# Patient Record
Sex: Female | Born: 1980 | Race: White | Hispanic: No | Smoking: Former smoker
Health system: Southern US, Community
[De-identification: ages and names within clinical notes are randomized; demographics above are authoritative.]

## PROBLEM LIST (undated history)

## (undated) DIAGNOSIS — E669 Obesity, unspecified: Secondary | ICD-10-CM

## (undated) DIAGNOSIS — E079 Disorder of thyroid, unspecified: Secondary | ICD-10-CM

## (undated) DIAGNOSIS — R87629 Unspecified abnormal cytological findings in specimens from vagina: Secondary | ICD-10-CM

## (undated) DIAGNOSIS — F32A Depression, unspecified: Secondary | ICD-10-CM

## (undated) DIAGNOSIS — F329 Major depressive disorder, single episode, unspecified: Secondary | ICD-10-CM

## (undated) DIAGNOSIS — M1990 Unspecified osteoarthritis, unspecified site: Secondary | ICD-10-CM

## (undated) DIAGNOSIS — M549 Dorsalgia, unspecified: Secondary | ICD-10-CM

## (undated) DIAGNOSIS — N390 Urinary tract infection, site not specified: Secondary | ICD-10-CM

## (undated) HISTORY — PX: NASAL SINUS SURGERY: SHX719

## (undated) HISTORY — DX: Urinary tract infection, site not specified: N39.0

## (undated) HISTORY — PX: MOUTH SURGERY: SHX715

## (undated) HISTORY — DX: Unspecified abnormal cytological findings in specimens from vagina: R87.629

## (undated) HISTORY — PX: INCISION AND DRAINAGE ABSCESS: SHX5864

## (undated) HISTORY — DX: Obesity, unspecified: E66.9

## (undated) HISTORY — PX: TUBAL LIGATION: SHX77

## (undated) HISTORY — PX: ADENOIDECTOMY: SUR15

## (undated) HISTORY — PX: TONSILLECTOMY: SUR1361

## (undated) HISTORY — DX: Major depressive disorder, single episode, unspecified: F32.9

## (undated) HISTORY — DX: Depression, unspecified: F32.A

---
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
## 2000-09-11 ENCOUNTER — Other Ambulatory Visit: Admission: RE | Admit: 2000-09-11 | Discharge: 2000-09-11 | Payer: Self-pay | Admitting: Obstetrics

## 2000-09-11 ENCOUNTER — Encounter (INDEPENDENT_AMBULATORY_CARE_PROVIDER_SITE_OTHER): Payer: Self-pay

## 2002-05-21 ENCOUNTER — Emergency Department (HOSPITAL_COMMUNITY): Admission: EM | Admit: 2002-05-21 | Discharge: 2002-05-21 | Payer: Self-pay

## 2002-09-13 ENCOUNTER — Encounter: Admission: RE | Admit: 2002-09-13 | Discharge: 2002-09-13 | Payer: Self-pay | Admitting: Family Medicine

## 2002-10-10 ENCOUNTER — Encounter: Admission: RE | Admit: 2002-10-10 | Discharge: 2002-10-10 | Payer: Self-pay | Admitting: Family Medicine

## 2002-10-21 ENCOUNTER — Encounter: Admission: RE | Admit: 2002-10-21 | Discharge: 2002-10-21 | Payer: Self-pay | Admitting: Sports Medicine

## 2002-11-01 ENCOUNTER — Encounter: Payer: Self-pay | Admitting: *Deleted

## 2002-11-01 ENCOUNTER — Inpatient Hospital Stay (HOSPITAL_COMMUNITY): Admission: AD | Admit: 2002-11-01 | Discharge: 2002-11-01 | Payer: Self-pay | Admitting: *Deleted

## 2002-11-08 ENCOUNTER — Encounter: Admission: RE | Admit: 2002-11-08 | Discharge: 2002-11-08 | Payer: Self-pay | Admitting: Family Medicine

## 2003-01-09 ENCOUNTER — Encounter: Admission: RE | Admit: 2003-01-09 | Discharge: 2003-01-09 | Payer: Self-pay | Admitting: Family Medicine

## 2003-02-01 ENCOUNTER — Other Ambulatory Visit: Admission: RE | Admit: 2003-02-01 | Discharge: 2003-02-01 | Payer: Self-pay | Admitting: Family Medicine

## 2003-02-01 ENCOUNTER — Encounter (INDEPENDENT_AMBULATORY_CARE_PROVIDER_SITE_OTHER): Payer: Self-pay | Admitting: *Deleted

## 2003-02-01 ENCOUNTER — Encounter: Admission: RE | Admit: 2003-02-01 | Discharge: 2003-02-01 | Payer: Self-pay | Admitting: Family Medicine

## 2003-02-12 ENCOUNTER — Emergency Department (HOSPITAL_COMMUNITY): Admission: EM | Admit: 2003-02-12 | Discharge: 2003-02-12 | Payer: Self-pay | Admitting: Emergency Medicine

## 2003-03-28 ENCOUNTER — Encounter: Admission: RE | Admit: 2003-03-28 | Discharge: 2003-03-28 | Payer: Self-pay | Admitting: Family Medicine

## 2003-04-19 ENCOUNTER — Encounter: Admission: RE | Admit: 2003-04-19 | Discharge: 2003-04-19 | Payer: Self-pay | Admitting: Family Medicine

## 2003-04-27 ENCOUNTER — Encounter: Admission: RE | Admit: 2003-04-27 | Discharge: 2003-04-27 | Payer: Self-pay | Admitting: Family Medicine

## 2003-08-10 ENCOUNTER — Other Ambulatory Visit: Admission: RE | Admit: 2003-08-10 | Discharge: 2003-08-10 | Payer: Self-pay | Admitting: *Deleted

## 2003-08-10 ENCOUNTER — Encounter: Admission: RE | Admit: 2003-08-10 | Discharge: 2003-08-10 | Payer: Self-pay | Admitting: Family Medicine

## 2003-08-18 ENCOUNTER — Encounter: Admission: RE | Admit: 2003-08-18 | Discharge: 2003-08-18 | Payer: Self-pay | Admitting: Family Medicine

## 2003-08-22 ENCOUNTER — Encounter: Admission: RE | Admit: 2003-08-22 | Discharge: 2003-08-22 | Payer: Self-pay | Admitting: Family Medicine

## 2003-11-22 ENCOUNTER — Ambulatory Visit: Payer: Self-pay | Admitting: Family Medicine

## 2003-11-22 ENCOUNTER — Encounter (INDEPENDENT_AMBULATORY_CARE_PROVIDER_SITE_OTHER): Payer: Self-pay | Admitting: Specialist

## 2003-11-22 ENCOUNTER — Other Ambulatory Visit: Admission: RE | Admit: 2003-11-22 | Discharge: 2003-11-22 | Payer: Self-pay | Admitting: Family Medicine

## 2003-12-08 ENCOUNTER — Ambulatory Visit: Payer: Self-pay | Admitting: Sports Medicine

## 2004-04-09 ENCOUNTER — Ambulatory Visit: Payer: Self-pay | Admitting: Sports Medicine

## 2004-06-19 ENCOUNTER — Inpatient Hospital Stay (HOSPITAL_COMMUNITY): Admission: AD | Admit: 2004-06-19 | Discharge: 2004-06-19 | Payer: Self-pay | Admitting: Obstetrics and Gynecology

## 2004-09-20 ENCOUNTER — Emergency Department (HOSPITAL_COMMUNITY): Admission: EM | Admit: 2004-09-20 | Discharge: 2004-09-20 | Payer: Self-pay | Admitting: Emergency Medicine

## 2004-12-25 ENCOUNTER — Inpatient Hospital Stay (HOSPITAL_COMMUNITY): Admission: AD | Admit: 2004-12-25 | Discharge: 2004-12-25 | Payer: Self-pay | Admitting: Obstetrics and Gynecology

## 2005-01-05 ENCOUNTER — Inpatient Hospital Stay (HOSPITAL_COMMUNITY): Admission: AD | Admit: 2005-01-05 | Discharge: 2005-01-05 | Payer: Self-pay | Admitting: *Deleted

## 2005-03-03 HISTORY — PX: CERVICAL BIOPSY  W/ LOOP ELECTRODE EXCISION: SUR135

## 2005-03-10 ENCOUNTER — Inpatient Hospital Stay (HOSPITAL_COMMUNITY): Admission: AD | Admit: 2005-03-10 | Discharge: 2005-03-10 | Payer: Self-pay | Admitting: Family Medicine

## 2005-03-12 ENCOUNTER — Ambulatory Visit: Payer: Self-pay | Admitting: Family Medicine

## 2005-03-21 ENCOUNTER — Ambulatory Visit: Payer: Self-pay | Admitting: Family Medicine

## 2005-03-21 ENCOUNTER — Encounter (INDEPENDENT_AMBULATORY_CARE_PROVIDER_SITE_OTHER): Payer: Self-pay | Admitting: Specialist

## 2005-03-21 ENCOUNTER — Other Ambulatory Visit: Admission: RE | Admit: 2005-03-21 | Discharge: 2005-03-21 | Payer: Self-pay | Admitting: Family Medicine

## 2005-03-26 ENCOUNTER — Ambulatory Visit (HOSPITAL_COMMUNITY): Admission: RE | Admit: 2005-03-26 | Discharge: 2005-03-26 | Payer: Self-pay | Admitting: Family Medicine

## 2005-05-02 ENCOUNTER — Ambulatory Visit: Payer: Self-pay | Admitting: Family Medicine

## 2005-05-05 ENCOUNTER — Ambulatory Visit: Payer: Self-pay | Admitting: Family Medicine

## 2005-05-13 ENCOUNTER — Ambulatory Visit: Payer: Self-pay | Admitting: Family Medicine

## 2005-05-13 ENCOUNTER — Inpatient Hospital Stay (HOSPITAL_COMMUNITY): Admission: AD | Admit: 2005-05-13 | Discharge: 2005-05-13 | Payer: Self-pay | Admitting: Obstetrics and Gynecology

## 2005-05-27 ENCOUNTER — Ambulatory Visit: Payer: Self-pay | Admitting: Family Medicine

## 2005-05-30 ENCOUNTER — Ambulatory Visit: Payer: Self-pay | Admitting: Family Medicine

## 2005-06-06 ENCOUNTER — Ambulatory Visit: Payer: Self-pay | Admitting: Family Medicine

## 2005-06-19 ENCOUNTER — Inpatient Hospital Stay (HOSPITAL_COMMUNITY): Admission: AD | Admit: 2005-06-19 | Discharge: 2005-06-22 | Payer: Self-pay | Admitting: Family Medicine

## 2005-06-24 ENCOUNTER — Inpatient Hospital Stay (HOSPITAL_COMMUNITY): Admission: AD | Admit: 2005-06-24 | Discharge: 2005-06-24 | Payer: Self-pay | Admitting: *Deleted

## 2005-06-26 ENCOUNTER — Ambulatory Visit: Payer: Self-pay | Admitting: Gynecology

## 2005-07-03 ENCOUNTER — Ambulatory Visit: Payer: Self-pay | Admitting: *Deleted

## 2005-07-03 ENCOUNTER — Ambulatory Visit (HOSPITAL_COMMUNITY): Admission: RE | Admit: 2005-07-03 | Discharge: 2005-07-03 | Payer: Self-pay | Admitting: *Deleted

## 2005-07-17 ENCOUNTER — Ambulatory Visit: Payer: Self-pay | Admitting: *Deleted

## 2005-07-19 ENCOUNTER — Inpatient Hospital Stay (HOSPITAL_COMMUNITY): Admission: AD | Admit: 2005-07-19 | Discharge: 2005-07-20 | Payer: Self-pay | Admitting: *Deleted

## 2005-07-19 ENCOUNTER — Ambulatory Visit: Payer: Self-pay | Admitting: Certified Nurse Midwife

## 2005-07-24 ENCOUNTER — Ambulatory Visit: Payer: Self-pay | Admitting: Gynecology

## 2005-07-31 ENCOUNTER — Ambulatory Visit: Payer: Self-pay | Admitting: *Deleted

## 2005-07-31 ENCOUNTER — Ambulatory Visit: Payer: Self-pay | Admitting: Family Medicine

## 2005-07-31 ENCOUNTER — Inpatient Hospital Stay (HOSPITAL_COMMUNITY): Admission: AD | Admit: 2005-07-31 | Discharge: 2005-07-31 | Payer: Self-pay | Admitting: Family Medicine

## 2005-08-02 ENCOUNTER — Ambulatory Visit: Payer: Self-pay | Admitting: Certified Nurse Midwife

## 2005-08-02 ENCOUNTER — Inpatient Hospital Stay (HOSPITAL_COMMUNITY): Admission: AD | Admit: 2005-08-02 | Discharge: 2005-08-03 | Payer: Self-pay | Admitting: Obstetrics and Gynecology

## 2005-08-07 ENCOUNTER — Ambulatory Visit: Payer: Self-pay | Admitting: Family Medicine

## 2005-08-19 ENCOUNTER — Inpatient Hospital Stay (HOSPITAL_COMMUNITY): Admission: AD | Admit: 2005-08-19 | Discharge: 2005-08-19 | Payer: Self-pay | Admitting: Obstetrics and Gynecology

## 2005-08-19 ENCOUNTER — Ambulatory Visit: Payer: Self-pay | Admitting: *Deleted

## 2005-08-20 ENCOUNTER — Encounter (INDEPENDENT_AMBULATORY_CARE_PROVIDER_SITE_OTHER): Payer: Self-pay | Admitting: *Deleted

## 2005-08-20 ENCOUNTER — Inpatient Hospital Stay (HOSPITAL_COMMUNITY): Admission: AD | Admit: 2005-08-20 | Discharge: 2005-08-22 | Payer: Self-pay | Admitting: Gynecology

## 2005-08-20 ENCOUNTER — Ambulatory Visit: Payer: Self-pay | Admitting: Gynecology

## 2005-08-26 ENCOUNTER — Ambulatory Visit: Payer: Self-pay | Admitting: Obstetrics and Gynecology

## 2005-10-03 ENCOUNTER — Ambulatory Visit: Payer: Self-pay | Admitting: Sports Medicine

## 2005-10-03 ENCOUNTER — Other Ambulatory Visit: Admission: RE | Admit: 2005-10-03 | Discharge: 2005-10-03 | Payer: Self-pay | Admitting: Family Medicine

## 2005-10-07 ENCOUNTER — Encounter (INDEPENDENT_AMBULATORY_CARE_PROVIDER_SITE_OTHER): Payer: Self-pay | Admitting: *Deleted

## 2005-10-28 ENCOUNTER — Ambulatory Visit: Payer: Self-pay | Admitting: Family Medicine

## 2006-01-08 ENCOUNTER — Ambulatory Visit: Payer: Self-pay | Admitting: Sports Medicine

## 2006-01-20 ENCOUNTER — Ambulatory Visit: Payer: Self-pay | Admitting: Family Medicine

## 2006-02-04 ENCOUNTER — Ambulatory Visit: Payer: Self-pay | Admitting: Obstetrics & Gynecology

## 2006-02-20 ENCOUNTER — Other Ambulatory Visit: Admission: RE | Admit: 2006-02-20 | Discharge: 2006-02-20 | Payer: Self-pay | Admitting: *Deleted

## 2006-02-20 ENCOUNTER — Ambulatory Visit: Payer: Self-pay | Admitting: Gynecology

## 2006-02-20 ENCOUNTER — Encounter (INDEPENDENT_AMBULATORY_CARE_PROVIDER_SITE_OTHER): Payer: Self-pay | Admitting: Specialist

## 2006-03-06 ENCOUNTER — Encounter (INDEPENDENT_AMBULATORY_CARE_PROVIDER_SITE_OTHER): Payer: Self-pay | Admitting: Family Medicine

## 2006-03-06 ENCOUNTER — Ambulatory Visit: Payer: Self-pay | Admitting: Family Medicine

## 2006-03-11 ENCOUNTER — Ambulatory Visit: Payer: Self-pay | Admitting: Family Medicine

## 2006-03-26 ENCOUNTER — Ambulatory Visit: Payer: Self-pay | Admitting: Psychology

## 2006-04-02 ENCOUNTER — Ambulatory Visit: Payer: Self-pay | Admitting: Psychology

## 2006-04-22 ENCOUNTER — Ambulatory Visit: Payer: Self-pay | Admitting: Family Medicine

## 2006-04-28 ENCOUNTER — Ambulatory Visit: Payer: Self-pay | Admitting: Family Medicine

## 2006-04-30 DIAGNOSIS — K210 Gastro-esophageal reflux disease with esophagitis: Secondary | ICD-10-CM

## 2006-04-30 DIAGNOSIS — E669 Obesity, unspecified: Secondary | ICD-10-CM | POA: Insufficient documentation

## 2006-04-30 DIAGNOSIS — E049 Nontoxic goiter, unspecified: Secondary | ICD-10-CM | POA: Insufficient documentation

## 2006-04-30 DIAGNOSIS — F319 Bipolar disorder, unspecified: Secondary | ICD-10-CM | POA: Insufficient documentation

## 2006-05-01 ENCOUNTER — Encounter (INDEPENDENT_AMBULATORY_CARE_PROVIDER_SITE_OTHER): Payer: Self-pay | Admitting: *Deleted

## 2006-05-06 ENCOUNTER — Ambulatory Visit: Payer: Self-pay | Admitting: Family Medicine

## 2006-05-13 ENCOUNTER — Ambulatory Visit: Payer: Self-pay | Admitting: Sports Medicine

## 2006-05-13 ENCOUNTER — Encounter (INDEPENDENT_AMBULATORY_CARE_PROVIDER_SITE_OTHER): Payer: Self-pay | Admitting: Family Medicine

## 2006-05-13 ENCOUNTER — Encounter: Payer: Self-pay | Admitting: *Deleted

## 2006-05-14 ENCOUNTER — Ambulatory Visit: Payer: Self-pay | Admitting: Family Medicine

## 2006-06-03 ENCOUNTER — Ambulatory Visit: Payer: Self-pay | Admitting: Family Medicine

## 2006-06-11 ENCOUNTER — Ambulatory Visit: Payer: Self-pay | Admitting: Psychology

## 2006-07-09 ENCOUNTER — Ambulatory Visit: Payer: Self-pay | Admitting: Psychology

## 2006-09-15 ENCOUNTER — Telehealth: Payer: Self-pay | Admitting: *Deleted

## 2006-09-18 IMAGING — US US OB LIMITED
1 series · 13 of 18 positions shown · non-contrast
Comparison: none

CLINICAL DATA: 36 weeks estimated gestational age for NST.  Evaluate amniotic fluid volume and fetal well-being.

[Series 1: us ob limited · 0.47mm/px · 13 of 18 slices shown]
[im 1/18]
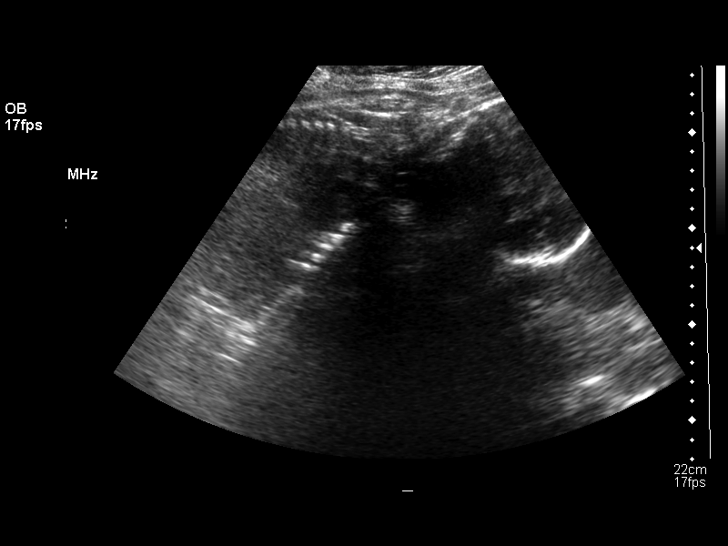
[im 3/18]
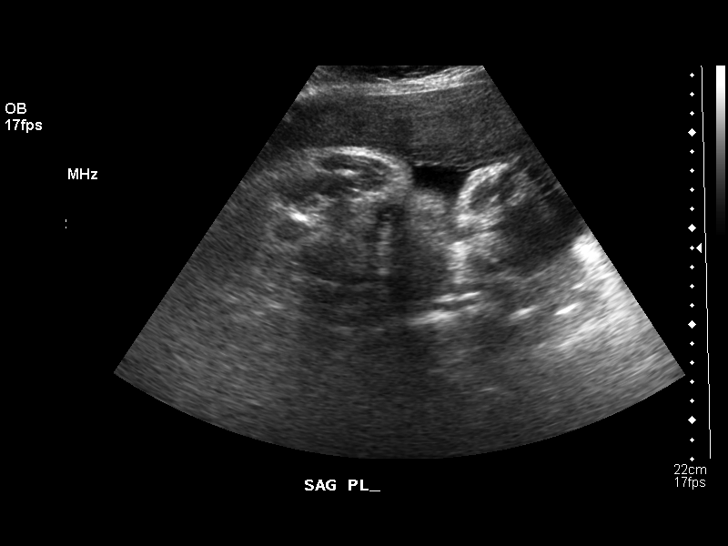
[im 4/18]
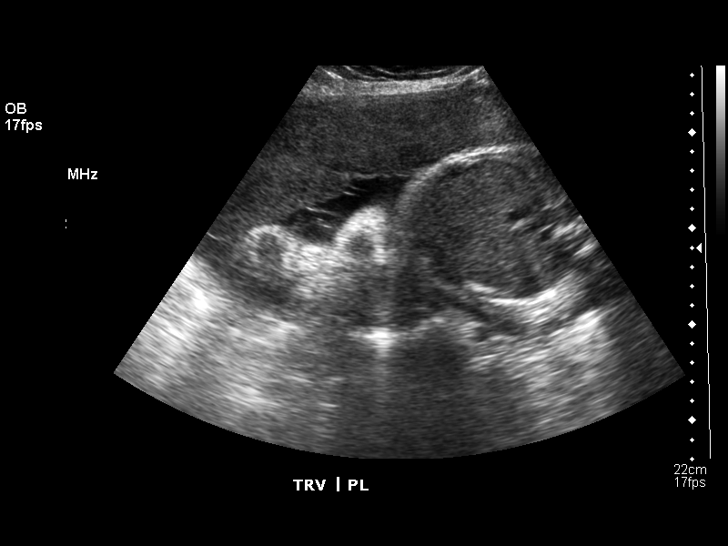
[im 5/18]
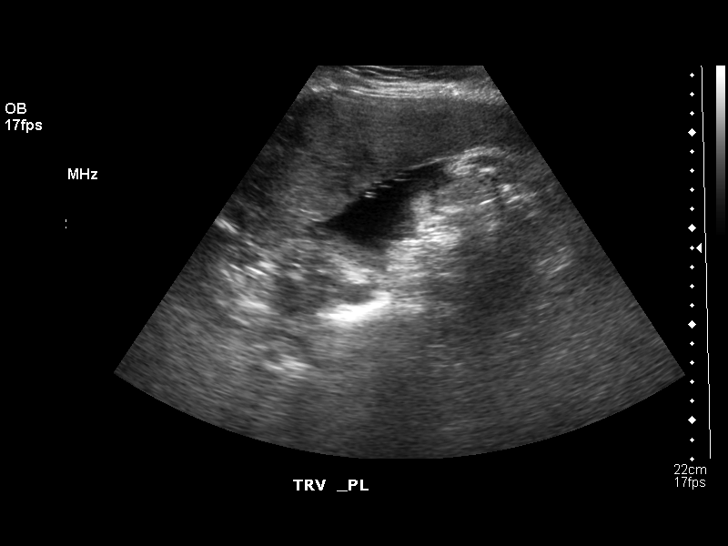
[im 7/18]
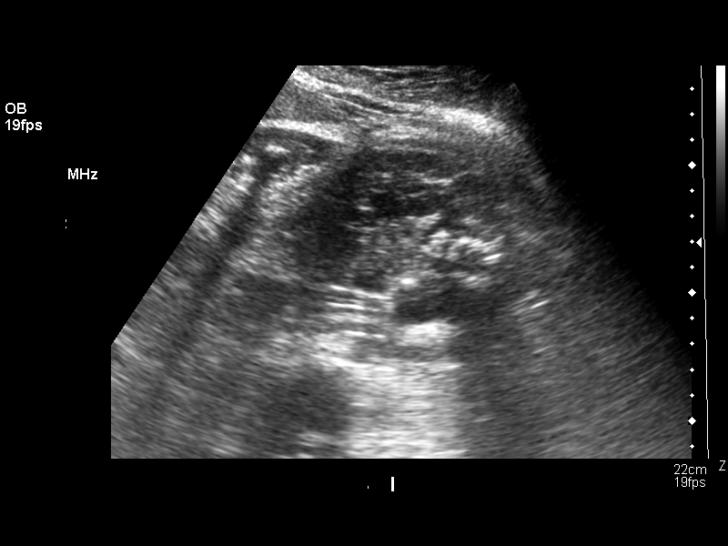
[im 8/18]
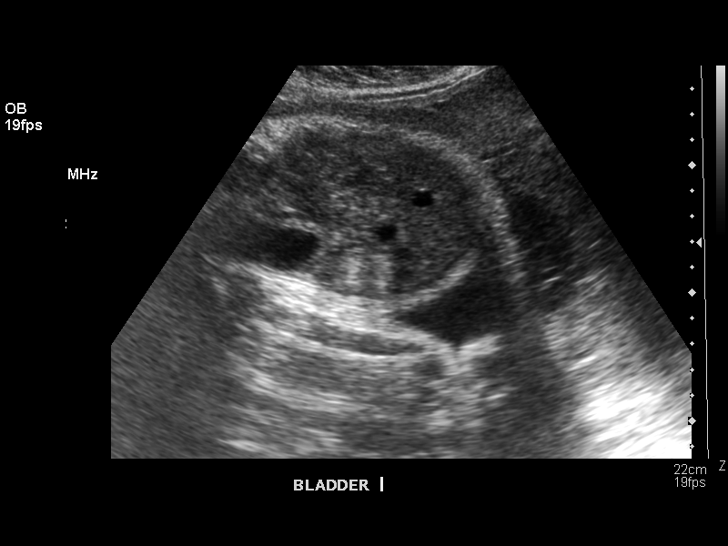
[im 10/18]
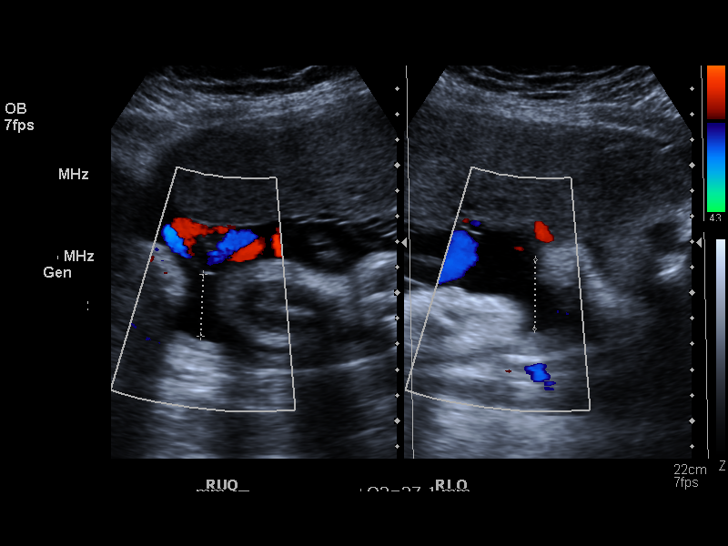
[im 11/18]
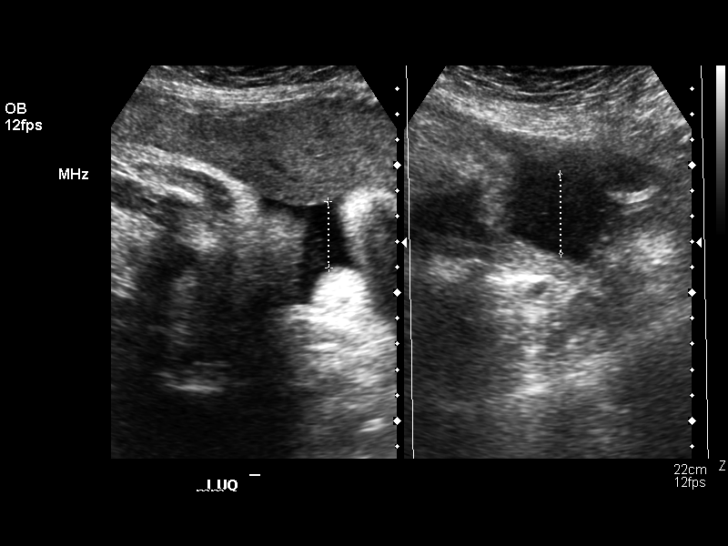
[im 12/18]
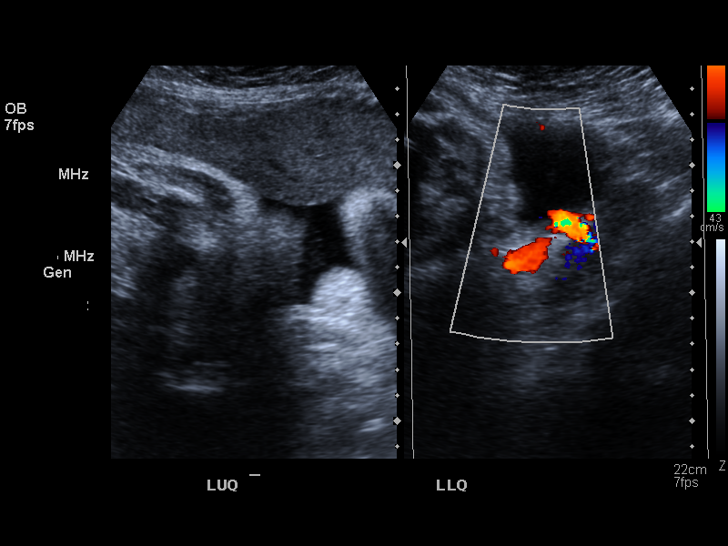
[im 14/18]
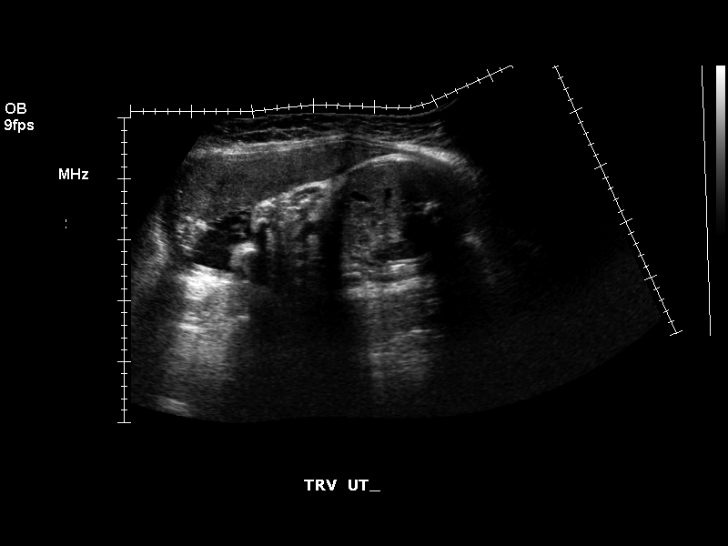
[im 15/18]
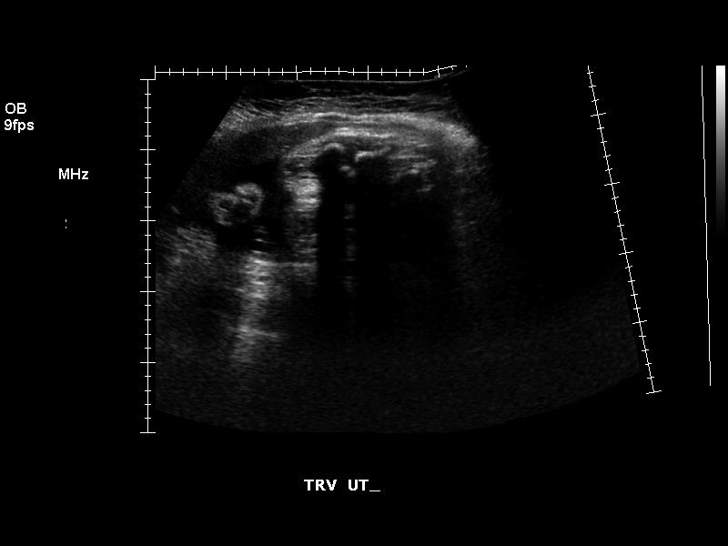
[im 16/18]
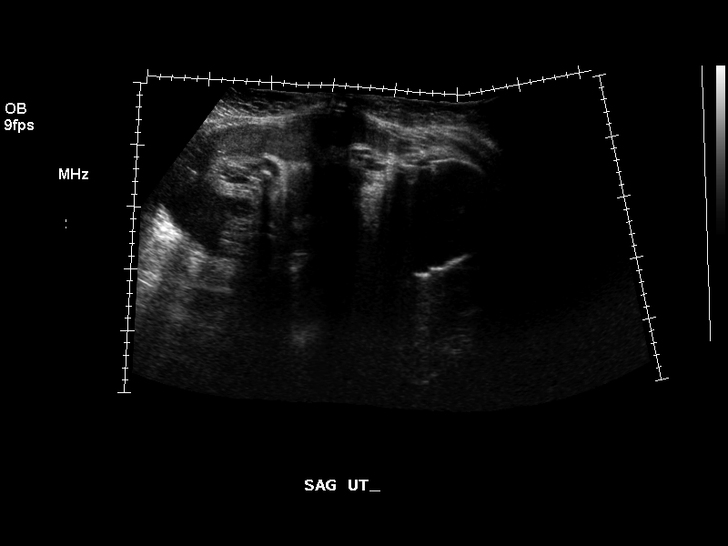
[im 18/18]
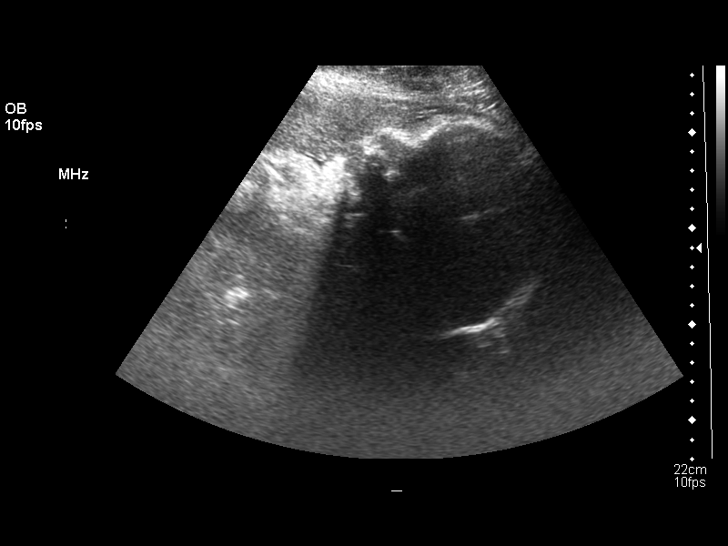

[13 of 18 positions shown; findings below may reference images not displayed]

LIMITED OBSTETRICAL ULTRASOUND:
 Number of Fetuses: 1
 Heart Rate:  136
 Movement:  Yes
 Breathing:  Yes
 Presentation:  Cephalic
 Placental Location:  Anterior
 Grade: I
 Previa: No
 Amniotic Fluid (Subjective):  Normal
 Amniotic Fluid (Objective):  10.8 cm AFI (5th -95th%ile = 7.3 ? 24.4 cm for 37 wks)

 Fetal measurements and complete anatomic evaluation were not requested.  The following fetal anatomy was visualized during this exam: Stomach, kidneys, and bladder. 

 MATERNAL UTERINE AND ADNEXAL FINDINGS
 Cervix: Not evaluated. 

 BIOPHYSICAL PROFILE

 Movement: 2      Time:  16 minutes
 Breathing:  2
 Tone:  2
 Amniotic Fluid:  2

 Total Score:  8
IMPRESSION: 1.  Subjectively and quantitatively normal amniotic fluid volume.
 2.  Biophysical profile score [DATE].
 3.  No late developing fetal anatomic abnormalities are identified associated with the stomach, kidneys, or bladder.  The lateral ventricles and four chamber heart view could not be seen with clarity due to advanced gestational age combined with positioning on today?s exam. 
 4.  The patient was sent with a preliminary report to the clinic immediately following this exam.

## 2006-09-23 ENCOUNTER — Telehealth: Payer: Self-pay | Admitting: *Deleted

## 2006-09-24 ENCOUNTER — Ambulatory Visit: Payer: Self-pay | Admitting: Family Medicine

## 2006-09-24 ENCOUNTER — Encounter: Payer: Self-pay | Admitting: Family Medicine

## 2006-09-24 LAB — CONVERTED CEMR LAB
Chlamydia, DNA Probe: NEGATIVE
GC Probe Amp, Genital: NEGATIVE

## 2006-11-03 ENCOUNTER — Telehealth (INDEPENDENT_AMBULATORY_CARE_PROVIDER_SITE_OTHER): Payer: Self-pay | Admitting: Family Medicine

## 2006-11-10 ENCOUNTER — Telehealth: Payer: Self-pay | Admitting: *Deleted

## 2006-12-02 ENCOUNTER — Ambulatory Visit: Payer: Self-pay | Admitting: Family Medicine

## 2006-12-02 LAB — CONVERTED CEMR LAB: Pap Smear: NORMAL

## 2006-12-17 ENCOUNTER — Telehealth: Payer: Self-pay | Admitting: *Deleted

## 2006-12-30 ENCOUNTER — Encounter (INDEPENDENT_AMBULATORY_CARE_PROVIDER_SITE_OTHER): Payer: Self-pay | Admitting: Family Medicine

## 2006-12-30 ENCOUNTER — Ambulatory Visit: Payer: Self-pay | Admitting: Family Medicine

## 2006-12-30 ENCOUNTER — Other Ambulatory Visit: Admission: RE | Admit: 2006-12-30 | Discharge: 2006-12-30 | Payer: Self-pay | Admitting: Family Medicine

## 2006-12-30 LAB — CONVERTED CEMR LAB
Blood in Urine, dipstick: NEGATIVE
Ketones, urine, test strip: NEGATIVE
Nitrite: NEGATIVE
Urobilinogen, UA: 0.2

## 2006-12-31 LAB — CONVERTED CEMR LAB
GC Probe Amp, Genital: NEGATIVE
TSH: 2.391 microintl units/mL (ref 0.350–5.50)

## 2007-01-01 ENCOUNTER — Telehealth (INDEPENDENT_AMBULATORY_CARE_PROVIDER_SITE_OTHER): Payer: Self-pay | Admitting: *Deleted

## 2007-01-06 ENCOUNTER — Encounter (INDEPENDENT_AMBULATORY_CARE_PROVIDER_SITE_OTHER): Payer: Self-pay | Admitting: Family Medicine

## 2007-01-24 ENCOUNTER — Telehealth: Payer: Self-pay | Admitting: Family Medicine

## 2007-01-26 ENCOUNTER — Encounter (INDEPENDENT_AMBULATORY_CARE_PROVIDER_SITE_OTHER): Payer: Self-pay | Admitting: Family Medicine

## 2007-01-26 ENCOUNTER — Telehealth: Payer: Self-pay | Admitting: *Deleted

## 2007-01-26 ENCOUNTER — Ambulatory Visit: Payer: Self-pay | Admitting: Sports Medicine

## 2007-01-26 ENCOUNTER — Encounter: Payer: Self-pay | Admitting: *Deleted

## 2007-01-26 DIAGNOSIS — N760 Acute vaginitis: Secondary | ICD-10-CM | POA: Insufficient documentation

## 2007-01-26 LAB — CONVERTED CEMR LAB
Glucose, Urine, Semiquant: NEGATIVE
Specific Gravity, Urine: 1.025
pH: 6

## 2007-04-13 ENCOUNTER — Emergency Department (HOSPITAL_COMMUNITY): Admission: EM | Admit: 2007-04-13 | Discharge: 2007-04-13 | Payer: Self-pay | Admitting: Family Medicine

## 2007-04-14 ENCOUNTER — Encounter (INDEPENDENT_AMBULATORY_CARE_PROVIDER_SITE_OTHER): Payer: Self-pay | Admitting: Family Medicine

## 2007-04-14 ENCOUNTER — Ambulatory Visit: Payer: Self-pay | Admitting: Family Medicine

## 2007-04-19 ENCOUNTER — Ambulatory Visit: Payer: Self-pay | Admitting: Family Medicine

## 2007-04-19 DIAGNOSIS — F410 Panic disorder [episodic paroxysmal anxiety] without agoraphobia: Secondary | ICD-10-CM | POA: Insufficient documentation

## 2007-04-24 ENCOUNTER — Encounter: Admission: RE | Admit: 2007-04-24 | Discharge: 2007-04-24 | Payer: Self-pay | Admitting: Sports Medicine

## 2007-04-26 ENCOUNTER — Telehealth: Payer: Self-pay | Admitting: *Deleted

## 2007-04-26 DIAGNOSIS — S83509A Sprain of unspecified cruciate ligament of unspecified knee, initial encounter: Secondary | ICD-10-CM | POA: Insufficient documentation

## 2007-04-27 ENCOUNTER — Encounter: Payer: Self-pay | Admitting: *Deleted

## 2007-04-28 ENCOUNTER — Encounter (INDEPENDENT_AMBULATORY_CARE_PROVIDER_SITE_OTHER): Payer: Self-pay | Admitting: Family Medicine

## 2007-05-05 ENCOUNTER — Encounter: Admission: RE | Admit: 2007-05-05 | Discharge: 2007-06-24 | Payer: Self-pay | Admitting: Orthopedic Surgery

## 2007-05-26 ENCOUNTER — Encounter (INDEPENDENT_AMBULATORY_CARE_PROVIDER_SITE_OTHER): Payer: Self-pay | Admitting: Family Medicine

## 2007-07-06 ENCOUNTER — Other Ambulatory Visit: Admission: RE | Admit: 2007-07-06 | Discharge: 2007-07-06 | Payer: Self-pay | Admitting: Family Medicine

## 2007-07-06 ENCOUNTER — Ambulatory Visit: Payer: Self-pay | Admitting: Family Medicine

## 2007-07-06 ENCOUNTER — Encounter (INDEPENDENT_AMBULATORY_CARE_PROVIDER_SITE_OTHER): Payer: Self-pay | Admitting: Family Medicine

## 2007-07-06 DIAGNOSIS — J309 Allergic rhinitis, unspecified: Secondary | ICD-10-CM | POA: Insufficient documentation

## 2007-07-06 LAB — CONVERTED CEMR LAB
Chlamydia, DNA Probe: NEGATIVE
GC Probe Amp, Genital: NEGATIVE

## 2007-07-12 LAB — CONVERTED CEMR LAB: Pap Smear: NORMAL

## 2007-08-31 ENCOUNTER — Encounter (INDEPENDENT_AMBULATORY_CARE_PROVIDER_SITE_OTHER): Payer: Self-pay | Admitting: Family Medicine

## 2007-09-09 ENCOUNTER — Telehealth: Payer: Self-pay | Admitting: *Deleted

## 2007-09-09 ENCOUNTER — Ambulatory Visit: Payer: Self-pay | Admitting: Family Medicine

## 2007-09-30 ENCOUNTER — Telehealth: Payer: Self-pay | Admitting: *Deleted

## 2007-11-09 ENCOUNTER — Ambulatory Visit: Payer: Self-pay | Admitting: Family Medicine

## 2007-11-17 ENCOUNTER — Ambulatory Visit: Payer: Self-pay | Admitting: Family Medicine

## 2007-12-23 ENCOUNTER — Telehealth (INDEPENDENT_AMBULATORY_CARE_PROVIDER_SITE_OTHER): Payer: Self-pay | Admitting: Family Medicine

## 2007-12-24 ENCOUNTER — Ambulatory Visit: Payer: Self-pay | Admitting: Family Medicine

## 2007-12-24 ENCOUNTER — Encounter: Payer: Self-pay | Admitting: Family Medicine

## 2007-12-24 LAB — CONVERTED CEMR LAB
Hemoglobin: 10.6 g/dL — ABNORMAL LOW (ref 12.0–15.0)
RBC: 4.53 M/uL (ref 3.87–5.11)
RDW: 16.2 % — ABNORMAL HIGH (ref 11.5–15.5)

## 2007-12-28 ENCOUNTER — Ambulatory Visit: Payer: Self-pay | Admitting: Family Medicine

## 2007-12-28 DIAGNOSIS — D508 Other iron deficiency anemias: Secondary | ICD-10-CM | POA: Insufficient documentation

## 2008-01-17 ENCOUNTER — Encounter (INDEPENDENT_AMBULATORY_CARE_PROVIDER_SITE_OTHER): Payer: Self-pay | Admitting: Family Medicine

## 2008-01-17 ENCOUNTER — Ambulatory Visit: Payer: Self-pay | Admitting: Family Medicine

## 2008-01-17 DIAGNOSIS — N739 Female pelvic inflammatory disease, unspecified: Secondary | ICD-10-CM | POA: Insufficient documentation

## 2008-01-17 DIAGNOSIS — F411 Generalized anxiety disorder: Secondary | ICD-10-CM | POA: Insufficient documentation

## 2008-01-17 DIAGNOSIS — N871 Moderate cervical dysplasia: Secondary | ICD-10-CM

## 2008-01-18 LAB — CONVERTED CEMR LAB
HDL: 46 mg/dL (ref >39)
LDL Cholesterol: 94 mg/dL (ref 0–99)
Triglycerides: 104 mg/dL (ref <150)

## 2008-01-31 ENCOUNTER — Ambulatory Visit: Payer: Self-pay | Admitting: Family Medicine

## 2008-01-31 ENCOUNTER — Encounter (INDEPENDENT_AMBULATORY_CARE_PROVIDER_SITE_OTHER): Payer: Self-pay | Admitting: Family Medicine

## 2008-02-02 ENCOUNTER — Ambulatory Visit: Payer: Self-pay | Admitting: Family Medicine

## 2008-03-13 ENCOUNTER — Ambulatory Visit: Payer: Self-pay | Admitting: Family Medicine

## 2008-03-13 ENCOUNTER — Encounter (INDEPENDENT_AMBULATORY_CARE_PROVIDER_SITE_OTHER): Payer: Self-pay | Admitting: Family Medicine

## 2008-03-14 ENCOUNTER — Encounter (INDEPENDENT_AMBULATORY_CARE_PROVIDER_SITE_OTHER): Payer: Self-pay | Admitting: Family Medicine

## 2008-03-14 LAB — CONVERTED CEMR LAB: Hepatitis B Surface Ag: NEGATIVE

## 2008-03-22 ENCOUNTER — Ambulatory Visit: Payer: Self-pay | Admitting: Psychology

## 2008-03-23 ENCOUNTER — Encounter (INDEPENDENT_AMBULATORY_CARE_PROVIDER_SITE_OTHER): Payer: Self-pay | Admitting: Family Medicine

## 2008-03-23 ENCOUNTER — Telehealth: Payer: Self-pay | Admitting: *Deleted

## 2008-03-23 ENCOUNTER — Ambulatory Visit: Payer: Self-pay | Admitting: Family Medicine

## 2008-03-23 DIAGNOSIS — M542 Cervicalgia: Secondary | ICD-10-CM

## 2008-03-24 ENCOUNTER — Encounter: Admission: RE | Admit: 2008-03-24 | Discharge: 2008-03-24 | Payer: Self-pay | Admitting: Family Medicine

## 2008-04-06 ENCOUNTER — Ambulatory Visit: Payer: Self-pay | Admitting: Family Medicine

## 2008-04-06 DIAGNOSIS — G44209 Tension-type headache, unspecified, not intractable: Secondary | ICD-10-CM | POA: Insufficient documentation

## 2008-04-12 ENCOUNTER — Telehealth (INDEPENDENT_AMBULATORY_CARE_PROVIDER_SITE_OTHER): Payer: Self-pay | Admitting: *Deleted

## 2008-05-03 ENCOUNTER — Telehealth: Payer: Self-pay | Admitting: Psychology

## 2008-05-03 ENCOUNTER — Ambulatory Visit: Payer: Self-pay | Admitting: Family Medicine

## 2008-08-09 ENCOUNTER — Other Ambulatory Visit: Admission: RE | Admit: 2008-08-09 | Discharge: 2008-08-09 | Payer: Self-pay | Admitting: Family Medicine

## 2008-08-09 ENCOUNTER — Encounter (INDEPENDENT_AMBULATORY_CARE_PROVIDER_SITE_OTHER): Payer: Self-pay | Admitting: Family Medicine

## 2008-08-09 ENCOUNTER — Ambulatory Visit: Payer: Self-pay | Admitting: Family Medicine

## 2008-08-09 DIAGNOSIS — M25519 Pain in unspecified shoulder: Secondary | ICD-10-CM

## 2008-08-10 LAB — CONVERTED CEMR LAB
Ferritin: 3 ng/mL — ABNORMAL LOW (ref 10–291)
MCHC: 30.9 g/dL (ref 30.0–36.0)
MCV: 84.9 fL (ref 78.0–100.0)
Platelets: 180 10*3/uL (ref 150–400)
RDW: 14.4 % (ref 11.5–15.5)
WBC: 5.6 10*3/uL (ref 4.0–10.5)

## 2008-08-29 ENCOUNTER — Telehealth (INDEPENDENT_AMBULATORY_CARE_PROVIDER_SITE_OTHER): Payer: Self-pay | Admitting: *Deleted

## 2008-08-29 DIAGNOSIS — H547 Unspecified visual loss: Secondary | ICD-10-CM

## 2008-08-30 ENCOUNTER — Encounter (INDEPENDENT_AMBULATORY_CARE_PROVIDER_SITE_OTHER): Payer: Self-pay | Admitting: Family Medicine

## 2008-09-04 ENCOUNTER — Emergency Department (HOSPITAL_COMMUNITY): Admission: EM | Admit: 2008-09-04 | Discharge: 2008-09-04 | Payer: Self-pay | Admitting: Emergency Medicine

## 2008-09-05 ENCOUNTER — Ambulatory Visit: Payer: Self-pay | Admitting: Family Medicine

## 2008-09-13 ENCOUNTER — Encounter: Payer: Self-pay | Admitting: Family Medicine

## 2008-10-04 ENCOUNTER — Encounter: Payer: Self-pay | Admitting: Family Medicine

## 2008-11-01 ENCOUNTER — Ambulatory Visit: Payer: Self-pay | Admitting: Family Medicine

## 2008-11-03 ENCOUNTER — Ambulatory Visit: Payer: Self-pay | Admitting: Family Medicine

## 2008-11-03 ENCOUNTER — Encounter: Admission: RE | Admit: 2008-11-03 | Discharge: 2008-11-03 | Payer: Self-pay | Admitting: Family Medicine

## 2008-11-08 ENCOUNTER — Ambulatory Visit: Payer: Self-pay | Admitting: Psychology

## 2008-11-08 ENCOUNTER — Ambulatory Visit: Payer: Self-pay

## 2008-11-14 ENCOUNTER — Encounter: Payer: Self-pay | Admitting: Family Medicine

## 2008-11-14 ENCOUNTER — Ambulatory Visit: Payer: Self-pay | Admitting: Family Medicine

## 2008-11-14 DIAGNOSIS — N925 Other specified irregular menstruation: Secondary | ICD-10-CM | POA: Insufficient documentation

## 2008-11-14 DIAGNOSIS — N949 Unspecified condition associated with female genital organs and menstrual cycle: Secondary | ICD-10-CM

## 2008-11-14 LAB — CONVERTED CEMR LAB

## 2008-11-15 ENCOUNTER — Ambulatory Visit: Payer: Self-pay | Admitting: Family Medicine

## 2008-11-15 ENCOUNTER — Encounter: Payer: Self-pay | Admitting: Psychology

## 2008-11-16 ENCOUNTER — Encounter: Payer: Self-pay | Admitting: Family Medicine

## 2008-11-16 ENCOUNTER — Encounter: Payer: Self-pay | Admitting: Psychology

## 2008-11-16 LAB — CONVERTED CEMR LAB: Lithium Lvl: 0.66 meq/L — ABNORMAL LOW (ref 0.80–1.40)

## 2008-11-17 ENCOUNTER — Telehealth: Payer: Self-pay | Admitting: *Deleted

## 2008-11-17 ENCOUNTER — Telehealth: Payer: Self-pay | Admitting: Psychology

## 2008-11-20 ENCOUNTER — Encounter: Payer: Self-pay | Admitting: Psychology

## 2008-11-20 ENCOUNTER — Telehealth: Payer: Self-pay | Admitting: Psychology

## 2008-11-20 ENCOUNTER — Ambulatory Visit: Payer: Self-pay | Admitting: Family Medicine

## 2008-11-21 LAB — CONVERTED CEMR LAB: Lithium Lvl: 0.76 meq/L — ABNORMAL LOW (ref 0.80–1.40)

## 2008-12-04 ENCOUNTER — Encounter: Payer: Self-pay | Admitting: Family Medicine

## 2008-12-04 ENCOUNTER — Ambulatory Visit: Payer: Self-pay | Admitting: Family Medicine

## 2008-12-04 ENCOUNTER — Ambulatory Visit: Payer: Self-pay | Admitting: Psychology

## 2008-12-04 LAB — CONVERTED CEMR LAB: Hep B S Ab: NEGATIVE

## 2008-12-06 ENCOUNTER — Ambulatory Visit: Payer: Self-pay | Admitting: Family Medicine

## 2008-12-07 ENCOUNTER — Encounter: Payer: Self-pay | Admitting: Psychology

## 2008-12-07 ENCOUNTER — Telehealth: Payer: Self-pay | Admitting: Psychology

## 2008-12-27 ENCOUNTER — Telehealth: Payer: Self-pay | Admitting: Psychology

## 2009-01-10 ENCOUNTER — Ambulatory Visit: Payer: Self-pay | Admitting: Psychology

## 2009-01-17 ENCOUNTER — Ambulatory Visit: Payer: Self-pay | Admitting: Family Medicine

## 2009-02-08 ENCOUNTER — Ambulatory Visit: Payer: Self-pay | Admitting: Psychology

## 2009-02-09 ENCOUNTER — Telehealth: Payer: Self-pay | Admitting: Psychology

## 2009-03-06 ENCOUNTER — Ambulatory Visit: Payer: Self-pay | Admitting: Family Medicine

## 2009-03-12 ENCOUNTER — Encounter: Admission: RE | Admit: 2009-03-12 | Discharge: 2009-03-12 | Payer: Self-pay | Admitting: Family Medicine

## 2009-03-13 ENCOUNTER — Ambulatory Visit: Payer: Self-pay | Admitting: Family Medicine

## 2009-03-22 ENCOUNTER — Ambulatory Visit: Payer: Self-pay | Admitting: Family Medicine

## 2009-03-22 ENCOUNTER — Encounter: Payer: Self-pay | Admitting: Family Medicine

## 2009-03-22 LAB — CONVERTED CEMR LAB
Albumin: 4.2 g/dL (ref 3.5–5.2)
CO2: 26 meq/L (ref 19–32)
Glucose, Bld: 80 mg/dL (ref 70–99)
Hep B C IgM: NEGATIVE
Hepatitis B Surface Ag: NEGATIVE
Potassium: 4.2 meq/L (ref 3.5–5.3)
RBC: 4.89 M/uL (ref 3.87–5.11)
Sodium: 139 meq/L (ref 135–145)
Total Protein: 7 g/dL (ref 6.0–8.3)
WBC: 11.1 10*3/uL — ABNORMAL HIGH (ref 4.0–10.5)

## 2009-03-23 ENCOUNTER — Telehealth: Payer: Self-pay | Admitting: Family Medicine

## 2009-03-23 ENCOUNTER — Ambulatory Visit: Payer: Self-pay | Admitting: Family Medicine

## 2009-03-26 ENCOUNTER — Encounter: Payer: Self-pay | Admitting: Family Medicine

## 2009-05-18 ENCOUNTER — Telehealth: Payer: Self-pay | Admitting: Family Medicine

## 2009-05-18 ENCOUNTER — Ambulatory Visit: Payer: Self-pay | Admitting: Family Medicine

## 2009-05-18 LAB — CONVERTED CEMR LAB
Glucose, Urine, Semiquant: NEGATIVE
Ketones, urine, test strip: NEGATIVE
Nitrite: NEGATIVE
Protein, U semiquant: 30
pH: 5.5

## 2009-06-08 ENCOUNTER — Encounter: Payer: Self-pay | Admitting: Family Medicine

## 2009-06-08 ENCOUNTER — Ambulatory Visit: Payer: Self-pay | Admitting: Family Medicine

## 2009-06-08 LAB — CONVERTED CEMR LAB
Nitrite: NEGATIVE
Specific Gravity, Urine: >=1.03
Whiff Test: NEGATIVE

## 2009-07-05 ENCOUNTER — Ambulatory Visit: Payer: Self-pay | Admitting: Family Medicine

## 2009-07-22 ENCOUNTER — Emergency Department (HOSPITAL_COMMUNITY): Admission: EM | Admit: 2009-07-22 | Discharge: 2009-07-22 | Payer: Self-pay | Admitting: Family Medicine

## 2009-07-23 ENCOUNTER — Ambulatory Visit: Payer: Self-pay | Admitting: Family Medicine

## 2009-08-14 ENCOUNTER — Ambulatory Visit: Payer: Self-pay | Admitting: Family Medicine

## 2009-08-14 DIAGNOSIS — M545 Low back pain: Secondary | ICD-10-CM

## 2009-08-21 ENCOUNTER — Ambulatory Visit: Payer: Self-pay | Admitting: Family Medicine

## 2009-08-21 DIAGNOSIS — R109 Unspecified abdominal pain: Secondary | ICD-10-CM

## 2009-08-21 LAB — CONVERTED CEMR LAB
Bilirubin Urine: NEGATIVE
Glucose, Urine, Semiquant: NEGATIVE
Ketones, urine, test strip: NEGATIVE
Protein, U semiquant: NEGATIVE
Urobilinogen, UA: 0.2
pH: 5

## 2009-08-27 ENCOUNTER — Encounter: Payer: Self-pay | Admitting: Family Medicine

## 2009-08-27 ENCOUNTER — Ambulatory Visit: Payer: Self-pay | Admitting: Family Medicine

## 2009-08-27 ENCOUNTER — Other Ambulatory Visit: Admission: RE | Admit: 2009-08-27 | Discharge: 2009-08-27 | Payer: Self-pay | Admitting: Family Medicine

## 2009-08-27 LAB — CONVERTED CEMR LAB
Chlamydia, DNA Probe: NEGATIVE
GC Probe Amp, Genital: NEGATIVE
Glucose, Urine, Semiquant: NEGATIVE
Nitrite: NEGATIVE
Specific Gravity, Urine: 1.015
pH: 6

## 2009-08-28 ENCOUNTER — Telehealth: Payer: Self-pay | Admitting: Family Medicine

## 2009-08-30 ENCOUNTER — Encounter: Payer: Self-pay | Admitting: Family Medicine

## 2009-08-30 LAB — CONVERTED CEMR LAB: Pap Smear: NEGATIVE

## 2009-08-31 ENCOUNTER — Ambulatory Visit: Payer: Self-pay | Admitting: Family Medicine

## 2009-09-28 ENCOUNTER — Ambulatory Visit: Payer: Self-pay | Admitting: Family Medicine

## 2009-10-01 ENCOUNTER — Telehealth: Payer: Self-pay | Admitting: Family Medicine

## 2009-10-09 ENCOUNTER — Telehealth: Payer: Self-pay | Admitting: Family Medicine

## 2009-11-27 ENCOUNTER — Telehealth: Payer: Self-pay | Admitting: Family Medicine

## 2009-11-28 ENCOUNTER — Ambulatory Visit: Payer: Self-pay | Admitting: Family Medicine

## 2009-11-28 ENCOUNTER — Encounter: Payer: Self-pay | Admitting: Family Medicine

## 2009-11-28 LAB — CONVERTED CEMR LAB
Chlamydia, DNA Probe: NEGATIVE
Whiff Test: POSITIVE

## 2009-11-29 ENCOUNTER — Telehealth: Payer: Self-pay | Admitting: *Deleted

## 2009-11-29 ENCOUNTER — Encounter: Payer: Self-pay | Admitting: Family Medicine

## 2009-12-05 ENCOUNTER — Telehealth: Payer: Self-pay | Admitting: Family Medicine

## 2009-12-18 ENCOUNTER — Ambulatory Visit: Payer: Self-pay | Admitting: Family Medicine

## 2010-02-21 ENCOUNTER — Encounter: Payer: Self-pay | Admitting: Family Medicine

## 2010-02-21 ENCOUNTER — Ambulatory Visit: Payer: Self-pay | Admitting: Family Medicine

## 2010-02-21 LAB — CONVERTED CEMR LAB
Chlamydia, DNA Probe: NEGATIVE
GC Probe Amp, Genital: NEGATIVE

## 2010-02-23 ENCOUNTER — Encounter: Payer: Self-pay | Admitting: Family Medicine

## 2010-03-12 ENCOUNTER — Ambulatory Visit: Admission: RE | Admit: 2010-03-12 | Discharge: 2010-03-12 | Payer: Self-pay | Source: Home / Self Care

## 2010-03-12 DIAGNOSIS — B070 Plantar wart: Secondary | ICD-10-CM | POA: Insufficient documentation

## 2010-03-13 ENCOUNTER — Ambulatory Visit: Admission: RE | Admit: 2010-03-13 | Discharge: 2010-03-13 | Payer: Self-pay | Source: Home / Self Care

## 2010-03-14 ENCOUNTER — Encounter: Payer: Self-pay | Admitting: Family Medicine

## 2010-03-23 ENCOUNTER — Encounter: Payer: Self-pay | Admitting: Obstetrics and Gynecology

## 2010-03-25 ENCOUNTER — Telehealth: Payer: Self-pay | Admitting: Family Medicine

## 2010-03-25 ENCOUNTER — Encounter: Payer: Self-pay | Admitting: Family Medicine

## 2010-03-25 ENCOUNTER — Ambulatory Visit
Admission: RE | Admit: 2010-03-25 | Discharge: 2010-03-25 | Payer: Self-pay | Source: Home / Self Care | Attending: Family Medicine | Admitting: Family Medicine

## 2010-03-25 DIAGNOSIS — Z8619 Personal history of other infectious and parasitic diseases: Secondary | ICD-10-CM | POA: Insufficient documentation

## 2010-04-04 NOTE — Progress Notes (Signed)
Summary: wi request  Phone Note Call from Patient Call back at Home Phone [phone]   Reason for Call: Talk to Nurse Summary of Call: pt requesting wi appt for possible uti, daughter has wi appt with [facility] at 1:55 Initial call taken by: Knox Royalty,  May 18, 2009 8:40 AM  Follow-up for Phone Call        c/o pressure, pain with urination, frequency. started last night. placed in 1:30 work in today. aware of wait.  encouraged her to drink plenty of fluids until seen Follow-up by: Golden Circle RN,  May 18, 2009 8:47 AM

## 2010-04-04 NOTE — Progress Notes (Signed)
Summary: triage  Phone Note Call from Patient Call back at [phone]   Caller: Patient Summary of Call: Pt thinks she has a yeast infection, also having problems with her neck.  Asking to be seen today. Initial call taken by: Clydell Hakim,  November 27, 2009 9:18 AM  Follow-up for Phone Call        states her ear hurts & when she opns her mouth or chews it hurts. has been taking ibu. advised that she continue & may try heat to area. no appts left for today. offred UC or appt with pcp tomorrow. she will come in wed at 2:30 to see pcp Follow-up by: Golden Circle RN,  November 27, 2009 9:23 AM

## 2010-04-04 NOTE — Letter (Signed)
Summary: Generic Letter  [facility]  [address]   [place], [place] [address]   Phone: [phone]  Fax: [phone]    08/30/2009  Erika [name] [address] [place], [place]  [address]  Dear [name],  Your recent pap test was negative.  Your next pap test isn't due for another year.  All of your other tests only showed that you have a yeast infection at this time.       Sincerely,   Ancil Boozer  MD  Appended Document: Generic Letter mailed

## 2010-04-04 NOTE — Assessment & Plan Note (Signed)
Summary: F/U  Erika [name]   Vital Signs:  Patient profile:   30 year old female Weight:      206 pounds Temp:     98.6 degrees F oral Pulse rate:   92 / minute Pulse rhythm:   regular BP sitting:   117 / 75  (left arm) Cuff size:   regular  Vitals Entered By: Loralee Pacas CMA (March 12, 2010 10:30 AM) CC: follow-up visit Is Patient Diabetic? No Pain Assessment Patient in pain? no      Comments pt stated that ENT told her that she has a deviated septum. and she has a spot on the bottom of her left foot x 2 months    Primary Care Provider:  Delbert Harness MD  CC:  follow-up visit.  History of Present Illness: 30 yo here for follow-up  chronic pain:  continues tramadol, gabapentin, baclofen.  Worse inhips today.  hx of trochanteric bursitis.    bacterial vaginits:  recurrence of similar vaginal discharge, has not been sewxually active since last check.  NO pelvic pain  rhinitis:  was evaluated by ENT, plans on having nasal septum fixed.  bipolar disorder:  notes worsening mani with poor judgement such as getting a tattoo and nose ring.  Denies SI, HI, need for hospitalization.  Feels she is ready to start treatment again.  Habits & Providers  Alcohol-Tobacco-Diet     Tobacco Status: quit     Tobacco Counseling: to remain off tobacco products     Cigarette Packs/Day: 1/2     Year Quit: 2009     Passive Smoke Exposure: no  Exercise-Depression-Behavior     Have you felt down or hopeless? yes     Have you felt little pleasure in things? yes     Depression Counseling: further diagnostic testing and/or other treatment is indicated     Seat Belt Use: always  Comments: hx of bipolar  Allergies: No Known Drug Allergies  Social History: Risk analyst Use:  always  Physical Exam  General:  NAD, vitals reviewed.  Lungs:  Normal respiratory effort, chest expands symmetrically. Lungs are clear to auscultation, no crackles or wheezes. Heart:  Regular rate and rhythm without  murmurs, rubs, or gallops.  Normal precordium. Msk:  TTP of paraspinous musculature of lower lumbar region. antalgic gait. negative SLR bilaterally. ? TTP over bilateral trochanteric bursae Skin:  left small (1.5 mm) plantar wart under eminence of great toe.  thick callous pared down with #15 blade.  patient tolerated procedure well.  No bleeding. Psych:  Oriented X3, memory intact for recent and remote, not agitated, not suicidal, not homicidal, depressed affect, and slightly anxious.     Impression & Recommendations:  Problem # 1:  NECK AND BACK PAIN (ICD-723.1) Today with what may be recurrence of tronachteric bursitis.  Will send to sports medicine to help with evaluation.  Continue ultram and baclofen.  Would benefit from restarting treatment for bipolar disorder.  Her updated medication list for this problem includes:    Ultram 50 Mg Tabs (Tramadol hcl) ..... One tab by mouth q6 hours as needed for pain    Baclofen 10 Mg Tabs (Baclofen) .Marland Kitchen... Take one half tab  three times a day for muscle spasm.  Problem # 2:  BIPOLAR DISORDER (ICD-296.7)  Having mania- notes recent poor judgement getting a tattoo and nose ring.  Denies SI, HI, need for hospitalization.  She is open to getting treatment but feels is should be in [place] where  she lives instead of at [facility] where she has a long history.  Will try to help her find mental health provider in that area.  Orders: [facility]- Est  Level 4 ([address]60)  Problem # 3:  UNSPECIFIED VAGINITIS AND VULVOVAGINITIS (ICD-616.10) will treat with oral metronidazole and then suppressive tx with vaginal cream 1-2x per week.  The following medications were removed from the medication list:    Metronidazole 0.75 % Gel (Metronidazole) ..... One applicator per vagina twice a day for 7 days, then one applicator before bed twice a week.    Amoxicillin 500 Mg Caps (Amoxicillin) ..... One tablet twice a day for 5 days    Metronidazole 500 Mg Tabs (Metronidazole) .Marland Kitchen...  Take one tablet twice a day for 7 days Her updated medication list for this problem includes:    Metronidazole 500 Mg Tabs (Metronidazole) ..... One tablet twice a day for 7 days    Metrogel-vaginal 0.75 % Gel (Metronidazole) ..... Use nightly 1-2 times per week to prevent bv.  dispense 3 months supply  Problem # 4:  PLANTAR WART, LEFT (ICD-078.12)  pared down today in clinic,  Asked to apply salycilic acid to it once to twice per day   Orders: [facility]- Est  Level 4 ([address]) Provider Misc Charge- [facility] (Misc)  Complete Medication List: 1)  Ultram 50 Mg Tabs (Tramadol hcl) .... One tab by mouth q6 hours as needed for pain 2)  Baclofen 10 Mg Tabs (Baclofen) .... Take one half tab  three times a day for muscle spasm. 3)  Gabapentin 300 Mg Caps (Gabapentin) .... Take two tablets three times a day 4)  Metronidazole 500 Mg Tabs (Metronidazole) .... One tablet twice a day for 7 days 5)  Metrogel-vaginal 0.75 % Gel (Metronidazole) .... Use nightly 1-2 times per week to prevent bv.  dispense 3 months supply 6)  Amitriptyline Hcl 25 Mg Tabs (Amitriptyline hcl) .... Take 1 tab po at bedtime x 1 week, then increase to 2 tabs by mouth qhs x 1 week, and continue until at 4 tabs at bedtime  Patient Instructions: 1)  Make appointmet for sports medicine at front office 2)  Use salycylic acid on area twice daily Prescriptions: AMITRIPTYLINE HCL 25 MG TABS (AMITRIPTYLINE HCL) take 1 tab po at bedtime x 1 week, then increase to 2 tabs by mouth qhs x 1 week, and continue until at 4 tabs at bedtime  #90 x 0   Entered and Authorized by:   Corbin Ade MD   Signed by:   Corbin Ade MD on 03/13/2010   Method used:   [facility] to        [facility] [phone]* (retail)       [address]       [place], [place]  [address]       Ph: [phone] or [phone]       Fax: [phone]   RxID:   [id number] METROGEL-VAGINAL 0.75 % GEL (METRONIDAZOLE) use nightly 1-2 times per week to prevent  BV.  Dispense 3 months supply  #1 x 1   Entered and Authorized by:   Delbert Harness MD   Signed by:   Delbert Harness MD on 03/12/2010   Method used:   Electronically to        [facility] [phone]* (retail)       [address]       [place]  [place], [place]  [address]       Ph: [phone] or [phone]       Fax: [phone]   RxID:   [id number] METRONIDAZOLE 500 MG TABS (METRONIDAZOLE) one tablet twice a day for 7 days  #14 x 0   Entered and Authorized by:   Delbert Harness MD   Signed by:   Delbert Harness MD on 03/12/2010   Method used:   Electronically to        [facility] [phone]* (retail)       [address]       [place], [place]  [address]       Ph: [phone] or [phone]       Fax: [phone]   RxID:   [id number]    Orders Added: 1)  [facility]- Est  Level 4 [[address]] 2)  Provider Misc Charge- [facility] [Misc]     Prevention & Chronic Care Immunizations   Influenza vaccine: Fluvax Non-MCR  (12/18/2009)   Influenza vaccine due: 12/23/2008    Tetanus booster: 12/04/2008: Tdap   Tetanus booster due: 01/16/2018    Pneumococcal vaccine: Not documented  Other Screening   Pap smear: Normal  (08/30/2009)   Pap smear due: 09/2010   Smoking status: quit  (03/12/2010)
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
## 2010-04-04 NOTE — Progress Notes (Signed)
Summary: refill  Phone Note Refill Request Call back at [phone] Message from:  Patient  Refills Requested: Medication #1:  ULTRAM 50 MG TABS one tab by mouth q6 hours as needed for pain Initial call taken by: De Nurse,  December 05, 2009 1:37 PM  Follow-up for Phone Call        I have already e-filled as as her pharmacy has just contacted [place] 15 minutes before her phone call.  Please let he know I filled it and she does not need to also call it in and to just contact her pharmacy and they will handle it. Follow-up by: Delbert Harness MD,  December 05, 2009 1:39 PM
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
## 2010-04-04 NOTE — Assessment & Plan Note (Signed)
Summary: ?yeast infection from antibiotic/[facility]   Vital Signs:  Patient profile:   30 year old female Weight:      207 pounds Temp:     98.4 degrees F oral Pulse rate:   93 / minute Pulse rhythm:   regular BP sitting:   132 / 77  (left arm) Cuff size:   regular  Vitals Entered By: Loralee Pacas CMA (February 21, 2010 3:25 PM) CC: ?yeast infection   Primary Care Lusine Corlett:  Delbert Harness MD  CC:  ?yeast infection.  History of Present Illness: on an antibiotic (avelox) for chronic sinus infxn (by ENT).  always gets yeast infxn with abx.  now feels very itchy.  no d/c, no odor.  no new sexual partners  Current Medications (verified): 1)  Ultram 50 Mg Tabs (Tramadol Hcl) .... One Tab By Mouth Q6 Hours As Needed For Pain 2)  Baclofen 10 Mg Tabs (Baclofen) .... Take One Half Tab  Three Times A Day For Muscle Spasm. 3)  Gabapentin 300 Mg Caps (Gabapentin) .... Take Two Tablets Three Times A Day 4)  Metronidazole 0.75 % Gel (Metronidazole) .... One Applicator Per Vagina Twice A Day For 7 Days, Then One Applicator Before Bed Twice A Week. 5)  Amoxicillin 500 Mg Caps (Amoxicillin) .... One Tablet Twice A Day For 5 Days 6)  Fluconazole 150 Mg Tabs (Fluconazole) .... Take One Tablet Now and One After You Finish Antibiotics 7)  Metronidazole 500 Mg Tabs (Metronidazole) .... Take One Tablet Twice A Day For 7 Days  Allergies (verified): No Known Drug Allergies  Review of Systems  The patient denies anorexia, fever, chest pain, and syncope.    Physical Exam  General:  NAD vs reviewed Genitalia:  Pelvic Exam:        External: normal female genitalia without lesions or masses        Vagina: normal without lesions or masses        Cervix: normal without lesions or masses        Adnexa: normal bimanual exam without masses or fullness        Uterus: normal by palpation        Pap smear: not performed   Impression & Recommendations:  Problem # 1:  UNSPECIFIED VAGINITIS AND VULVOVAGINITIS  (ICD-616.10) Assessment Deteriorated given hx and symptoms, will treat for yeast infxn.  RTC if not improvedin 2 weeks Her updated medication list for this problem includes:    Metronidazole 0.75 % Gel (Metronidazole) ..... One applicator per vagina twice a day for 7 days, then one applicator before bed twice a week.    Amoxicillin 500 Mg Caps (Amoxicillin) ..... One tablet twice a day for 5 days    Metronidazole 500 Mg Tabs (Metronidazole) .Marland Kitchen... Take one tablet twice a day for 7 days  Orders: GC/Chlamydia-[facility] (87591/87491) Wet Prep- [facility] (04540) [facility]- Est Level  3 (98119)  Complete Medication List: 1)  Ultram 50 Mg Tabs (Tramadol hcl) .... One tab by mouth q6 hours as needed for pain 2)  Baclofen 10 Mg Tabs (Baclofen) .... Take one half tab  three times a day for muscle spasm. 3)  Gabapentin 300 Mg Caps (Gabapentin) .... Take two tablets three times a day 4)  Metronidazole 0.75 % Gel (Metronidazole) .... One applicator per vagina twice a day for 7 days, then one applicator before bed twice a week. 5)  Amoxicillin 500 Mg Caps (Amoxicillin) .... One tablet twice a day for 5 days 6)  Fluconazole 150 Mg  Tabs (Fluconazole) .... Take one tablet now and one after you finish antibiotics 7)  Metronidazole 500 Mg Tabs (Metronidazole) .... Take one tablet twice a day for 7 days Prescriptions: FLUCONAZOLE 150 MG TABS (FLUCONAZOLE) take one tablet now and one after you finish antibiotics  #3 x 0   Entered and Authorized by:   Ellery Plunk MD   Signed by:   Ellery Plunk MD on 02/21/2010   Method used:   [facility]        [facility] [phone][facility] (retail)       [address]       [place], [place]  [address]       Ph: [phone] or [phone]       Fax: [phone]   RxID:   [id number] ULTRAM 50 MG TABS (TRAMADOL HCL) one tab by mouth q6 hours as needed for pain  #30 Tablet x 0   Entered and Authorized by:   Ellery Plunk MD   Signed by:   Ellery Plunk MD on  02/21/2010   Method used:   [facility] to        [facility] [phone]* (retail)       [address]       [place], [place]  [address]       Ph: [phone] or [phone]       Fax: [phone]   RxID:   [id number]    Orders Added: 1)  GC/Chlamydia-[facility] [87591/87491] 2)  Mellody Drown Prep- [facility] [87210] 3)  [facility]- Est Level  3 [37628]    Laboratory Results  Date/Time Received: February 21, 2010 3:28 PM  Date/Time Reported: February 21, 2010 3:58 PM   Wet [place] Source: vag WBC/hpf: 0-3 Bacteria/hpf: 2+  Rods Clue cells/hpf: none  Negative whiff Yeast/hpf: none Trichomonas/hpf: none Comments: RBC's present ...............test performed by......Marland KitchenBonnie A. [place], MLS (ASCP)cm

## 2010-04-04 NOTE — Progress Notes (Signed)
Summary: phn msg  Phone Note Call from Patient Call back at Home Phone [phone]   Caller: Patient Summary of Call: has what she thinks is a cold sore with blisters - not sure if she needs to come in or if nurse can recommend what to use. Initial call taken by: De Nurse,  March 25, 2010 8:43 AM  Follow-up for Phone Call        states she has never had one before. states it is very painful. work in at 4 with Dr. Orvan Falconer.  states she just had surgery. cannot get here before 4 pm Follow-up by: Golden Circle RN,  March 25, 2010 1:42 PM  Additional Follow-up for Phone Call Additional follow up Details #1::       Additional Follow-up by: Delbert Harness MD,  March 25, 2010 2:01 PM

## 2010-04-04 NOTE — Letter (Signed)
Summary: Results Follow-up Letter  [facility]  [address]   [place], [place] [address]   Phone: [phone]  Fax: [phone]    11/29/2009  [address] [place], [place]  [address]  Dear Ms. CLECKLEY,   The following are the results of your recent test(s):  Gonorrhea and chlamydia were negative.  Sincerely,  Delbert Harness MD [facility]           Appended Document: Results Follow-up Letter mailed.
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
## 2010-04-04 NOTE — Assessment & Plan Note (Signed)
Summary: vaginitis & ear pain//[place]   Vital Signs:  Patient profile:   30 year old female Height:      64 inches Weight:      197 pounds BMI:     33.94 Temp:     97.6 degrees F oral BP sitting:   102 / 68  (left arm) Cuff size:   regular  Vitals Entered By: Tessie Fass CMA (November 28, 2009 2:35 PM) CC: right ear pain. pain right side of neck x 3 days.   Primary Care Navin Dogan:  Delbert Harness MD  CC:  right ear pain. pain right side of neck x 3 days.Erika [name]  History of Present Illness: 30 yo year old with 3 days history of right ear pain  right ear pain: painful in ear and jaw.  no fever.  She also notes some nasal congestion, facial pressure.  fatigue for almost one week now- difficulty staying awake.  recent vacation with sexual partner.  Now has odorous vaginal discharge, no condom.  worried about STD's. no pelvic pain.  Current Medications (verified): 1)  Ultram 50 Mg Tabs (Tramadol Hcl) .... One Tab By Mouth Q6 Hours As Needed For Pain 2)  Baclofen 10 Mg Tabs (Baclofen) .... Take One Half Tab  Three Times A Day For Muscle Spasm. 3)  Gabapentin 300 Mg Caps (Gabapentin) .... Take Two Tablets Three Times A Day 4)  Metronidazole 0.75 % Gel (Metronidazole) .... One Applicator Per Vagina Twice A Day For 7 Days, Then One Applicator Before Bed Twice A Week. 5)  Amoxicillin 500 Mg Caps (Amoxicillin) .... One Tablet Twice A Day For 5 Days 6)  Fluconazole 150 Mg Tabs (Fluconazole) .... Take One Tablet Now and One After You Finish Antibiotics 7)  Metronidazole 500 Mg Tabs (Metronidazole) .... Take One Tablet Twice A Day For 7 Days  Allergies: No Known Drug Allergies PMH-FH-SH reviewed-no changes except otherwise noted  Social History: Formerly Married, now single. (husband incarcerated currently, she has previously lost visiting rights to her husband).  3 children.  Works taking care of elderly patient. Quit smoking in 2008.  Hx of drug use - noted to be clean on last visit but ER  UDS shows THC use.  Occasional alcohol - occasional binge drinking.  Review of Systems      See HPI  Physical Exam  General:  obese,in no acute distress; tired appearing Ears:  right tm red, full.  No jaw clicking/popping.  no tooth abscess Nose:  External nasal examination shows no deformity or inflammation. Nasal mucosa are pink and moist without lesions or exudates. Mouth:  cobblestoning Lungs:  Normal respiratory effort, chest expands symmetrically. Lungs are clear to auscultation, no crackles or wheezes.   Impression & Recommendations:  Problem # 1:  UNSPECIFIED VAGINITIS AND VULVOVAGINITIS (ICD-616.10) will treat for BV, patient has previously done prolonged course of metrogel for BV but recurred when discontinued.  Discussed with patient trying OTC vaginal products and probiotics if she would like.  Also checked for GC/Chlamydia.  Disucssed condom use.  Given her a prescription for diflucan  given treatment with amoxicillin.  Her updated medication list for this problem includes:    Metronidazole 0.75 % Gel (Metronidazole) ..... One applicator per vagina twice a day for 7 days, then one applicator before bed twice a week.    Amoxicillin 500 Mg Caps (Amoxicillin) ..... One tablet twice a day for 5 days    Metronidazole 500 Mg Tabs (Metronidazole) .Erika [name]... Take one tablet twice a day  for 7 days  Orders: GC/Chlamydia-[facility] (87591/87491) Wet Prep- [facility] (16109) [facility]- Est Level  3 (60454)  Problem # 2:  OTITIS MEDIA, ACUTE (ICD-382.9)  Her updated medication list for this problem includes:    Amoxicillin 500 Mg Caps (Amoxicillin) ..... One tablet twice a day for 5 days    Metronidazole 500 Mg Tabs (Metronidazole) .Erika [name]... Take one tablet twice a day for 7 days  Orders: [facility]- Est Level  3 ([address]11)  Complete Medication List: 1)  Ultram 50 Mg Tabs (Tramadol hcl) .... One tab by mouth q6 hours as needed for pain 2)  Baclofen 10 Mg Tabs (Baclofen) .... Take one half tab  three times a day  for muscle spasm. 3)  Gabapentin 300 Mg Caps (Gabapentin) .... Take two tablets three times a day 4)  Metronidazole 0.75 % Gel (Metronidazole) .... One applicator per vagina twice a day for 7 days, then one applicator before bed twice a week. 5)  Amoxicillin 500 Mg Caps (Amoxicillin) .... One tablet twice a day for 5 days 6)  Fluconazole 150 Mg Tabs (Fluconazole) .... Take one tablet now and one after you finish antibiotics 7)  Metronidazole 500 Mg Tabs (Metronidazole) .... Take one tablet twice a day for 7 days  Patient Instructions: 1)  I will call you if you tests show anything else besides yeast infection.  otherwise I will send you a letter. 2)  You can try products like Rephresh or Luvena for recurrent bacterial vaginitis or probiotics.  otherwise we can try another round of metrogel vaginal cream. Prescriptions: METRONIDAZOLE 500 MG TABS (METRONIDAZOLE) take one tablet twice a day for 7 days  #14 x 0   Entered and Authorized by:   Delbert Harness MD   Signed by:   Delbert Harness MD on 11/28/2009   Method used:   Electronically to        [facility] [phone]* (retail)       [address]       [place], [place]  [address]       Ph: [phone] or [phone]       Fax: [phone]   RxID:   [id number] FLUCONAZOLE 150 MG TABS (FLUCONAZOLE) take one tablet now and one after you finish antibiotics  #2 x 0   Entered and Authorized by:   Delbert Harness MD   Signed by:   Delbert Harness MD on 11/28/2009   Method used:   Electronically to        [facility] [phone]* (retail)       [address]       [place], [place]  [address]       Ph: [phone] or [phone]       Fax: [phone]   RxID:   [id number] AMOXICILLIN 500 MG CAPS (AMOXICILLIN) one tablet twice a day for 5 days  #10 x 0   Entered and Authorized by:   Delbert Harness MD   Signed by:   Delbert Harness MD on 11/28/2009   Method used:   Electronically to        [facility] [phone]*  (retail)       [address]       [place], [place]  [address]       Ph: [phone] or [phone]  Fax: [phone]   RxID:   [id number]   Laboratory Results  Date/Time Received: November 28, 2009 3:17 PM  Date/Time Reported: November 28, 2009 3:31 PM   [employer] Source: vag WBC/hpf: 1-5 Bacteria/hpf: 3+  Cocci Clue cells/hpf: moderate  Positive whiff Yeast/hpf: none Trichomonas/hpf: none Comments: ...............test performed by......Erika KitchenBonnie A. [place], MLS (ASCP)cm

## 2010-04-04 NOTE — Progress Notes (Signed)
Summary: Triage  Phone Note Call from Patient Call back at [phone]   Reason for Call: Talk to Nurse Summary of Call: pt vomitting from meds she was started on yesterday for UTI Initial call taken by: Knox Royalty,  August 28, 2009 10:26 AM  Follow-up for Phone Call        to Dr. Sandi Mealy who saw her yesterday Follow-up by: Golden Circle RN,  August 28, 2009 10:35 AM  Additional Follow-up for Phone Call Additional follow up Details #1::        she can use her phenergan she has at home.  if she needs a refill can call it in.  if still cannot keep meds down please let us know.  Additional Follow-up by: Ancil Boozer  MD,  August 28, 2009 10:37 AM    Additional Follow-up for Phone Call Additional follow up Details #2::    LM Follow-up by: Golden Circle RN,  August 28, 2009 11:38 AM  Additional Follow-up for Phone Call Additional follow up Details #3:: Details for Additional Follow-up Action [facility]  [facility] [address]. states she cannot stop vomiting & now she has diarrhea. unable to work . states she feels really bad. told her i will send this to md & call her with response Additional Follow-up by: Golden Circle RN,  August 28, 2009 2:05 PM   it sounds like she needs more urgent evaluation.  called to recommend she go to urgent care.  bring her medicines with her. she states she doesn't want to go out again today and she'd rather rest but if she gets worse overnight she will go to get seen and if still feeling poorly in the AM she will call for an appt tomorrow to be seen again.  i advised her that i do still want her to take her antibiotics prescribed and that i think the nausea/diarrhea is likely from the flagyl and i am hoping that as that gets our of her system she will start feeling better.  she states she already has follow up scheduled with dr Earnest Bailey this friday. Ancil Boozer  MD  August 28, 2009 5:52 PM
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
## 2010-04-04 NOTE — Assessment & Plan Note (Signed)
Summary: F/U/KH   Vital Signs:  Patient profile:   30 year old female Height:      64 inches Weight:      197 pounds BMI:     33.94 Temp:     98.6 degrees F oral Pulse rate:   96 / minute BP sitting:   126 / 80  (left arm) Cuff size:   regular  Vitals Entered By: Jimmy Footman, CMA (September 28, 2009 3:02 PM) CC: f/u back pain Pain Assessment Patient in pain? yes     Location: back Intensity: 10+ Type: sharp   Primary Care Provider:  Delbert Harness MD  CC:  f/u back pain.  History of Present Illness: 30 yo here for follow-up of back pain  Continues to have back pain no change from previous.  Has increased gabapentin herself to 2 tabs three times a day.  says flexeril "doesn't work" even when taking 2 tabs nightly.  Ultram also doesnt work.  discussed impact of bipolar disorder on her chronic pain.  Does not want treatment for bipolar do.  Spent a great deal of time talking about her pain/anxiety/depression.  She says she gets very frustated every time she sees a mental health provider because her medications cause her side effects such as sedation or they worsen her anxiety and she does not beleive bipolar tx can help anxiety and would rather have anxiety pills.  She admits to getting easily frustrated and does not have patience or the time/financial resources to address her bipolar at this time.  Habits & Providers  Alcohol-Tobacco-Diet     Tobacco Status: quit  Current Medications (verified): 1)  Ultram 50 Mg Tabs (Tramadol Hcl) .... One Tab By Mouth Q6 Hours As Needed For Pain 2)  Metaxalone 800 Mg Tabs (Metaxalone) .... Take One Tablet Before Bed 3)  Gabapentin 300 Mg Caps (Gabapentin) .... Take Two Tablets Three Times A Day  Allergies: No Known Drug Allergies PMH-FH-SH reviewed-no changes except otherwise noted  Social History: Smoking Status:  quit  Review of Systems      See HPI  Physical Exam  General:  obese,in no acute distress; alert,appropriate and  cooperative throughout examination. vitals reviewed. Frustrated when discussing hurdles to bipolar care   Impression & Recommendations:  Problem # 1:  LOW BACK PAIN, CHRONIC (ICD-724.2)  discussed with patient approach to chronicpain management.  She has previously done physical theapy for shoulder pain and declines PT for back pain due to lack of financial, time, and emotional resources at the time.  Stressed importance of tx bipolar do and mood's effect on chronic pain.  Also encouraged losing weight, increased physical activity.  Today will change flexeril to skelaxin and continue same dose of ultram and increased gabapentin.  Ibuprofen as needed.  Her updated medication list for this problem includes:    Ultram 50 Mg Tabs (Tramadol hcl) ..... One tab by mouth q6 hours as needed for pain    Metaxalone 800 Mg Tabs (Metaxalone) .Marland Kitchen... Take one tablet before bed  Orders: [facility]- Est Level  3 (13086)  Problem # 2:  BIPOLAR DISORDER (ICD-296.7)  see above.   Offered restarting meds with [facility] follow- or finding another provider that would be a better match.  Declines further tx- does not want to address bipolar do at this time.   Orders: [facility]- Est Level  3 ([address]846)  Complete Medication List: 1)  Ultram 50 Mg Tabs (Tramadol hcl) .... One tab by mouth q6 hours as needed for  pain 2)  Metaxalone 800 Mg Tabs (Metaxalone) .... Take one tablet before bed 3)  Gabapentin 300 Mg Caps (Gabapentin) .... Take two tablets three times a day Prescriptions: ULTRAM 50 MG TABS (TRAMADOL HCL) one tab by mouth q6 hours as needed for pain  #30 x 0   Entered and Authorized by:   Delbert Harness MD   Signed by:   Delbert Harness MD on 09/28/2009   Method used:   [facility] to        [facility] [phone][facility] (retail)       [address]       [place], [place]  [address]       Ph: [phone] or [phone]       Fax: [phone]   RxID:   [id number] GABAPENTIN 300 MG CAPS (GABAPENTIN) take two  tablets three times a day  #180 x 1   Entered and Authorized by:   Delbert Harness MD   Signed by:   Delbert Harness MD on 09/28/2009   Method used:   [facility]ronically to        [facility] [phone]* (retail)       [address]       [place], [place]  [address]       Ph: [phone] or [phone]       Fax: [phone]   RxID:   [id number] METAXALONE 800 MG TABS (METAXALONE) take one tablet before bed  #30 x 1   Entered and Authorized by:   Delbert Harness MD   Signed by:   Delbert Harness MD on 09/28/2009   Method used:   [facility]ronically to        [facility] [phone]* (retail)       [address]       [place], [place]  [address]       Ph: [phone] or [phone]       Fax: [phone]   RxID:   [id number]

## 2010-04-04 NOTE — Letter (Signed)
Summary: Generic Letter  [facility]  [address]   [place], [place] [address]   Phone: [phone]  Fax: [phone]    02/23/2010  Erika [name] [address] [place], [place]  [address]  Dear [name],    I wanted to let you know that the testing we did in clinic last week was negative.  Please call my office with any questions.       Sincerely,   Ellery Plunk MD  Appended Document: Generic Letter mailed
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
## 2010-04-04 NOTE — Assessment & Plan Note (Signed)
Summary: flu shot/eo  Nurse Visit   Vital Signs:  Patient profile:   30 year old female Temp:     98.3 degrees F oral  Vitals Entered By: Loralee Pacas CMA (December 18, 2009 2:49 PM)   Allergies: No Known Drug Allergies  Immunizations Administered:  Influenza Vaccine # 1:    Vaccine Type: Fluvax Non-MCR    Site: right deltoid    Mfr: GlaxoSmithKline    Dose: 0.5 ml    Route: IM    Given by: Loralee Pacas CMA    Exp. Date: 08/28/2010    Lot #: [id number]    VIS given: 09/25/2009  Flu Vaccine Consent Questions:    Do you have a history of severe allergic reactions to this vaccine? no    Any prior history of allergic reactions to egg and/or gelatin? no    Do you have a sensitivity to the preservative Thimersol? no    Do you have a past history of Guillan-Barre Syndrome? no    Do you currently have an acute febrile illness? no    Have you ever had a severe reaction to latex? no    Vaccine information given and explained to patient? yes    Are you currently pregnant? no  Orders Added: 1)  Influenza Vaccine NON MCR [00028] 2)  Admin 1st Vaccine [09811]

## 2010-04-04 NOTE — Letter (Signed)
Summary: Generic Letter  [facility]  [address]   [place], [place] [address]   Phone: [phone]  Fax: [phone]    03/14/2010  Erika [name] [address] [place], [place]  [address]  Dear Erika [name],  I have reserached mental health providers in your and I think [facility] will be your best option for finding a psychiatrist clsoer to home.  I have included their contact information below.  If you need any additional help with finding a psychiatristm please let me know.  We are always happy to see you here at the [facility].  [facility] [address] [place], [place] [address] [phone] [email or web address]   Sincerely,   Delbert Harness MD  Appended Document: Generic Letter mailed

## 2010-04-04 NOTE — Progress Notes (Signed)
Summary: meds  Phone Note Call from Patient Call back at [phone]   Caller: Patient Summary of Call: thinks she has BV and wants to know if she can have something called in. [facility] Initial call taken by: De Nurse,  October 09, 2009 11:37 AM  Follow-up for Phone Call        she just started her period. wants to know if able, plz just call rx in. if not, can she be seen for this while bleeding? Follow-up by: Golden Circle RN,  October 09, 2009 11:43 AM  Additional Follow-up for Phone Call Additional follow up Details #1::        Will refill suppositories not tablets- remind her that she needs to continue treatment for prevention- as directed on prescription. Additional Follow-up by: Delbert Harness MD,  October 09, 2009 12:38 PM    Additional Follow-up for Phone Call Additional follow up Details #2::    pt notified & dosing explained Follow-up by: Golden Circle RN,  October 09, 2009 2:10 PM  New/Updated Medications: METRONIDAZOLE 0.75 % GEL (METRONIDAZOLE) one applicator per vagina twice a day for 7 days, then one applicator before bed twice a week. Prescriptions: METRONIDAZOLE 0.75 % GEL (METRONIDAZOLE) one applicator per vagina twice a day for 7 days, then one applicator before bed twice a week.  #30 x 1   Entered and Authorized by:   Delbert Harness MD   Signed by:   Delbert Harness MD on 10/09/2009   Method used:   Electronically to        [facility] [phone]* (retail)       [address]       [place], [place]  [address]       Ph: [phone] or [phone]       Fax: [phone]   RxID:   [id number]

## 2010-04-04 NOTE — Assessment & Plan Note (Signed)
Summary: back pain, pelvic pain,df   Vital Signs:  Patient profile:   30 year old female Height:      64 inches Weight:      201.6 pounds BMI:     34.73 Pulse rate:   73 / minute BP sitting:   110 / 74  (left arm) Cuff size:   large  Vitals Entered By: Gladstone Pih (August 31, 2009 2:25 PM) CC: back pain, urinary symptoms Is Patient Diabetic? No Pain Assessment Patient in pain? yes     Location: back Intensity: 7 Type: aching Comments unable to get temp due to drinking cold liquid   Primary Care Provider:  Delbert Harness MD  CC:  back pain and urinary symptoms.  History of Present Illness: 30 yo here for followup of low back pain and urinary symptoms  1.  Low back pain:  has been seen more frequently recently for chronic low back pain.  Patient continues to have pain that was at first relieved  by  tramadol but now is not.  She takes flexeril only at night due to drowsiness.  Pain located in lower lumbar/SI area.  Also has history of bilateral trochanteric bursitis.  Pain characteristics unchanged.  No weakness, numbness.  2.  dysuria:  has been seen with complaints of dysuria, polyuria and lower pelvic pressure.  Has stable stress incontinence since childbirth.  Has had several abx courses and is currently completing  abx.  Last urine culture negative.  Habits & Providers  Alcohol-Tobacco-Diet     Passive Smoke Exposure: no  Current Medications (verified): 1)  Ultram 50 Mg Tabs (Tramadol Hcl) .... One Tab By Mouth Q6 Hours As Needed For Pain 2)  Cyclobenzaprine Hcl 10 Mg  Tabs (Cyclobenzaprine Hcl) .Marland Kitchen.. 1 By Mouth 2 Times Daily As Needed For Back Pain 3)  Promethazine Hcl 12.5 Mg Tabs (Promethazine Hcl) .... One Tab By Mouth Q6 Hours As Needed  For Nausea 4)  Keflex 500 Mg Caps (Cephalexin) .Marland Kitchen.. 1 By Mouth Two Times A Day For 10 Days For Urine Infection 5)  Fluconazole 150 Mg Tabs (Fluconazole) .Marland Kitchen.. 1 By Mouth Today For Yeast and Repeat in 1 Week. 6)  Gabapentin 300 Mg  Caps (Gabapentin) .... Take One Tablet Three Times A Day  Allergies: No Known Drug Allergies  Social History: Formerly Married, now single. (husband incarcerated currently, she haspreviously lost visiting rights to her husband).  3 children.  Works taking care of elderly patient. Quit smoking in 2008.  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  Occasional alcohol - occasional binge drinking.Passive Smoke Exposure:  no  Review of Systems      See HPI General:  Denies fever and weakness. GU:  Complains of dysuria, incontinence, and urinary frequency; denies abnormal vaginal bleeding, discharge, genital sores, and hematuria. MS:  Denies low back pain.  Physical Exam  General:  obese,in no acute distress; alert,appropriate and cooperative throughout examination. vitals reviewed.  Abdomen:  tender to palpation in lower pelvis.  no rebound, guarding.  normal BS. Msk:  TTP of paraspinous musculature of lower lumbar region. antalgic gait. negative SLR bilaterally.   Impression & Recommendations:  Problem # 1:  LOW BACK PAIN, CHRONIC (ICD-724.2) Assessment Comment Only reviewed low back and hip xrays from Jan 2011.  No new reason for worsening back pain.  Likely back strain aggravated chronic back pain.  Discussed therapy but patient does not think her insurancet  will pay for it.  Encouraged increasing physical activity, losing weight, previously given back exercises.  Reluctant to start chronic pain regmen given young age and excellent capacity for rehabilitation.  WIll try gabapentin and follow-up in 203 weeks.  Her updated medication list for this problem includes:    Ultram 50 Mg Tabs (Tramadol hcl) ..... One tab by mouth q6 hours as needed for pain    Cyclobenzaprine Hcl 10 Mg Tabs (Cyclobenzaprine hcl) .Marland Kitchen... 1 by mouth 2 times daily as needed for back pain  Problem # 2:  UTI (ICD-599.0) Currently completing course of abx for UTI- culture negative.  Still  symptomatic.  Maybe a component of pelvic pain.  Willfollow-up after she completes abx for resolution.    Her updated medication list for this problem includes:    Keflex 500 Mg Caps (Cephalexin) .Marland Kitchen... 1 by mouth two times a day for 10 days for urine infection  Orders: [facility]- Est Level  3 ([address])  Problem # 3:  BIPOLAR DISORDER (ICD-296.7)  Complete Medication List: 1)  Ultram 50 Mg Tabs (Tramadol hcl) .... One tab by mouth q6 hours as needed for pain 2)  Cyclobenzaprine Hcl 10 Mg Tabs (Cyclobenzaprine hcl) .Marland Kitchen.. 1 by mouth 2 times daily as needed for back pain 3)  Promethazine Hcl 12.5 Mg Tabs (Promethazine hcl) .... One tab by mouth q6 hours as needed  for nausea 4)  Keflex 500 Mg Caps (Cephalexin) .Marland Kitchen.. 1 by mouth two times a day for 10 days for urine infection 5)  Fluconazole 150 Mg Tabs (Fluconazole) .Marland Kitchen.. 1 by mouth today for yeast and repeat in 1 week. 6)  Gabapentin 300 Mg Caps (Gabapentin) .... Take one tablet three times a day  Patient Instructions: 1)  Physical therpy, back exercises, and losing weight will keep you functional and prevent relapses in your pain. 2)  Will start gabapentin for chronic pain.  Start with one tablet ebfore bedtime and work up to three times a day. 3)  Follow-up in 2-3 weeks. Prescriptions: GABAPENTIN 300 MG CAPS (GABAPENTIN) take one tablet three times a day  #90 x 0   Entered and Authorized by:   Delbert Harness MD   Signed by:   Delbert Harness MD on 09/01/2009   Method used:   [facility]ically to        [facility] [phone]* (retail)       [address]       [place], [place]  [address]       Ph: [phone] or [phone]       Fax: [phone]   RxID:   [id number]

## 2010-04-04 NOTE — Progress Notes (Signed)
Summary: triage  Phone Note Call from Patient Call back at Home Phone [phone]   Caller: Patient Complaint: Earache/Ear Infection Summary of Call: chills/body aches/nausea/vomiting/diarrhea - needs to talk to nurse  Initial call taken by: De Nurse,  March 23, 2009 9:07 AM  Follow-up for Phone Call        woke up in middle of night with above symptoms. says this is the worst she has ever felt. has been in hospital with dtr & thinks she "picked up" a virus there. she will be here in 45 minutes. will be seen by work in [place]. Follow-up by: Golden Circle RN,  March 23, 2009 9:14 AM

## 2010-04-04 NOTE — Progress Notes (Signed)
  Phone Note Outgoing Call   Call placed by: Jimmy Footman, CMA,  November 29, 2009 9:26 AM Call placed to: Patient Summary of Call: spoke with patient and informed her that BV was positive and that metrodizanol was called into pharmacy and is ready to be picked up Initial call taken by: Jimmy Footman, CMA,  November 29, 2009 9:27 AM
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
## 2010-04-04 NOTE — Miscellaneous (Signed)
Summary: refill  Clinical Lists Changes  Patient seen in office today, asks for refill on tramadol to be [facility]. Message to PCP...............................................Marland KitchenGaren Grams LPN March 25, 2010 4:32 PM  Medications: Rx of ULTRAM 50 MG TABS (TRAMADOL HCL) one tab by mouth q6 hours as needed for pain;  #30 Tablet x 3;  Signed;  Entered by: Delbert Harness MD;  Authorized by: Delbert Harness MD;  Method used: [id number] [address], [place], [place], [place]  [address], Ph: [phone] or [phone], Fax: [phone]    Prescriptions: ULTRAM 50 MG TABS (TRAMADOL HCL) one tab by mouth q6 hours as needed for pain  #30 Tablet x 3   Entered and Authorized by:   Delbert Harness MD   Signed by:   Delbert Harness MD on 03/25/2010   Method used:   [facility]ronically to        [facility] [phone]* (retail)       [address]       [place], [place]  [address]       Ph: [phone] or [phone]       Fax: [phone]   RxID:   [id number]

## 2010-04-04 NOTE — Assessment & Plan Note (Signed)
Summary: back pain   Vital Signs:  Patient profile:   30 year old female Height:      64 inches Weight:      202 pounds BMI:     34.80 Temp:     98.4 degrees F oral Pulse rate:   64 / minute BP sitting:   100 / 63  (left arm) Cuff size:   large  Vitals Entered By: Tessie Fass CMA (August 14, 2009 4:31 PM) CC: lower back pain Is Patient Diabetic? No Pain Assessment Patient in pain? yes     Location: lower back Intensity: 7   Primary Care Provider:  Delbert Harness MD  CC:  lower back pain.  History of Present Illness: BACK PAIN Location: lower lumbar region Onset: chronic issue, with recent exacerbation of last several days Description: throbbing/aching pain  Symptoms Worse with: walking, activity, lifting Better with: some improvement with tramadol Trauma: no  Red Flags Fecal/urinary incontinence: no no numbness, tingling, paresthesias.  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     Habits & Providers  Alcohol-Tobacco-Diet     Tobacco Status: quit     Tobacco Counseling: to remain off tobacco products  Current Medications (verified): 1)  None  Allergies (verified): No Known Drug Allergies  Physical Exam  General:  obese,in no acute distress; alert,appropriate and cooperative throughout examination. vitals reviewed.  Msk:  TTP of paraspinous musculature of lower lumbar region. antalgic gait. normal toe and heel walking. full ROM with extension and flexion of back. negative SLR bilaterally.   Impression & Recommendations:  Problem # 1:  LOW BACK PAIN, ACUTE (ICD-724.2) Assessment New likely lumbar strain 2/2 lifting. refill of ultram and rx for flexeril sent to pharmacy. patient given exercises and injection of toradol.  f/u as needed.   The following medications were removed from the medication list:    Ibuprofen 800 Mg Tabs (Ibuprofen) .Marland Kitchen... 1 tablet by mouth three times a day as needed for pain    Ultram 50 Mg Tabs (Tramadol hcl) ..... One tab by mouth q6 hours as needed for pain    Ibuprofen 600 Mg Tabs (Ibuprofen) ..... One by mouth q6 hours as needed for pain    Oxycodone-acetaminophen 5-325 Mg Tabs (Oxycodone-acetaminophen) .Marland Kitchen... 1 by mouth q 6 hours as needed severe pain Her updated medication list for this problem includes:    Ultram 50 Mg Tabs (Tramadol hcl) ..... One tab by mouth q6 hours as needed for pain    Cyclobenzaprine Hcl 10 Mg Tabs (Cyclobenzaprine hcl) .Marland Kitchen... 1 by mouth 2 times daily as needed for back pain  Orders: Ur[facility]ysis-[facility] (00000) [facility]- Est Level  3 (84696) Prescriptions: CYCLOBENZAPRINE HCL 10 MG  TABS (CYCLOBENZAPRINE HCL) 1 by mouth 2 times daily as needed for back pain  #20 x 0   Entered and Authorized by:   Lequita Asal  MD   Signed by:   Lequita Asal  MD on 08/14/2009   Method used:   Electronically to        [id number] (retail)       [address]       [place], [place]  [address]       Ph: [phone] or [phone]  Fax: [phone]   RxID:   [id number] ULTRAM 50 MG TABS (TRAMADOL HCL) one tab by mouth q6 hours as needed for pain  #30 x 0   Entered and Authorized by:   Lequita Asal  MD   Signed by:   Lequita Asal  MD on 08/14/2009   Method used:   Electronically to        [id number] (retail)       [address]       [place], [place]  [address]       Ph: [phone] or [phone]       Fax: [phone]   RxID:   [id number]   Appended Document: back pain  Laboratory Results   Urine Tests  Date/Time Received: August 14, 2009 4:38 PM  Date/Time Reported: August 14, 2009 5:06 PM   Routine Urinalysis   Color: yellow Appearance: Clear Glucose: negative   (Normal  Range: Negative) Bilirubin: negative   (Normal Range: Negative) Ketone: negative   (Normal Range: Negative) Spec. Gravity: 1.020   (Normal Range: 1.003-1.035) Blood: negative   (Normal Range: Negative) pH: 5.0   (Normal Range: 5.0-8.0) Protein: negative   (Normal Range: Negative) Urobilinogen: 0.2   (Normal Range: 0-1) Nitrite: negative   (Normal Range: Negative) Leukocyte Esterace: trace   (Normal Range: Negative)  Urine Microscopic WBC/HPF: 5-15 RBC/HPF: rare Bacteria/HPF: 1+ Epithelial/HPF: 1-5    Comments: ...........test performed by...........Marland KitchenTerese Door, CMA       Medication Administration  Injection # 1:    Medication: Ketorolac-Toradol 15mg     Diagnosis: LOW BACK PAIN, ACUTE (ICD-724.2)    Route: IM    Site: L deltoid    Exp Date: 02/01/2011    Lot #: [id number]    Mfr: hospira    Patient tolerated injection without complications    Given by: Loralee Pacas CMA (August 14, 2009 5:14 PM)

## 2010-04-04 NOTE — Assessment & Plan Note (Signed)
Summary: uti?,df   Vital Signs:  Patient profile:   30 year old female Height:      64 inches Weight:      204 pounds BMI:     35.14 BSA:     1.97 Temp:     97.6 degrees F Pulse rate:   87 / minute BP sitting:   124 / 78  Vitals Entered By: Jone Baseman CMA (August 21, 2009 2:56 PM) CC: ? UTI x 1 week Is Patient Diabetic? No   Primary Care Provider:  Delbert Harness MD  CC:  ? UTI x 1 week.  History of Present Illness: DYSURIA Onset: 1 week Modifying factors: some relief with pyridium  Symptoms Urgency: yes  Frequency: yes  Hesitancy: no  Hematuria: no  Flank Pain: no  Fever: no Nausea/Vomiting: yes, nausea without vomiting  Missed LMP: no STD exposure: no Discharge: no Irritants: no Rash: no  Red Flags   More than 3 UTI's last 12 months: no  PMH of  Diabetes or Immunosuppression: no  Renal Disease/Calculi: no Urinary Tract Abnormality: no  Instrumentation or Trauma: no    Current Medications (verified): 1)  Ultram 50 Mg Tabs (Tramadol Hcl) .... One Tab By Mouth Q6 Hours As Needed For Pain 2)  Cyclobenzaprine Hcl 10 Mg  Tabs (Cyclobenzaprine Hcl) .Marland Kitchen.. 1 By Mouth 2 Times Daily As Needed For Back Pain  Allergies (verified): No Known Drug Allergies  Physical Exam  General:  obese,in no acute distress; alert,appropriate and cooperative throughout examination. vitals reviewed.  Abdomen:  no CVA TTP bilaterally; soft, NT, ND, +BS   Impression & Recommendations:  Problem # 1:  UTI (ICD-599.0) Assessment New  U/A consistent with cystitis. rx for ciprofloxacin. patient to call if symptoms not improving.   Her updated medication list for this problem includes:    Ciprofloxacin Hcl 500 Mg Tabs (Ciprofloxacin hcl) ..... One tab by mouth two times a day x3 days.    Metronidazole 500 Mg Tabs (Metronidazole) ..... One tab by mouth two times a day x 7 days.  Orders: [facility]- Est Level  3 ([address])  Problem # 2:  LOW BACK PAIN, ACUTE (ICD-724.2) Assessment:  Unchanged improved with toradol previously. repeat injection today. f/u with PCP to discuss imaging, PT, etc.   Her updated medication list for this problem includes:    Ultram 50 Mg Tabs (Tramadol hcl) ..... One tab by mouth q6 hours as needed for pain    Cyclobenzaprine Hcl 10 Mg Tabs (Cyclobenzaprine hcl) .Marland Kitchen... 1 by mouth 2 times daily as needed for back pain  Orders: Ketorolac-Toradol 15mg  (J1885) [facility]- Est Level  3 ([address])  Problem # 3:  BACTERIAL VAGINITIS (ICD-616.10) Assessment: Comment Only  patient feels she has another episode. no wet prep performed. rx for flagyl sent.   Her updated medication list for this problem includes:    Ciprofloxacin Hcl 500 Mg Tabs (Ciprofloxacin hcl) ..... One tab by mouth two times a day x3 days.    Metronidazole 500 Mg Tabs (Metronidazole) ..... One tab by mouth two times a day x 7 days.  Orders: [facility]- Est Level  3 ([address])  Other Orders: Urinalysis-[facility] (00000)  Patient Instructions: 1)  Drink plenty of fluids up to 3-4 quarts a day. Cranberry juice is especially recommended in addition to large amounts of water. Avoid caffeine & carbonated drinks, they tend to irritate the bladder, Return in 3-5 days if you're not better: sooner if you're feeling worse.  2)  Schedule appointment with Dr.  [name] to discuss back pain Prescriptions: METRONIDAZOLE 500 MG TABS (METRONIDAZOLE) one tab by mouth two times a day x 7 days.  #14 x 0   Entered and Authorized by:   Lequita Asal  MD   Signed by:   Lequita Asal  MD on 08/21/2009   Method used:   [facility]ically to        [id number] (retail)       [address]       [place], [place]  [address]       Ph: [phone] or [phone]       Fax: [phone]   RxID:   [id number] PROMETHAZINE HCL 12.5 MG TABS (PROMETHAZINE HCL) one tab by mouth q6 hours as needed  for nausea  #20 x 0   Entered and Authorized by:   Lequita Asal  MD   Signed by:   Lequita Asal  MD on 08/21/2009   Method used:   Electronically to        [id number]* (retail)       [address]       [place], [place]  [address]       Ph: [phone] or [phone]       Fax: [phone]   RxID:   [id number] CIPROFLOXACIN HCL 500 MG TABS (CIPROFLOXACIN HCL) one tab by mouth two times a day x3 days.  #6 x 0   Entered and Authorized by:   Lequita Asal  MD   Signed by:   Lequita Asal  MD on 08/21/2009   Method used:   Electronically to        [id number]* (retail)       [address]       [place], [place]  [address]       Ph: [phone] or [phone]       Fax: [phone]   RxID:   [id number]   Laboratory Results   Urine Tests  Date/Time Received: August 21, 2009 2:58 PM  Date/Time Reported: August 21, 2009 3:21 PM   Routine Urinalysis   Color: yellow Appearance: Cloudy Glucose: negative   (Normal Range: Negative) Bilirubin: negative   (Normal Range: Negative) Ketone: negative   (Normal Range: Negative) Spec. Gravity: 1.010   (Normal Range: 1.003-1.035) Blood: moderate   (Normal Range: Negative) pH: 5.0   (Normal Range: 5.0-8.0) Protein: negative   (Normal Range: Negative) Urobilinogen: 0.2   (Normal Range: 0-1) Nitrite: negative   (Normal Range: Negative) Leukocyte Esterace: moderate   (Normal Range: Negative)  Urine Microscopic WBC/HPF: TNTC with few clumps RBC/HPF: 0-3 Bacteria/HPF: 1+ Epithelial/HPF: 1-5    Comments: ...............test performed by......Marland KitchenBonnie A. [place], MLS (ASCP)cm        Medication Administration  Injection # 1:    Medication: Ketorolac-Toradol 15mg     Diagnosis: LOW BACK PAIN, ACUTE (ICD-724.2)    Route: IM    Site: LUOQ gluteus    Exp Date: 02/01/2011    Lot #: [id number]    Mfr: hospira    Comments: 60mg     Patient tolerated injection without complications    Given by: Jone Baseman CMA (August 21, 2009 3:32 PM)  Orders  Added: 1)  Urinalysis-[facility] [00000] 2)  Ketorolac-Toradol 15mg  [J1885] 3)  [facility]- Est Level  3 [40086]

## 2010-04-04 NOTE — Assessment & Plan Note (Signed)
Summary: uti?per pt/[place]/briscoe   Vital Signs:  Patient profile:   30 year old female Weight:      198 pounds Pulse rate:   80 / minute BP sitting:   113 / 75  (right arm)  Vitals Entered By: Arlyss Repress CMA, (May 18, 2009 2:14 PM) CC: check for UTI Is Patient Diabetic? No Pain Assessment Patient in pain? no        Primary Care Provider:  Delbert Harness MD  CC:  check for UTI.  History of Present Illness: Ms. Ammons comes in today for possible UTI.  She has 1 day history of suprapubic pressure, increased urinary frequency, dysuria, and hesitancy.  No fevers.  No vaginal discharge.    Allergies: No Known Drug Allergies  Physical Exam  General:  obese, alert, NAd vitals reviewed Abdomen:  soft, nontender, nondistended no CVA tenderness   Impression & Recommendations:  Problem # 1:  URINARY TRACT INFECTION SITE NOT SPECIFIED (ICD-599.0) Assessment New UA suspicious for UTI.  Will treat presumptively with keflex and send urine for culture.  Her updated medication list for this problem includes:    Keflex 500 Mg Caps (Cephalexin) .Marland Kitchen... 1 tab by mouth two times a day for 7 days  Orders: Urinalysis-[facility] (00000) [facility]- Est  Level 4 (40981)  Complete Medication List: 1)  Lithium Carbonate 300 Mg Cr-tabs (Lithium carbonate) .... Take one for three days.  then two for three days and then three.  always with food.  per dr. Kathrynn Running [facility]. 2)  Ibuprofen 800 Mg Tabs (Ibuprofen) .Marland Kitchen.. 1 tablet by mouth three times a day as needed for pain 3)  Ferrous Fumarate 325 (106 Fe) Mg Tabs (Ferrous fumarate) .Marland Kitchen.. 1 tablet by mouth daily on an empty stomach - may cause constipation (take stool softener to prevent) 4)  Colace 100 Mg Caps (Docusate sodium) .Marland Kitchen.. 1 tablet by mouth two times a day to prevent constipation 5)  Voltaren 1 % Gel (Diclofenac sodium) .... Apply 2g up to 4 times a day to affected shoulder as needed. disp 100 g 6)  Ultram 50 Mg Tabs (Tramadol hcl) ....  One tab by mouth q6 hours as needed for pain 7)  Metrogel-vaginal 0.75 % Gel (Metronidazole) .... One applicator per vagina twice a day for 7 days, then use one applicator before bed twice a week for 6 months 8)  Saphris 5 Mg Subl (Asenapine maleate) .... Take one at bed time.  written by dr. Kathrynn Running in [facility]. 9)  Tussionex Pennkinetic Er 8-10 Mg/50ml Lqcr (Chlorpheniramine-hydrocodone) .... One teaspoon by mouth q8h as needed cough 10)  Lamictal Odt 50 Mg Tbdp (Lamotrigine) .... Dr. Kathrynn Running prescribed per [facility]. 11)  Promethazine Hcl 12.5 Mg Tabs (Promethazine hcl) .... Take 1-2 tabs by mouth every 4-6 hours as needed 12)  Florastor 250 Mg Caps (Saccharomyces boulardii) .... Take one tab two times a day for 7 days 13)  Keflex 500 Mg Caps (Cephalexin) .Marland Kitchen.. 1 tab by mouth two times a day for 7 days Prescriptions: KEFLEX 500 MG CAPS (CEPHALEXIN) 1 tab by mouth two times a day for 7 days  #14 x 0   Entered and Authorized by:   Ardeen Garland  MD   Signed by:   Ardeen Garland  MD on 05/18/2009   Method used:   Print then Give to Patient   RxID:   [id number]   Laboratory Results   Urine Tests  Date/Time Received: May 18, 2009 1:55 PM  Date/Time Reported: May 18, 2009 2:24 PM   Routine Urinalysis   Color: yellow Appearance: Cloudy Glucose: negative   (Normal Range: Negative) Bilirubin: negative   (Normal Range: Negative) Ketone: negative   (Normal Range: Negative) Spec. Gravity: 1.025   (Normal Range: 1.003-1.035) Blood: moderate   (Normal Range: Negative) pH: 5.5   (Normal Range: 5.0-8.0) Protein: 30   (Normal Range: Negative) Urobilinogen: 0.2   (Normal Range: 0-1) Nitrite: negative   (Normal Range: Negative) Leukocyte Esterace: large   (Normal Range: Negative)  Urine Microscopic WBC/HPF: loaded RBC/HPF: 10-20 Bacteria/HPF: 2+ Epithelial/HPF: 1-5    Comments: ...............test performed by......Marland KitchenBonnie A. [place], MLS (ASCP)cm
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
## 2010-04-04 NOTE — Progress Notes (Signed)
Summary: Call from pt  Phone Note Call from Patient   Caller: Patient Summary of Call: Pt called. Cannot pick up muscle relaxer med as needs prior auth filled out.  Advised to call clinic in AM and ask PCP to fill out forms.  Initial call taken by: Clementeen Graham MD,  October 01, 2009 5:34 PM     Appended Document: Call from pt Please call and let patient know i changed skelaxin to another muscle relaxant called baclofen.  Thanks! Delbert Harness MD  October 02, 2009 1:56 PM    Clinical Lists Changes  Medications: Changed medication from METAXALONE 800 MG TABS (METAXALONE) take one tablet before bed to BACLOFEN 10 MG TABS (BACLOFEN) take one half tab  three times a day for muscle spasm. - Signed Rx of BACLOFEN 10 MG TABS (BACLOFEN) take one half tab  three times a day for muscle spasm.;  #14 x 0;  Signed;  Entered by: Delbert Harness MD;  Authorized by: Delbert Harness MD;  Method used: [id number] [address] [place], [place], [place]  [address], Ph: [phone] or [phone], Fax: [phone]    Prescriptions: BACLOFEN 10 MG TABS (BACLOFEN) take one half tab  three times a day for muscle spasm.  #14 x 0   Entered and Authorized by:   Delbert Harness MD   Signed by:   Delbert Harness MD on 10/02/2009   Method used:   Electronically to        [facility] [phone]* (retail)       [address]       [place], [place]  [address]       Ph: [phone] or [phone]       Fax: [phone]   RxID:   [id number]    Appended Document: Call from pt Patient informed.
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
## 2010-04-04 NOTE — Assessment & Plan Note (Signed)
Summary: flu symptoms/[name]   Vital Signs:  Patient profile:   30 year old female Height:      64 inches Weight:      199.8 pounds BMI:     34.42 Temp:     98.3 degrees F oral Pulse rate:   91 / minute BP sitting:   120 / 79  (right arm) Cuff size:   regular  Vitals Entered By: Garen Grams LPN (March 23, 2009 10:22 AM) CC: vomiting, diarrhea, body aches, HA, since last night Is Patient Diabetic? No Pain Assessment Patient in pain? yes     Location: body aches   Primary Care Provider:  Delbert Harness MD  CC:  vomiting, diarrhea, body aches, HA, and since last night.  History of Present Illness: 1. body aches, nausea, vomting, diarrhea  States that she felt fine yesterday. Woke up about 2 am with nausea/vomiting/diarrhea and body aches - especially in back near ribs. Daughter has been at [facility] with an abscess in the throat. Took phenergan this am without much improvement in symptoms. Decreased appetite. Vomit was brown; no coffee grounds, blood or bright green.  Had routine lab work done yesterday that was normal except for mildly elevated WBC to 11.1.  ROS: + chills and sweats, no outright fever; + HA; no sore throat. No runny nose, cough or sore throat  med rec: hasn't been taking bipolar meds for 2 weeks (planning to set up a follow-up appointment with Dr. Kathrynn Running soon).  Habits & Providers  Alcohol-Tobacco-Diet     Tobacco Status: never  Current Medications (verified): 1)  Lithium Carbonate 300 Mg Cr-Tabs (Lithium Carbonate) .... Take One For Three Days.  Then Two For Three Days and Then Three.  Always With Food.  Per Dr[facility]nn [facility]. 2)  Ibuprofen 800 Mg Tabs (Ibuprofen) .Marland Kitchen.. 1 Tablet By Mouth Three Times A Day As Needed For Pain 3)  Ferrous Fumarate 325 (106 Fe) Mg Tabs (Ferrous Fumarate) .Marland Kitchen.. 1 Tablet By Mouth Daily On An Empty Stomach - May Cause Constipation (Take Stool Softener To Prevent) 4)  Colace 100 Mg Caps (Docusate Sodium) .Marland Kitchen.. 1  Tablet By Mouth Two Times A Day To Prevent Constipation 5)  Voltaren 1 %  Gel (Diclofenac Sodium) .... Apply 2g Up To 4 Times A Day To Affected Shoulder As Needed. Disp 100 G 6)  Ultram 50 Mg Tabs (Tramadol Hcl) .... One Tab By Mouth Q6 Hours As Needed For Pain 7)  Metrogel-Vaginal 0.75 % Gel (Metronidazole) .... One Applicator Per Vagina Twice A Day For 7 Days, Then Use One Applicator Before Bed Twice A Week For 6 Months 8)  Saphris 5 Mg Subl (Asenapine Maleate) .... Take One At Bed Time.  Written By Dr. Kathrynn [facility] in [facility]. 9)  Tussionex Pennkinetic Er 8-10 Mg/43ml Lqcr (Chlorpheniramine-Hydrocodone) .... One Teaspoon By Mouth Q8h As Needed Cough 10)  Lamictal Odt 50 Mg Tbdp (Lamotrigine) .... Dr. Kathrynn Running Prescribed Per [facility]. 11)  Promethazine Hcl 12.5 Mg Tabs (Promethazine Hcl) .... Take 1-2 Tabs By Mouth Every 4-6 Hours As Needed 12)  Florastor 250 Mg Caps (Saccharomyces Boulardii) .... Take One Tab Two Times A Day For 7 Days  Allergies (verified): No Known Drug Allergies  Social History: Smoking Status:  never  Physical Exam  Additional Exam:  General:  Vital signs reviewed -- obese, afebrile Alert, appropriate; well-dressed and well-nourished HEENT: OP moist and well-hydrated, tonsils absent. Lungs:  work of breathing unlabored, clear to auscultation bilaterally;  no wheezes, rales, or ronchi; good air movement throughout Heart:  regular rate and rhythm, no murmurs; normal s1/s2 Abd: +BS, soft,  non-distended, mild diffuse TTP; negative Murphy's; no rebound/guarding; no masses Pulses:  DP and radial pulses 2+ bilaterally  Extremities:  no cyanosis, clubbing, or edema Neurologic:  alert and oriented. speech normal. Skin: brisk cap refill    Impression & Recommendations:  Problem # 1:  GASTROENTERITIS, ACUTE (ICD-558.9) Assessment New  supportive care with phenergan and probiotics. Ibuprofen for HA and body aches -- advised to avoid taking this on an  empty stomach. Return parameters discussed.  Patient/family agreeable. See instructions.  Her updated medication list for this problem includes:    Florastor 250 Mg Caps (Saccharomyces boulardii) .Marland Kitchen... Take one tab two times a day for 7 days    Her updated medication list for this problem includes:    Florastor 250 Mg Caps (Saccharomyces boulardii) .Marland Kitchen... Take one tab two times a day for 7 days  Orders: [facility]- Est Level  3 ([address])  Complete Medication List: 1)  Lithium Carbonate 300 Mg Cr-tabs (Lithium carbonate) .... Take one for three days.  then two for three days and then three.  always with food.  per dr. Kathrynn Running in [facility]. 2)  Ibuprofen 800 Mg Tabs (Ibuprofen) .Marland Kitchen.. 1 tablet by mouth three times a day as needed for pain 3)  Ferrous Fumarate 325 (106 Fe) Mg Tabs (Ferrous fumarate) .Marland Kitchen.. 1 tablet by mouth daily on an empty stomach - may cause constipation (take stool softener to prevent) 4)  Colace 100 Mg Caps (Docusate sodium) .Marland Kitchen.. 1 tablet by mouth two times a day to prevent constipation 5)  Voltaren 1 % Gel (Diclofenac sodium) .... Apply 2g up to 4 times a day to affected shoulder as needed. disp 100 g 6)  Ultram 50 Mg Tabs (Tramadol hcl) .... One tab by mouth q6 hours as needed for pain 7)  Metrogel-vaginal 0.75 % Gel (Metronidazole) .... One applicator per vagina twice a day for 7 days, then use one applicator before bed twice a week for 6 months 8)  Saphris 5 Mg Subl (Asenapine maleate) .... Take one at bed time.  written by dr. Kathrynn Running in [facility]. 9)  Tussionex Pennkinetic Er 8-10 Mg/8ml Lqcr (Chlorpheniramine-hydrocodone) .... One teaspoon by mouth q8h as needed cough 10)  Lamictal Odt 50 Mg Tbdp (Lamotrigine) .... Dr. Kathrynn Running prescribed per [facility]. 11)  Promethazine Hcl 12.5 Mg Tabs (Promethazine hcl) .... Take 1-2 tabs by mouth every 4-6 hours as needed 12)  Florastor 250 Mg Caps (Saccharomyces boulardii) .... Take one tab two times a day for  7 days  Patient Instructions: 1)  Take the phenergan as needed for vomiting 2)  Take the probiotic twice a day for the next 7 days. 3)  Take ibuprofen 800 mg to help with your muscle aches and headache.  Try to take this with food. 4)  Drink lots of fluids. 5)  If you feel worse, or have other concerns, please call or go on to the urgent care or emergency department.  Prescriptions: FLORASTOR 250 MG CAPS (SACCHAROMYCES BOULARDII) take one tab two times a day for 7 days  #14 x 0   Entered and Authorized by:   Myrtie Soman  MD   Signed by:   Myrtie Soman  MD on 03/23/2009   Method used:   Electronically to        [facility] [phone]* (retail)  [address] [place], [place]  [address]       Ph: [phone] or [phone]       Fax: [phone]   RxID:   [id number] PROMETHAZINE HCL 12.5 MG TABS (PROMETHAZINE HCL) take 1-2 tabs by mouth every 4-6 hours as needed  #25 x 0   Entered and Authorized by:   Myrtie Soman  MD   Signed by:   Myrtie Soman  MD on 03/23/2009   Method used:   Electronically to        [facility] [phone]* (retail)       [address]       [place], [place]  [address]       Ph: [phone] or [phone]       Fax: [phone]   RxID:   [id number]   Appended Document: flu symptoms/[name]    Clinical Lists Changes  Orders: Added new Service order of Promethazine up to 50mg  (S2831) - Signed       Medication Administration  Injection # 1:    Medication: Promethazine up to 50mg     Diagnosis: GASTROENTERITIS, ACUTE (ICD-558.9)    Route: IM    Site: RUOQ gluteus    Exp Date: 12/02/2010    Lot #: [id number]    Mfr: baxter    Comments: 25mg  given    Patient tolerated injection without complications    Given by: Jone Baseman CMA (March 23, 2009 12:04 PM)  Orders Added: 1)  Promethazine up to 50mg  [J2550]
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
## 2010-04-15 ENCOUNTER — Ambulatory Visit (INDEPENDENT_AMBULATORY_CARE_PROVIDER_SITE_OTHER): Payer: Medicaid Other | Admitting: Sports Medicine

## 2010-04-15 ENCOUNTER — Encounter: Payer: Self-pay | Admitting: Sports Medicine

## 2010-04-15 DIAGNOSIS — M545 Low back pain: Secondary | ICD-10-CM

## 2010-04-15 DIAGNOSIS — R52 Pain, unspecified: Secondary | ICD-10-CM | POA: Insufficient documentation
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
## 2010-05-13 ENCOUNTER — Ambulatory Visit (HOSPITAL_COMMUNITY)
Admission: RE | Admit: 2010-05-13 | Discharge: 2010-05-13 | Disposition: A | Discharge status: Home / Self Care | Payer: Medicaid Other | Attending: Psychiatry | Admitting: Psychiatry

## 2010-05-13 DIAGNOSIS — F1010 Alcohol abuse, uncomplicated: Secondary | ICD-10-CM | POA: Insufficient documentation

## 2010-05-13 DIAGNOSIS — F319 Bipolar disorder, unspecified: Secondary | ICD-10-CM | POA: Insufficient documentation

## 2010-05-13 DIAGNOSIS — F1410 Cocaine abuse, uncomplicated: Secondary | ICD-10-CM | POA: Insufficient documentation

## 2010-05-13 DIAGNOSIS — F1210 Cannabis abuse, uncomplicated: Secondary | ICD-10-CM | POA: Insufficient documentation

## 2010-05-27 ENCOUNTER — Ambulatory Visit: Payer: Medicaid Other | Admitting: Sports Medicine

## 2010-06-09 LAB — DIFFERENTIAL
Lymphocytes Relative: 20 % (ref 12–46)
Lymphs Abs: 1.8 10*3/uL (ref 0.7–4.0)
Monocytes Relative: 7 % (ref 3–12)
Neutro Abs: 6.6 10*3/uL (ref 1.7–7.7)
Neutrophils Relative %: 72 % (ref 43–77)

## 2010-06-09 LAB — BASIC METABOLIC PANEL
BUN: 12 mg/dL (ref 6–23)
Creatinine, Ser: 0.82 mg/dL (ref 0.4–1.2)
GFR calc Af Amer: >60 mL/min (ref >60)
GFR calc non Af Amer: >60 mL/min (ref >60)

## 2010-06-09 LAB — RAPID URINE DRUG SCREEN, HOSP PERFORMED
Benzodiazepines: POSITIVE — AB
Cocaine: NOT DETECTED
Tetrahydrocannabinol: POSITIVE — AB

## 2010-06-09 LAB — CBC
Platelets: 171 10*3/uL (ref 150–400)
RBC: 4.39 MIL/uL (ref 3.87–5.11)
WBC: 9.2 10*3/uL (ref 4.0–10.5)

## 2010-06-09 LAB — POCT PREGNANCY, URINE: Preg Test, Ur: NEGATIVE

## 2010-06-09 LAB — ETHANOL: Alcohol, Ethyl (B): <5 mg/dL (ref 0–10)

## 2010-06-26 ENCOUNTER — Telehealth: Payer: Self-pay | Admitting: Family Medicine

## 2010-06-26 NOTE — Telephone Encounter (Signed)
Pt is requesting a referral to [facility] for [facility] pain/bmc

## 2010-06-26 NOTE — Telephone Encounter (Signed)
Please let patient know that I would recommend she follow-up with Dr. Darrick Penna as she was seen there in February and was to have a 6 week follow-up.  I think she would be well-served there.  They can discuss further need for referral at that time.  Please assist her in scheduling appointment with sports medicine if she agrees

## 2010-07-02 ENCOUNTER — Encounter: Payer: Self-pay | Admitting: Family Medicine

## 2010-07-02 ENCOUNTER — Ambulatory Visit (INDEPENDENT_AMBULATORY_CARE_PROVIDER_SITE_OTHER): Payer: Medicaid Other | Admitting: Family Medicine

## 2010-07-02 VITALS — BP 128/84 | HR 88 | Temp 97.6°F | Wt 208.0 lb

## 2010-07-02 DIAGNOSIS — R6889 Other general symptoms and signs: Secondary | ICD-10-CM

## 2010-07-02 MED ORDER — CHLORPHENIRAMINE-HYDROCODONE 8-10 MG/5ML PO LQCR: 5.0000 mL | Freq: Two times a day (BID) | ORAL | Status: DC | PRN

## 2010-07-02 MED ORDER — PROMETHAZINE HCL 25 MG PO TABS: 25.0000 mg | ORAL_TABLET | Freq: Four times a day (QID) | ORAL | Status: DC | PRN

## 2010-07-02 NOTE — Progress Notes (Signed)
Subjective:    Patient ID: Erika [name], female    DOB: [birth year], 30 y.o.   MRN: [id number]  Influenza This is a new problem. Episode onset: 3 days ago. The problem occurs constantly. The problem has been gradually worsening. Associated symptoms include anorexia, arthralgias, a change in bowel habit, chills, fatigue and myalgias. Pertinent negatives include no diaphoresis, fever, joint swelling, numbness, urinary symptoms or visual change. She has tried acetaminophen and rest for the symptoms. The treatment provided no relief.      Review of Systems  Constitutional: Positive for chills and fatigue. Negative for fever and diaphoresis.  Gastrointestinal: Positive for anorexia and change in bowel habit.  Musculoskeletal: Positive for myalgias and arthralgias. Negative for joint swelling.  Neurological: Negative for numbness.       Objective:   Physical Exam  Vitals reviewed. Constitutional: She appears well-nourished. No distress.  HENT:  Right Ear: External ear normal.  Left Ear: External ear normal.  Mouth/Throat: Oropharynx is clear and moist. No oropharyngeal exudate.  Eyes: Conjunctivae are normal. Pupils are equal, round, and reactive to light. Right eye exhibits no discharge. Left eye exhibits no discharge.  Neck: Normal range of motion. Neck supple. No JVD present.  Cardiovascular: Normal rate, regular rhythm and normal heart sounds.   No murmur heard. Pulmonary/Chest: Effort normal and breath sounds normal. No respiratory distress. She has no wheezes.  Abdominal: Soft. Bowel sounds are normal. She exhibits no distension and no mass. There is tenderness. There is no rebound and no guarding.  Musculoskeletal: Normal range of motion. She exhibits no edema.       Muscle aches   Lymphadenopathy:    She has no cervical adenopathy.  Skin: Skin is warm and dry. No rash noted. She is not diaphoretic. There is pallor.          Assessment & [name]:

## 2010-07-02 NOTE — Assessment & Plan Note (Signed)
Likely influenza vs influenza like illness.  She is well hydrated, tolerating PO's.  No signs of serious bacterial infection.  Handout given.  Will try and manage symptoms.  Tussionex for cough and Phenergan for nausea.

## 2010-07-02 NOTE — Patient Instructions (Signed)
Influenza, Adult Influenza ("the flu") is a viral infection of the respiratory tract. It causes chills, fever, cough, headache, body aches, and sore throat. Influenza in general will make you feel sicker than when you have a common cold. Symptoms of the illness typically last a few days. Cough and fatigue may continue for as long as 7 to 10 days. Influenza is highly contagious. It spreads easily to others in the droplets from coughs and sneezes. People frequently become infected by touching something that was recently contaminated with the virus and then touch their mouth, nose or eyes. This infection is caused by a virus. Symptoms will not be reduced or improved by taking an antibiotic. Antibiotics are medications that kill bacteria, not viruses. DIAGNOSIS Diagnosis of influenza is often made based on the history and physical examination as well as the presence of influenza reports occurring in your community. Testing can be done if the diagnosis is not certain. TREATMENT Since influenza is caused by a virus, antibiotics are not helpful. Your caregiver may prescribe antiviral medicines to shorten the illness and lessen the severity. Your caregiver may also recommend influenza vaccination and/or antiviral medicines for your family members in order to prevent the spread of influenza to them. HOME CARE INSTRUCTIONS  DO NOT GIVE ASPIRIN TO PERSONS WITH INFLUENZA WHO ARE UNDER 18 YEARS OF AGE. This could lead to brain and liver damage (Reye's syndrome). Read the label on over-the-counter medicines.   Stay home from work or school if at all possible until most of your symptoms are gone.   Only take over-the-counter or prescription medicines for pain, discomfort, or fever as directed by your caregiver.   Use a cool mist humidifier to increase air moisture. This will make breathing easier.   Rest until your temperature is nearly normal: 98.6 F (37.0 C). This usually takes 3 to 4 days. Be sure you get  plenty of rest.   Drink at least eight, eight-ounce glasses of fluids per day. Fluids include water, juice, broth, gelatin, or lemonade.   Cover your mouth and nose when coughing or sneezing and wash your hands often to prevent the spread of this virus to other persons.  PREVENTION Annual influenza vaccination (flu shots) is the best way to avoid getting influenza. An annual flu shot is now routinely recommended for all adults in the [place] SEEK MEDICAL CARE IF:  You develop an oral temperature above 100.4 or as your caregiver suggests.   The fever lasts for more than 3 days.   You develop shortness of breath while resting.   You have a deep cough with production of mucous or chest pain.   You develop nausea (feeling sick to your stomach), vomiting, or diarrhea.  SEEK IMMEDIATE MEDICAL CARE IF:  You have difficulty breathing, become short of breath, or your skin or nails turn bluish.   You develop severe neck pain or stiffness.   You develop a severe headache, facial pain or earache.   You develop a high fever that is not controlled with medication or that lasts more than 3 days.   You develop nausea or vomiting that cannot be controlled.  Document Released: 02/15/2000 Document Re-Released: 05/14/2009 Exit[employer] Patient Information 2011 [employer]. 

## 2010-07-19 NOTE — Group Therapy Note (Signed)
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  On October 28, 2005, Erika [name] had a colposcopy, which showed CIN 2  and CIN 3.  Erika [name] was seen in our clinic on December 21 for a LEEP,  which showed high grade squamous intraepithelial lesion.  Results of  pathology were discussed with patient and she was given a choice of  whether or not to follow-up here or at the family practice clinic with  Dr. Dillard Essex.   The patient also complained of a yellowish discharge and a smell like  something died in her vagina.  This has been going on for 1-1/2 to 2  weeks.  The patient has had history of bacterial vaginosis in the past.  She is not allergic to any antibiotics including Flagyl.   EXAMINATION:  An obese appearing female in no apparent distress.  Normal  mons and labia.  Vaginal canal had a yellowish-greenish discharge in it.  Cervix there was no bright red blood or dark blood in the vaginal vault.  Cervix was to be healing well.   LABORATORY DATA:  A wet prep was done which showed clue cells, no  yeast, no trichomonas.  Numerous white blood cells.   ASSESSMENT AND PLAN:  Problem 1.  A follow-up LEEP.  This patient will  follow up with Dr. Dillard Essex in 4 months for a repeat pap smear.  Problem 2.  Bacterial vaginitis.  The patient was given a prescription  for Flagyl 500 mg one p.o. b.i.d. for 7 days and was warned of side  effects including it was suggested that she does not drink while taking  this medication.     ______________________________  Corky Sox, MD    ______________________________  Tinnie Gens, MD    AG/MEDQ  D:  03/06/2006  T:  03/06/2006  Job:  [id number]

## 2010-07-19 NOTE — Discharge Summary (Signed)
NAMEMarland Kitchen  LANAYAH, GARTLEY NO.:  [id number]   MEDICAL RECORD NO.:  [id number]          PATIENT TYPE:  INP   LOCATION:  9137                          FACILITY:  [facility]   PHYSICIAN:  Towana Badger, M.D.       DATE OF BIRTH:  1980-06-03   DATE OF ADMISSION:  08/20/2005  DATE OF DISCHARGE:  08/22/2005                                 DISCHARGE SUMMARY   ATTENDING PHYSICIAN:  Ginger Carne, MD   REASON FOR ADMISSION:  Spontaneous onset of labor.   PROCEDURES:  Prenatal:  None.  Intrapartum:  Cesarean section, low cervical  transverse.  Postpartum:  Postpartum bilateral tubal ligation.   COMPLICATIONS:  Operative and postpartum: None.   DISCHARGE DIAGNOSES:  Term pregnancy, delivered.   HOSPITAL COURSE:  Briefly, the patient is a 30 year old G4, P1-1-1-2 who  presented at 39-3/7 weeks with spontaneous rupture of membranes.  The  patient was GBS positive, admitted and placed on Penicillin.  The patient  was given Pitocin for induction with routine hospital course until  electronic fetal monitoring showed decreased variability with increase in  baseline heart rate.  At that time infant was noted to be without response  to fetal scalp stimulation and fetal monitoring did not respond to oxygen  and maternal repositioning.  Emergent Cesarean section was called secondary  to nonreassuring fetal heart tones.  The patient had low transverse Cesarean  section with bilateral tubal ligation without complications.  The patient  had routine postpartum course and was discharged to home, stable and  afebrile.  The patient's blood type is A positive, antibody negative,  Rubella immune, GBS positive, and breastfeeding.   DISCHARGE INSTRUCTIONS:  Routine, discharged to home.   ACTIVITY:  Unrestricted.   DIET:  Routine.   DISCHARGE MEDICATIONS:  1.  Ibuprofen 600 mg q.6h. p.r.n. for pain.  2.  Percocet 5/325 one to two tablets every 4 to 6 hours p.r.n. pain.   FOLLOWUP:  Followup  with [facility] with Dr. Dillard Essex in six  weeks.   LABORATORY DATA:  Discharge hemoglobin 9.3, hematocrit 27.7.      Towana Badger, M.D.     JP/MEDQ  D:  08/22/2005  T:  08/22/2005  Job:  [id number]

## 2010-07-19 NOTE — Discharge Summary (Signed)
NAME:  Erika [name], Erika [name]               ACCOUNT NO.:  [id number]   MEDICAL RECORD NO.:  [id number]          PATIENT TYPE:  INP   LOCATION:  9155                          FACILITY:  [facility]   PHYSICIAN:  Tanya S. Shawnie Pons, M.D.   DATE OF BIRTH:  [birth year]   DATE OF ADMISSION:  06/19/2005  DATE OF DISCHARGE:  06/22/2005                                 DISCHARGE SUMMARY   ADDENDUM TO DISCHARGE SUMMARY #[id number]   LABORATORY:  The patient had a GC and chlamydia that were negative.  She had  an INR of 0.9, fibrinogen 465, D-dimer of 1.65, platelets 227.  Kleihauer-  Betke negative.  Urine drug screen negative.  Ultrasound on June 19, 2005,  showed an AFI of 7.4, slightly decreased; anterior placenta with no evidence  of abruption.  Growth was appropriate with an estimated fetal weight of 50-  75th percentile.  Normal cervical length, normal Doppler.  Ultrasound on  April 21 showed an AFI of 15.2 with no evidence of abruption or previa.   DISCHARGE INSTRUCTIONS:  The patient was discharged to home.  She was to  remain on bedrest with no sexual intercourse, up to shower and bathroom  only.  She was to return for any signs of bleeding or preterm labor.  She  was to return to the high risk clinic on Thursday.  She will be called with  an appointment.     ______________________________  August Saucer. Merlene Morse, MD    ______________________________  Shelbie Proctor. Shawnie Pons, M.D.    ABC/MEDQ  D:  06/22/2005  T:  06/24/2005  Job:  [id number]

## 2010-07-19 NOTE — Op Note (Signed)
NAMEMarland [name]  TERSA, FOTOPOULOS NO.:  [id number]   MEDICAL RECORD NO.:  [id number]          PATIENT TYPE:  INP   LOCATION:  9137                          FACILITY:  [facility]   PHYSICIAN:  Ginger Carne, MD  DATE OF BIRTH:  Aug 02, 1980   DATE OF PROCEDURE:  08/20/2005  DATE OF DISCHARGE:                                 OPERATIVE REPORT   PREOPERATIVE DIAGNOSIS:  Non-reassuring fetal heart rate and request for  sterilization.   POSTOPERATIVE DIAGNOSIS:  Non-reassuring fetal heart rate and request for  sterilization.   PROCEDURE:  1.  Primary low transverse cesarean section.  2.  Pomeroy bilateral tubal ligation.   SURGEON:  Ginger Carne, MD   ASSISTANT:  Barth Kirks, MD   COMPLICATIONS:  None immediate.   ESTIMATED BLOOD LOSS:  600 mL.   ANESTHESIA:  Spinal.   SPECIMEN:  Cord bloods.   OPERATIVE FINDINGS:  Term infant female delivered in the vertex  presentation, Apgar scores and weight per delivery room record, no gross  abnormalities.  Baby cried spontaneously at delivery.  Cord blood was  obtained; however, cord pH could not be obtained due to an inadequate  specimen volume from the umbilical cord artery.  Three-vessel cord, central  insertion.  Placenta complete.   OPERATIVE PROCEDURE:  The patient was prepped and draped in the usual  fashion and placed in left lateral supine position.  Betadine solution was  used for antiseptic and the patient was catheterized prior to the procedure.  After adequate spinal analgesia, a Pfannenstiel incision was made and the  abdomen opened, lower uterine segment incised transversely after developing  the bladder flap, baby delivered, cord clamped and cut and infant given to  the pediatric staff after bulb-suctioning.  Placenta was removed manually.  Uterus was inspected.  I closed the uterine musculature in 1 layer of 0  Vicryl running interlocking suture.  Bleeding points were hemostatically  checked, blood  clots removed, closure of parietal peritoneum with 2-0 Vicryl running  suture, 0 Vicryl running for the fascia, 3-0 Vicryl for subcutaneous layer  and skin staples for the skin.  Instrument and sponge count were correct.  The patient tolerated the procedure well and returned to the postanesthesia  recovery room in excellent condition.      Ginger Carne, MD  Electronically Signed     SHB/MEDQ  D:  08/20/2005  T:  08/21/2005  Job:  [id number]
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
## 2010-07-23 ENCOUNTER — Ambulatory Visit (INDEPENDENT_AMBULATORY_CARE_PROVIDER_SITE_OTHER): Payer: Medicaid Other | Admitting: Family Medicine

## 2010-07-23 ENCOUNTER — Encounter: Payer: Self-pay | Admitting: Family Medicine

## 2010-07-23 VITALS — BP 128/79 | HR 80 | Wt 206.0 lb

## 2010-07-23 DIAGNOSIS — L237 Allergic contact dermatitis due to plants, except food: Secondary | ICD-10-CM

## 2010-07-23 DIAGNOSIS — L255 Unspecified contact dermatitis due to plants, except food: Secondary | ICD-10-CM

## 2010-07-23 MED ORDER — HYDROCORTISONE 2.5 % EX CREA: TOPICAL_CREAM | Freq: Two times a day (BID) | CUTANEOUS | Status: DC

## 2010-07-23 NOTE — Progress Notes (Signed)
Subjective:    Patient ID: Erika [name], female    DOB: 04-22-1980, 29 y.o.   MRN: [id number]  HPI 1. Rash Patient came in contact with a lot of plant material mowing 6 lawns on Friday. She has a classic atopic dermatitis that looks like [employer]. No fever, no joint swelling, no tick bites, no vision changes.   Review of Systems See hpi    Objective:   Physical Exam Skin: atopic appearing rash, diffuse pustular.     Assessment & [name]:  1. Poison Ivy Dermatitis - 2.5 % hydrocortisone cream. - advised to avoid unknown plant-life.

## 2010-08-29 ENCOUNTER — Ambulatory Visit (INDEPENDENT_AMBULATORY_CARE_PROVIDER_SITE_OTHER): Payer: Medicaid Other | Admitting: Family Medicine

## 2010-08-29 ENCOUNTER — Other Ambulatory Visit (HOSPITAL_COMMUNITY)
Admission: RE | Admit: 2010-08-29 | Discharge: 2010-08-29 | Disposition: A | Discharge status: Home / Self Care | Payer: Medicaid Other | Source: Ambulatory Visit | Attending: Family Medicine | Admitting: Family Medicine

## 2010-08-29 ENCOUNTER — Encounter: Payer: Self-pay | Admitting: Family Medicine

## 2010-08-29 DIAGNOSIS — B070 Plantar wart: Secondary | ICD-10-CM

## 2010-08-29 DIAGNOSIS — Z01419 Encounter for gynecological examination (general) (routine) without abnormal findings: Secondary | ICD-10-CM | POA: Insufficient documentation

## 2010-08-29 DIAGNOSIS — E669 Obesity, unspecified: Secondary | ICD-10-CM

## 2010-08-29 DIAGNOSIS — N871 Moderate cervical dysplasia: Secondary | ICD-10-CM

## 2010-08-29 DIAGNOSIS — D508 Other iron deficiency anemias: Secondary | ICD-10-CM

## 2010-08-29 DIAGNOSIS — S83509A Sprain of unspecified cruciate ligament of unspecified knee, initial encounter: Secondary | ICD-10-CM

## 2010-08-29 DIAGNOSIS — D649 Anemia, unspecified: Secondary | ICD-10-CM

## 2010-08-29 DIAGNOSIS — F319 Bipolar disorder, unspecified: Secondary | ICD-10-CM

## 2010-08-29 DIAGNOSIS — N760 Acute vaginitis: Secondary | ICD-10-CM

## 2010-08-29 DIAGNOSIS — Z124 Encounter for screening for malignant neoplasm of cervix: Secondary | ICD-10-CM

## 2010-08-29 DIAGNOSIS — E049 Nontoxic goiter, unspecified: Secondary | ICD-10-CM

## 2010-08-29 DIAGNOSIS — Z20828 Contact with and (suspected) exposure to other viral communicable diseases: Secondary | ICD-10-CM

## 2010-08-29 DIAGNOSIS — Z202 Contact with and (suspected) exposure to infections with a predominantly sexual mode of transmission: Secondary | ICD-10-CM

## 2010-08-29 DIAGNOSIS — R52 Pain, unspecified: Secondary | ICD-10-CM

## 2010-08-29 LAB — POCT WET PREP (WET MOUNT)
Trichomonas Wet Prep HPF POC: NEGATIVE
Yeast Wet Prep HPF POC: NEGATIVE

## 2010-08-29 NOTE — Progress Notes (Signed)
Addended by: Macy Mis on: 08/29/2010 10:29 PM   Modules accepted: Orders

## 2010-08-29 NOTE — Progress Notes (Signed)
Subjective:    Patient ID: Erika [name], female    DOB: [birth year], 30 y.o.   MRN: [id number]  HPI  Annual Gynecological Exam  Wt Readings from Last 3 Encounters:  08/29/10 200 lb 12.8 oz (91.082 kg)  07/23/10 206 lb (93.441 kg)  07/02/10 208 lb (94.348 kg)   Last period: 08/28/2010 Regular periods: no 2 per month for the past 2 months Heavy bleeding: no  Sexually active: yes Birth control or hormonal therapy:BTL Hx of STD: Patient desires STD screening Dyspareunia: No Hot flashes: No Vaginal discharge:No Dysuria:No   Last mammogram: no Breast mass or concerns: No Last Pap: 2011  History of abnormal pap: yes, cryotherpay 2007  FH of breast, uterine, ovarian, colon cancer: No  Right plantar wart:  Continues pain at the same site..  Was treated with paring and she has been diligently using OTC salycylic acid for several weeks without improvement.   Review of Systems see HPI Gen:  No fever, chills, unexplained weight loss Ears:  No hearing loss, ringing Eyes: No vision changes, double vision, eye drainage Nose:  No rhinorrhea, congestion Throat:  No sore throat or dysphagia CV:  No chest pain, palpitations, PND, dyspnea on exertion, or edema Resp: No cough, dyspnea, wheezing Abd: No nausea, vomting, diarrhea, constipation, or change in bowel color, size, or caliber. MSK: no joint pain, myalgias SKIN: no rash, changing moles GU: No dysuria, hematuria, vaginal discharge       Objective:   Physical Exam BP 112/64  Pulse 79  Temp(Src) 98.4 F (36.9 C) (Oral)  Ht 5\' 4"  (1.626 m)  Wt 200 lb 12.8 oz (91.082 kg)  BMI 34.47 kg/m2  LMP 08/28/2010  General Appearance:    Alert, cooperative, no distress, appears stated age  Head:    Normocephalic, without obvious abnormality, atraumatic  Eyes:    PERRL, conjunctiva/corneas clear, EOM's intact     Nose:   Nares normal, septum midline, mucosa normal, no drainage    or sinus tenderness  Throat:   Lips, mucosa, and  tongue normal; teeth and gums normal  Neck:   Supple, symmetrical, trachea midline, no adenopathy;    thyroid:  no enlargement/tenderness/nodules; no carotid   bruit or JVD     Lungs:     Clear to auscultation bilaterally, respirations unlabored  Chest Wall:    No tenderness or deformity   Heart:    Regular rate and rhythm, S1 and S2 normal, no murmur, rub   or gallop  Breast Exam:    No tenderness, masses, or nipple abnormality  Abdomen:     Soft, non-tender, bowel sounds active all four quadrants,    no masses, no organomegaly  Genitalia:    Normal female without lesion, discharge or tenderness  Rectal:    Normal tone, normal prostate, no masses or tenderness;   guaiac negative stool  Extremities:   Extremities normal, atraumatic, no cyanosis or edema  Pulses:   2+ and symmetric all extremities  Skin:   Skin color, texture, turgor normal, no rashes or lesions  Lymph nodes:   Cervical, supraclavicular, and axillary nodes normal            Assessment & [name]:

## 2010-08-29 NOTE — Assessment & Plan Note (Addendum)
Pap performed today. screening up to date.  Will order fasting blood work.  Patient prefers to go ahead and draw HIV and RPR today

## 2010-08-29 NOTE — Assessment & Plan Note (Signed)
Failed paring and salicylic acid. Asked her to return and will do cryotherapy.

## 2010-08-29 NOTE — Patient Instructions (Signed)
Make appt for fasting bloodwork Work on adding daily exercise to your schedule to stay healthy! Let me know if we can help with your smoking

## 2010-08-29 NOTE — Assessment & Plan Note (Signed)
Wet prep normal today

## 2010-08-30 ENCOUNTER — Encounter: Payer: Self-pay | Admitting: Family Medicine

## 2010-08-30 LAB — RPR

## 2010-08-30 LAB — GC/CHLAMYDIA PROBE AMP, GENITAL: Chlamydia, DNA Probe: NEGATIVE

## 2010-08-30 NOTE — Telephone Encounter (Signed)
error 

## 2010-09-03 ENCOUNTER — Encounter: Payer: Self-pay | Admitting: Family Medicine

## 2010-09-11 ENCOUNTER — Emergency Department (INDEPENDENT_AMBULATORY_CARE_PROVIDER_SITE_OTHER): Payer: Medicaid Other

## 2010-09-11 ENCOUNTER — Encounter (HOSPITAL_BASED_OUTPATIENT_CLINIC_OR_DEPARTMENT_OTHER): Payer: Self-pay | Admitting: *Deleted

## 2010-09-11 ENCOUNTER — Emergency Department (HOSPITAL_BASED_OUTPATIENT_CLINIC_OR_DEPARTMENT_OTHER)
Admission: EM | Admit: 2010-09-11 | Discharge: 2010-09-11 | Disposition: A | Discharge status: Home / Self Care | Payer: Medicaid Other | Attending: Emergency Medicine | Admitting: Emergency Medicine

## 2010-09-11 DIAGNOSIS — R51 Headache: Secondary | ICD-10-CM

## 2010-09-11 DIAGNOSIS — G43909 Migraine, unspecified, not intractable, without status migrainosus: Secondary | ICD-10-CM

## 2010-09-11 MED ORDER — ONDANSETRON HCL 4 MG PO TABS: 4.0000 mg | ORAL_TABLET | Freq: Four times a day (QID) | ORAL | Status: DC

## 2010-09-11 MED ORDER — ONDANSETRON HCL 4 MG PO TABS: 4.0000 mg | ORAL_TABLET | Freq: Four times a day (QID) | ORAL | Status: AC

## 2010-09-11 MED ORDER — OXYCODONE-ACETAMINOPHEN 5-325 MG PO TABS: 2.0000 | ORAL_TABLET | ORAL | Status: AC | PRN

## 2010-09-11 MED ORDER — KETOROLAC TROMETHAMINE 60 MG/2ML IM SOLN
60.0000 mg | Freq: Once | INTRAMUSCULAR | Status: AC
  Administered 2010-09-11: 60 mg via INTRAMUSCULAR
  Filled 2010-09-11: qty 2

## 2010-09-11 NOTE — ED Provider Notes (Signed)
History     Chief Complaint  Patient presents with  . Migraine   Patient is a 30 y.o. female presenting with migraine. The history is provided by the patient.  Migraine The current episode started more than 1 week ago. The problem occurs daily. The problem has not changed since onset.Associated symptoms include headaches. The symptoms are aggravated by nothing. The symptoms are relieved by nothing. She has tried nothing for the symptoms.  Haeadache worse in the evening, better in the am--located at left side of head and radiates to neck, no fever or visual changes, no emesis  Past Medical History  Diagnosis Date  . Migraine     Past Surgical History  Procedure Date  . Cesarean section   . Tubal ligation   . 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 Tonsillectomy   . Cesarean section     Family History  Problem Relation Age of Onset  . Cancer Maternal Grandmother     breast ca, cervical  . Cancer Paternal Grandmother     breast ca  . Alcohol abuse Father     History  Substance Use Topics  . Smoking status: Current Some Day Smoker -- 0.5 packs/day  . Smokeless tobacco: Not on file  . Alcohol Use: No    OB History    Grav Para Term Preterm Abortions TAB SAB Ect Mult Living                  Review of Systems  Neurological: Positive for headaches.  All other systems reviewed and are negative.    Physical Exam  BP 110/66  Pulse 73  Temp(Src) 98.3 F (36.8 C) (Oral)  Resp 16  Wt 200 lb (90.719 kg)  LMP 08/28/2010  Physical Exam  Nursing note and vitals reviewed. Constitutional: She is oriented to person, place, and time. Vital signs are normal. She appears well-developed and well-nourished.  Non-toxic appearance. She appears distressed.  HENT:  Head: Normocephalic and atraumatic.  Eyes: Conjunctivae and EOM are normal. Pupils are equal, round, and reactive to light.  Neck: Normal range of motion. Neck supple. No tracheal deviation  present.  Cardiovascular: Normal rate, regular rhythm and normal heart sounds.  Exam reveals no gallop.   No murmur heard. Pulmonary/Chest: Effort normal and breath sounds normal. No stridor. No respiratory distress. She has no wheezes.  Abdominal: Soft. Normal appearance and bowel sounds are normal. She exhibits no distension. There is no tenderness. There is no rebound.  Musculoskeletal: Normal range of motion. She exhibits no edema and no tenderness.  Neurological: She is alert and oriented to person, place, and time. She has normal strength. No cranial nerve deficit or sensory deficit. GCS eye subscore is 4. GCS verbal subscore is 5. GCS motor subscore is 6.  Skin: Skin is warm and dry.  Psychiatric: She has a normal mood and affect. Her speech is normal and behavior is normal.    ED Course  Procedures  MDM  Pt given toradol and sx slightly better, head ct neg, will f/u her pcp      Toy Baker, MD 09/11/10 2103

## 2010-09-11 NOTE — ED Notes (Signed)
Pt c/o " migraines " x 2 weeks

## 2010-09-11 NOTE — ED Notes (Signed)
Reports migraine x 2 weeks  alwert and oriented   Color good  Skin warm and dry  Moves all extremeties

## 2010-09-13 ENCOUNTER — Other Ambulatory Visit (HOSPITAL_BASED_OUTPATIENT_CLINIC_OR_DEPARTMENT_OTHER): Payer: Self-pay | Admitting: Emergency Medicine

## 2010-09-13 DIAGNOSIS — R51 Headache: Secondary | ICD-10-CM

## 2010-10-25 ENCOUNTER — Other Ambulatory Visit: Payer: Self-pay | Admitting: Family Medicine

## 2010-10-25 NOTE — Telephone Encounter (Signed)
Open by mistake

## 2010-10-26 ENCOUNTER — Other Ambulatory Visit: Payer: Self-pay | Admitting: Family Medicine

## 2010-10-26 DIAGNOSIS — R52 Pain, unspecified: Secondary | ICD-10-CM

## 2010-11-05 ENCOUNTER — Ambulatory Visit (INDEPENDENT_AMBULATORY_CARE_PROVIDER_SITE_OTHER): Payer: Medicaid Other | Admitting: Family Medicine

## 2010-11-05 DIAGNOSIS — M25539 Pain in unspecified wrist: Secondary | ICD-10-CM

## 2010-11-05 DIAGNOSIS — M79609 Pain in unspecified limb: Secondary | ICD-10-CM

## 2010-11-05 DIAGNOSIS — M79673 Pain in unspecified foot: Secondary | ICD-10-CM

## 2010-11-07 ENCOUNTER — Encounter: Payer: Self-pay | Admitting: Family Medicine

## 2010-11-07 DIAGNOSIS — M25539 Pain in unspecified wrist: Secondary | ICD-10-CM | POA: Insufficient documentation

## 2010-11-07 DIAGNOSIS — M79673 Pain in unspecified foot: Secondary | ICD-10-CM | POA: Insufficient documentation

## 2010-11-07 NOTE — Assessment & Plan Note (Signed)
symptoms only ongoing for 1 month but describes carpal tunnel syndrome.  Start with cockup wrist splints for 6 weeks at least at bedtime though encouraged to wear during work if she can.  NSAIDs, relative rest.  F/u in 6 weeks for reevaluation.  Consider nerve conduction studies if not improving.

## 2010-11-07 NOTE — Assessment & Plan Note (Signed)
Bilateral foot pain - history and exam reassuring - doubt she has a stress fracture as she is not physically active.  Likely due to prolonged standing.  Start with sports insoles for additional cushion, arch support - added scaphoid pads as well but if feels this puts her too far lateral, can remove these.
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
## 2011-01-06 ENCOUNTER — Ambulatory Visit (INDEPENDENT_AMBULATORY_CARE_PROVIDER_SITE_OTHER): Payer: Medicaid Other | Admitting: Family Medicine

## 2011-01-06 VITALS — BP 118/78 | HR 99 | Temp 99.5°F | Ht 64.0 in | Wt 195.2 lb

## 2011-01-06 DIAGNOSIS — J069 Acute upper respiratory infection, unspecified: Secondary | ICD-10-CM

## 2011-01-06 NOTE — Patient Instructions (Signed)

## 2011-01-07 DIAGNOSIS — J069 Acute upper respiratory infection, unspecified: Secondary | ICD-10-CM | POA: Insufficient documentation

## 2011-01-07 NOTE — Progress Notes (Signed)
Subjective:    Patient ID: Erika [name], female    DOB: May 30, 1980, 30 y.o.   MRN: [id number]  HPIPatient presents for a same-day appointment for 2 days of sore throat and cough  She reports her main symptom is frequent chills. She has not had an elevated temperature. She noticed a sore throat and coughing with some possible sputum. She continues to smoke. She notes no dyspnea, abdominal pain, emesis, diarrhea. She has malaise and myalgias.  Review of SystemsGeneral:  Negative for fever HEENT: Negative for conjunctivitis, ear pain or drainage, rhinorrhea, nasal congestion Respiratory:  Negative for  dyspnea Abdomen: Negative for abdominal pain, emesis, diarrhea Skin:  Negative for rash         Objective:   Physical Exam GEN: Alert & Oriented, No acute distress HEENT: /AT. EOMI, PERRLA, no conjunctival injection or scleral icterus.  Bilateral tympanic membranes intact without erythema or effusion.  .  Nares without edema or rhinorrhea.  Oropharynx is without edema or exudates.  Some mild erythema.  No anterior or posterior cervical lymphadenopathy. CV:  Regular Rate & Rhythm, no murmur Respiratory:  Normal work of breathing, CTAB Abd:  + BS, soft, no tenderness to palpation Ext: no pre-tibial edema        Assessment & [name]:

## 2011-01-07 NOTE — Assessment & Plan Note (Signed)
Patient has a viral URI. We discussed supportive care. She is off work for the next 2 days but I wrote her a note to that she will not be called in early. We discussed the expected her to be able to return to work on Thursday, if she begins to feel worse before then please return for reevaluation

## 2011-01-14 ENCOUNTER — Emergency Department (HOSPITAL_BASED_OUTPATIENT_CLINIC_OR_DEPARTMENT_OTHER)
Admission: EM | Admit: 2011-01-14 | Discharge: 2011-01-14 | Discharge status: Against Medical Advice | Payer: Medicaid Other | Attending: Emergency Medicine | Admitting: Emergency Medicine

## 2011-01-14 ENCOUNTER — Encounter (HOSPITAL_BASED_OUTPATIENT_CLINIC_OR_DEPARTMENT_OTHER): Payer: Self-pay

## 2011-01-14 DIAGNOSIS — M549 Dorsalgia, unspecified: Secondary | ICD-10-CM | POA: Insufficient documentation

## 2011-01-14 HISTORY — DX: Dorsalgia, unspecified: M54.9

## 2011-01-14 HISTORY — DX: Unspecified osteoarthritis, unspecified site: M19.90

## 2011-01-14 NOTE — ED Provider Notes (Signed)
Patient Erika [name]  Hilario Quarry, MD 01/14/11 [phone]

## 2011-01-14 NOTE — ED Notes (Signed)
Pt reports sudden onset of back pain after moving boxes.

## 2011-02-11 ENCOUNTER — Encounter: Payer: Self-pay | Admitting: Family Medicine

## 2011-02-11 ENCOUNTER — Ambulatory Visit (INDEPENDENT_AMBULATORY_CARE_PROVIDER_SITE_OTHER): Payer: Medicaid Other | Admitting: Family Medicine

## 2011-02-11 DIAGNOSIS — G43909 Migraine, unspecified, not intractable, without status migrainosus: Secondary | ICD-10-CM | POA: Insufficient documentation

## 2011-02-11 DIAGNOSIS — M542 Cervicalgia: Secondary | ICD-10-CM

## 2011-02-11 MED ORDER — DEXAMETHASONE SODIUM PHOSPHATE 4 MG/ML IJ SOLN
10.0000 mg | Freq: Once | INTRAMUSCULAR | Status: AC
  Administered 2011-02-11: 10 mg via INTRAMUSCULAR

## 2011-02-11 MED ORDER — VERAPAMIL HCL ER 120 MG PO TBCR: 120.0000 mg | EXTENDED_RELEASE_TABLET | Freq: Every day | ORAL | Status: DC

## 2011-02-11 MED ORDER — KETOROLAC TROMETHAMINE 60 MG/2ML IM SOLN
60.0000 mg | Freq: Once | INTRAMUSCULAR | Status: AC
  Administered 2011-02-11: 60 mg via INTRAMUSCULAR

## 2011-02-11 MED ORDER — PROCHLORPERAZINE MALEATE 10 MG PO TABS: 10.0000 mg | ORAL_TABLET | Freq: Four times a day (QID) | ORAL | Status: AC | PRN

## 2011-02-11 MED ORDER — CYCLOBENZAPRINE HCL 10 MG PO TABS: 10.0000 mg | ORAL_TABLET | Freq: Three times a day (TID) | ORAL | Status: AC | PRN

## 2011-02-11 MED ORDER — PROMETHAZINE HCL 25 MG/ML IJ SOLN
25.0000 mg | Freq: Once | INTRAMUSCULAR | Status: AC
  Administered 2011-02-11: 25 mg via INTRAMUSCULAR

## 2011-02-11 MED ORDER — SUMATRIPTAN SUCCINATE 50 MG PO TABS: ORAL_TABLET | ORAL | Status: DC

## 2011-02-11 NOTE — Progress Notes (Signed)
30 year old female presenting to clinic today with 1.5 weeks of migraine headache pain. She was seen at [facility] emergency room and given a headache cocktail and a prescription for intranasal Imitrex which she has been unable to fill. Her headache has persisted off and on over the last 10 days or so. The last 2 days her headache is worsened. She describes her headache as throbbing unilateral and associated with a visual aura and nausea and vomiting. She denies any numbness weakness lack of coordination problems speaking or swallowing loss of bowel or bladder dysfunction. She has been taking her friend's tryptan type of medication which has not helped much. Additionally she notes pain and spasm of her neck muscles and trapezius during the same time period.   PMH reviewed.  ROS as above otherwise neg Medications reviewed. Current Outpatient Prescriptions  Medication Sig Dispense Refill  . acetaminophen (TYLENOL) 500 MG tablet Take 2,000 mg by mouth as needed. For pain       . cyclobenzaprine (FLEXERIL) 10 MG tablet Take 1 tablet (10 mg total) by mouth every 8 (eight) hours as needed for muscle spasms.  30 tablet  1  . prochlorperazine (COMPAZINE) 10 MG tablet Take 1 tablet (10 mg total) by mouth every 6 (six) hours as needed.  30 tablet  2  . SUMAtriptan (IMITREX) 50 MG tablet 1 pill at start of migraine symptoms. If no improvement 2 pills 2 hours later.  10 tablet  3  . verapamil (CALAN-SR) 120 MG CR tablet Take 1 tablet (120 mg total) by mouth daily.  30 tablet  1   Exam:  BP 120/70  Temp(Src) 97.7 F (36.5 C) (Oral)  Ht 5\' 4"  (1.626 m)  Wt 199 lb (90.266 kg)  BMI 34.16 kg/m2  LMP 01/13/2011 Gen: Well NAD, in pain appearing woman HEENT: EOMI,  MMM, pupils equal round reactive to light. Funduscopic exam does not show papilledema. Lungs: CTABL Nl WOB Heart: RRR no MRG Abd: NABS, NT, ND Exts: Non edematous BL  LE, warm and well perfused.  Neuro: Alert and oriented x3 cranial nerves II  through XII are intact sensation strength and reflexes are intact and equal. Coordination is intact. Gait is normal negative Romberg.  Neck: Normal neck range of motion. Sternocleidomastoid muscles bilaterally are and mild spasm

## 2011-02-11 NOTE — Patient Instructions (Signed)
Thank you for coming in today. I think you have migraines.  I prescribed Imitrex to use at the start of a migraine. Take one pill at the start and he still has symptoms take 2 pills 2 hours later. I prescribed Compazine for nausea. I also prescribed verapamil to take daily to prevent migraines. Flexeril is available for muscle spasm. See your doctor in one week.\   Migraine Headache A migraine headache is an intense, throbbing pain on one or both sides of your head. The exact cause of a migraine headache is not always known. A migraine may be caused when nerves in the brain become irritated and release chemicals that cause swelling within blood vessels, causing pain. Many migraine sufferers have a family history of migraines. Before you get a migraine you may or may not get an aura. An aura is a group of symptoms that can predict the beginning of a migraine. An aura may include:  Visual changes such as:     Flashing lights.     Bright spots or zig-zag lines.     Tunnel vision.     Feelings of numbness.     Trouble talking.     Muscle weakness.  SYMPTOMS  Pain on one or both sides of your head.     Pain that is pulsating or throbbing in nature.     Pain that is severe enough to prevent daily activities.     Pain that is aggravated by any daily physical activity.     Nausea (feeling sick to your stomach), vomiting, or both.     Pain with exposure to bright lights, loud noises, or activity.     General sensitivity to bright lights or loud noises.  MIGRAINE TRIGGERS Examples of triggers of migraine headaches include:    Alcohol.     Smoking.    Stress.    It may be related to menses (female menstruation).     Aged cheeses.     Foods or drinks that contain nitrates, glutamate, aspartame, or tyramine.     Lack of sleep.     Chocolate.    Caffeine.    Hunger.    Medications such as nitroglycerine (used to treat chest pain), birth control pills, estrogen, and  some blood pressure medications.  DIAGNOSIS   A migraine headache is often diagnosed based on:  Symptoms.     Physical examination.     A computerized X-ray scan (computed tomography, CT) of your head.  TREATMENT  Medications can help prevent migraines if they are recurrent or should they become recurrent. Your caregiver can help you with a medication or treatment program that will be helpful to you.     Lying down in a dark, quiet room may be helpful.     Keeping a headache diary may help you find a trend as to what may be triggering your headaches.  SEEK IMMEDIATE MEDICAL CARE IF:    You have confusion, personality changes or seizures.     You have headaches that wake you from sleep.     You have an increased frequency in your headaches.     You have a stiff neck.     You have a loss of vision.     You have muscle weakness.     You start losing your balance or have trouble walking.     You feel faint or pass out.  MAKE SURE YOU:    Understand these instructions.  Will watch your condition.     Will get help right away if you are not doing well or get worse.  Document Released: 02/17/2005 Document Revised: 10/30/2010 Document Reviewed: 10/03/2008 [facility] Patient Information 2012 [place], [place].

## 2011-02-11 NOTE — Assessment & Plan Note (Signed)
Severe migraine without red flags suggestive of severe intracranial pathology or cervical pathology.  Will treat with headache cocktail now consisting of  IM Toradol, Phenergan, dexamethasone. Will prescribe sumatriptan, Compazine, Flexeril for headache relief. Additionally we'll prescribe verapamil 120 mg daily for headache prophylaxis. We chose this calcium channel blocker for its low side effect profile. Patient will followup with her primary care provider in one week. She will followup sooner if she expresses red flag signs or symptoms.

## 2011-02-11 NOTE — Assessment & Plan Note (Signed)
Neck pain likely due to musculoskeletal spasm versus strain. Will prescribe Flexeril. Normal reflexes and neck range of motion present.

## 2011-02-16 ENCOUNTER — Encounter (HOSPITAL_BASED_OUTPATIENT_CLINIC_OR_DEPARTMENT_OTHER): Payer: Self-pay | Admitting: *Deleted

## 2011-02-16 ENCOUNTER — Emergency Department (HOSPITAL_BASED_OUTPATIENT_CLINIC_OR_DEPARTMENT_OTHER)
Admission: EM | Admit: 2011-02-16 | Discharge: 2011-02-16 | Disposition: A | Discharge status: Home / Self Care | Payer: Medicaid Other | Attending: Emergency Medicine | Admitting: Emergency Medicine

## 2011-02-16 DIAGNOSIS — R51 Headache: Secondary | ICD-10-CM | POA: Insufficient documentation

## 2011-02-16 DIAGNOSIS — R110 Nausea: Secondary | ICD-10-CM | POA: Insufficient documentation

## 2011-02-16 DIAGNOSIS — F17200 Nicotine dependence, unspecified, uncomplicated: Secondary | ICD-10-CM | POA: Insufficient documentation

## 2011-02-16 MED ORDER — METOCLOPRAMIDE HCL 5 MG/ML IJ SOLN
10.0000 mg | Freq: Once | INTRAMUSCULAR | Status: AC
  Administered 2011-02-16: 10 mg via INTRAVENOUS

## 2011-02-16 MED ORDER — SODIUM CHLORIDE 0.9 % IV SOLN
Freq: Once | INTRAVENOUS | Status: AC
  Administered 2011-02-16: 17:00:00 via INTRAVENOUS

## 2011-02-16 MED ORDER — KETOROLAC TROMETHAMINE 60 MG/2ML IM SOLN
60.0000 mg | Freq: Once | INTRAMUSCULAR | Status: DC
  Filled 2011-02-16: qty 2

## 2011-02-16 MED ORDER — DIPHENHYDRAMINE HCL 50 MG/ML IJ SOLN
25.0000 mg | Freq: Once | INTRAMUSCULAR | Status: DC
  Filled 2011-02-16: qty 1

## 2011-02-16 MED ORDER — SODIUM CHLORIDE 0.9 % IV BOLUS (SEPSIS)
1000.0000 mL | Freq: Once | INTRAVENOUS | Status: AC
  Administered 2011-02-16: 1000 mL via INTRAVENOUS

## 2011-02-16 MED ORDER — DIPHENHYDRAMINE HCL 50 MG/ML IJ SOLN
25.0000 mg | Freq: Once | INTRAMUSCULAR | Status: AC
  Administered 2011-02-16: 25 mg via INTRAVENOUS

## 2011-02-16 MED ORDER — KETOROLAC TROMETHAMINE 30 MG/ML IJ SOLN
30.0000 mg | Freq: Once | INTRAMUSCULAR | Status: AC
  Administered 2011-02-16: 30 mg via INTRAVENOUS

## 2011-02-16 MED ORDER — METOCLOPRAMIDE HCL 5 MG/ML IJ SOLN
10.0000 mg | Freq: Once | INTRAMUSCULAR | Status: DC
  Filled 2011-02-16: qty 2

## 2011-02-16 MED ORDER — KETOROLAC TROMETHAMINE 30 MG/ML IJ SOLN
INTRAMUSCULAR | Status: AC
  Administered 2011-02-16: 30 mg via INTRAVENOUS
  Filled 2011-02-16: qty 1

## 2011-02-16 NOTE — ED Notes (Signed)
Pt states she has a hx of migraines and has had this one since yesterday. Seen at Dr office on Tuesday. Given meds which ease, but don't take away.

## 2011-02-16 NOTE — ED Provider Notes (Signed)
History     CSN: [id number] Arrival date & time: 02/16/2011  3:38 PM   First MD Initiated Contact with Patient 02/16/11 1600      Chief Complaint  Patient presents with  . Migraine    (Consider location/radiation/quality/duration/timing/severity/associated sxs/prior treatment) Patient is a 30 y.o. female presenting with headaches. The history is provided by the patient. No language interpreter was used.  Headache  This is a recurrent problem. The current episode started more than 1 week ago. The problem occurs hourly. The problem has not changed since onset.The headache is associated with bright light. The quality of the pain is described as throbbing. The pain is moderate. The pain radiates to the left neck and right neck. Associated symptoms include nausea. Pertinent negatives include no fever, no palpitations and no syncope. Treatments tried: pt states that she has been seem multiple times and tried different medications and she gets temporary improvement.    Past Medical History  Diagnosis Date  . Migraine   . Back pain   . Arthritis     Past Surgical History  Procedure Date  . Cesarean section   . Tubal ligation   . 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 Tonsillectomy   . Adenoidectomy   . Nasal sinus surgery     Family History  Problem Relation Age of Onset  . Cancer Maternal Grandmother     breast ca, cervical  . Cancer Paternal Grandmother     breast ca  . Alcohol abuse Father     History  Substance Use Topics  . Smoking status: Current Some Day Smoker -- 0.5 packs/day  . Smokeless tobacco: Not on file  . Alcohol Use: Yes    OB History    Grav Para Term Preterm Abortions TAB SAB Ect Mult Living                  Review of Systems  Constitutional: Negative for fever.  Cardiovascular: Negative for palpitations and syncope.  Gastrointestinal: Positive for nausea.  Neurological: Positive for headaches.  All other systems  reviewed and are negative.    Allergies  Review of patient's allergies indicates no known allergies.  Home Medications   Current Outpatient Rx  Name Route Sig Dispense Refill  . ACETAMINOPHEN 500 MG PO TABS Oral Take 2,000 mg by mouth as needed. For pain     . CYCLOBENZAPRINE HCL 10 MG PO TABS Oral Take 1 tablet (10 mg total) by mouth every 8 (eight) hours as needed for muscle spasms. 30 tablet 1  . PROCHLORPERAZINE MALEATE 10 MG PO TABS Oral Take 1 tablet (10 mg total) by mouth every 6 (six) hours as needed. 30 tablet 2  . SUMATRIPTAN SUCCINATE 50 MG PO TABS  1 pill at start of migraine symptoms. If no improvement 2 pills 2 hours later. 10 tablet 3  . VERAPAMIL HCL ER 120 MG PO TBCR Oral Take 1 tablet (120 mg total) by mouth daily. 30 tablet 1    BP 123/61  Pulse 87  Temp(Src) 97.8 F (36.6 C) (Oral)  Resp 16  Ht 5\' 4"  (1.626 m)  Wt 195 lb (88.451 kg)  BMI 33.47 kg/m2  SpO2 100%  LMP 02/09/2011  Physical Exam  Nursing note and vitals reviewed. Constitutional: She is oriented to person, place, and time. She appears well-developed and well-nourished.  HENT:  Head: Normocephalic and atraumatic.  Right Ear: External ear normal.  Left Ear: External ear  normal.  Eyes: EOM are normal. Pupils are equal, round, and reactive to light.  Neck: Normal range of motion. Neck supple.  Cardiovascular: Normal rate and regular rhythm.   Pulmonary/Chest: Effort normal and breath sounds normal.  Musculoskeletal: Normal range of motion.  Neurological: She is alert and oriented to person, place, and time.  Skin: Skin is warm and dry.  Psychiatric: She has a normal mood and affect.    ED Course  Procedures (including critical care time)  Labs Reviewed - No data to display No results found.   1. Headache       MDM  Pt feeling a lot better and is ready to go home at this time        Teressa Lower, NP 02/16/11 1745

## 2011-02-16 NOTE — ED Notes (Signed)
D/c home with ride- no new rx given rates pain 4/10

## 2011-02-17 ENCOUNTER — Ambulatory Visit (INDEPENDENT_AMBULATORY_CARE_PROVIDER_SITE_OTHER): Payer: Medicaid Other | Admitting: Family Medicine

## 2011-02-17 ENCOUNTER — Encounter: Payer: Self-pay | Admitting: Family Medicine

## 2011-02-17 DIAGNOSIS — Z23 Encounter for immunization: Secondary | ICD-10-CM

## 2011-02-17 DIAGNOSIS — G43909 Migraine, unspecified, not intractable, without status migrainosus: Secondary | ICD-10-CM

## 2011-02-17 MED ORDER — TOPIRAMATE 25 MG PO TABS: 25.0000 mg | ORAL_TABLET | Freq: Every day | ORAL | Status: DC
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
## 2011-02-17 NOTE — Patient Instructions (Signed)
It has been a pleasure to see you today. Please take the medication as prescribed. Make your next appointment in 3 weeks or sooner if your headache does not improve with treatment.

## 2011-02-17 NOTE — Assessment & Plan Note (Addendum)
Per her symptoms this appears to be an exacerbation of her migraine episodes. Afebrile, no source of infection, negative physical exam. Plan: Monitor headache closely, red flags discussed. Consider imaging if this continues. Stop Verapamil since pt BP is low today. (99/70 mmHg) Start Topamax 25 mg QHS anti migraine therapy. Intention to increased dose if needed to 25mg   BID ( max dose 50mg  BID) Discussed secondary effects with her bipolar disorder. Pt aware and understanding of when to seek medical help. Continue rest of tretament.

## 2011-02-19 NOTE — ED Provider Notes (Signed)
Medical screening examination/treatment/procedure(s) were performed by non-physician practitioner and as supervising physician I was immediately available for consultation/collaboration.  Mikaelyn Arthurs T Chamara Dyck, MD 02/19/11 1111 

## 2011-03-06 ENCOUNTER — Ambulatory Visit (INDEPENDENT_AMBULATORY_CARE_PROVIDER_SITE_OTHER): Payer: Medicaid Other | Admitting: Family Medicine

## 2011-03-06 ENCOUNTER — Encounter: Payer: Self-pay | Admitting: Family Medicine

## 2011-03-06 VITALS — BP 108/72 | HR 71 | Temp 97.9°F | Ht 64.0 in | Wt 197.8 lb

## 2011-03-06 DIAGNOSIS — G43109 Migraine with aura, not intractable, without status migrainosus: Secondary | ICD-10-CM | POA: Insufficient documentation

## 2011-03-06 DIAGNOSIS — G43519 Persistent migraine aura without cerebral infarction, intractable, without status migrainosus: Secondary | ICD-10-CM

## 2011-03-06 MED ORDER — MORPHINE SULFATE 10 MG/ML IJ SOLN
10.0000 mg | Freq: Once | INTRAMUSCULAR | Status: AC
  Administered 2011-03-06: 10 mg via INTRAMUSCULAR

## 2011-03-06 MED ORDER — KETOROLAC TROMETHAMINE 30 MG/ML IJ SOLN
30.0000 mg | Freq: Once | INTRAMUSCULAR | Status: AC
  Administered 2011-03-06: 30 mg via INTRAMUSCULAR

## 2011-03-06 MED ORDER — DEXAMETHASONE SODIUM PHOSPHATE 4 MG/ML IJ SOLN
4.0000 mg | Freq: Once | INTRAMUSCULAR | Status: AC
  Administered 2011-03-06: 4 mg via INTRAMUSCULAR

## 2011-03-06 NOTE — Assessment & Plan Note (Addendum)
Migraine cocktail given today. Toradol 30 mg Morphine 2 mg Dexamethasone 4 mg  Referral to neurology for failed treatment. Tried topamax/imitrex.  Lie in darkened room and apply cold packs as needed for pain. Avoid alcohol, aspirin and OTC NSAIDs, smoking, stressful situations as much as possible and the use of illicit or street drugs. Patient reassured that neurodiagnostic workup not indicated from benign H&P.

## 2011-03-06 NOTE — Patient Instructions (Signed)
It was great to see you today!  Schedule an appointment to see me as needed.  I have referred you to a neurologist to get a second opinion about your migraine headaches.  Because of the medications we administered today in office you cannot drive home and should stay home and rest until tomorrow morning.

## 2011-03-06 NOTE — Progress Notes (Signed)
  Subjective:  1. Migraine f/u   Erika [name] is a 31 y.o. female who presents for follow-up of migraine headaches. Home treatment has included Imitrex oral and topamax and Goodys powder with little improvement. Headaches are occurring continuously for the last month. Generally, the headaches last about 1 week. Work attendance or other daily activities are affected by the headaches. The patient denies dizziness, muscle weakness, speech difficulties and vision problems. She had a CT head done in July of 2012 that was normal. Her neurological exam was negative. She has no known history of drug abuse/alcoholism/or pain medication seeking activity.  Review of Systems A comprehensive review of systems was negative.  No recent illness. No sinusitis. No fever, chills, night sweats.  No visual change, no ringing in the ears, no vertigo.  Objective:   Filed Vitals:   03/06/11 1129  BP: 108/72  Pulse: 71  Temp: 97.9 F (36.6 C)  TempSrc: Oral  Height: 5\' 4"  (1.626 m)  Weight: 197 lb 12.8 oz (89.721 kg)  Neurologic exam : Cn 2-7 intact Strength equal & normal in upper & lower extremities Balance normal  Romberg normal, finger to nose  Reflexes brisk, but normal PERRLA Fundi: normal, no papilledema. Neck:  No deformities, thyromegaly, masses, or tenderness noted.   Supple with full range of motion without pain.   Assessment:

## 2011-03-07 ENCOUNTER — Encounter: Payer: Self-pay | Admitting: *Deleted

## 2011-03-12 NOTE — Telephone Encounter (Signed)
This encounter was created in error - please disregard.

## 2011-05-12 ENCOUNTER — Ambulatory Visit (INDEPENDENT_AMBULATORY_CARE_PROVIDER_SITE_OTHER): Payer: Medicaid Other | Admitting: Family Medicine

## 2011-05-12 ENCOUNTER — Encounter: Payer: Self-pay | Admitting: Family Medicine

## 2011-05-12 VITALS — BP 100/64 | HR 76 | Temp 98.0°F | Ht 64.0 in | Wt 193.0 lb

## 2011-05-12 DIAGNOSIS — N764 Abscess of vulva: Secondary | ICD-10-CM

## 2011-05-12 MED ORDER — SULFAMETHOXAZOLE-TRIMETHOPRIM 800-160 MG PO TABS: 2.0000 | ORAL_TABLET | Freq: Two times a day (BID) | ORAL | Status: AC

## 2011-05-12 MED ORDER — SULFAMETHOXAZOLE-TRIMETHOPRIM 800-160 MG PO TABS: 1.0000 | ORAL_TABLET | Freq: Two times a day (BID) | ORAL | Status: DC

## 2011-05-12 NOTE — Patient Instructions (Addendum)
Take the antibiotic once in the morning and once the evening with food. Come back and see me on Friday. If you feel like you're not getting better, it's getting worse come back in earlier. If it is totally gone at that point you can cancel that appointment Stop the medicine at any signs of rash.

## 2011-05-14 DIAGNOSIS — N764 Abscess of vulva: Secondary | ICD-10-CM | POA: Insufficient documentation

## 2011-05-14 NOTE — Progress Notes (Signed)
Subjective:    Patient ID: Erika [name], female    DOB: 1980-11-19, 31 y.o.   MRN: [id number]  HPI  Boil in vagina:  Patient states she's had a boil on the left labia for the past 2 days. She states is very difficult precipitated the pain. She states she did recently shave the area a few days ago. She's had no systemic symptoms such as fever chills nausea or vomiting. She has not tried anything for relief.. She's not had any drainage.  Review of Systems See HPI above for review of systems.       Objective:   Physical Exam Gen:  Alert, cooperative patient who appears stated age in no acute distress.  Vital signs reviewed. Mouth: MMM GU:  Nurse chaperone present for entire exam. Left labia reveals 3 mm erythematous papule. No areas of fluctuance. No induration. Tender to palpation. No other lesions noted. Rest of external genitalia within normal limits.       Assessment & [name]:

## 2011-05-14 NOTE — Assessment & Plan Note (Signed)
It is very small today. I do not palpate anything that could be drained. We will try Bactrim double strength 2 pills a day next 7 days. She is to follow up on Friday which is in 5 days for assessment of potential drainage. She may cancel that appointment if it completely resolves.

## 2011-05-16 ENCOUNTER — Ambulatory Visit: Payer: Medicaid Other | Admitting: Family Medicine

## 2011-05-28 ENCOUNTER — Encounter: Payer: Self-pay | Admitting: Family Medicine

## 2011-05-28 ENCOUNTER — Ambulatory Visit (INDEPENDENT_AMBULATORY_CARE_PROVIDER_SITE_OTHER): Payer: Medicaid Other | Admitting: Family Medicine

## 2011-05-28 VITALS — BP 133/79 | HR 85 | Temp 97.5°F | Ht 64.0 in | Wt 196.6 lb

## 2011-05-28 DIAGNOSIS — J069 Acute upper respiratory infection, unspecified: Secondary | ICD-10-CM

## 2011-05-28 DIAGNOSIS — J029 Acute pharyngitis, unspecified: Secondary | ICD-10-CM

## 2011-05-28 LAB — POCT RAPID STREP A (OFFICE): Rapid Strep A Screen: NEGATIVE

## 2011-05-28 NOTE — Patient Instructions (Signed)
Your strep test was negative you have a bad viral infection  Use tylenol or ibuprofen regularly for the muscle aches   Drink plenty of fluids  You should be better in 7 days and well in 2-3 weeks  Call if you develp fever > 102 or are feeling very short of breath

## 2011-05-28 NOTE — Progress Notes (Signed)
Subjective:    Patient ID: Erika [name], female    DOB: 08/03/1980, 31 y.o.   MRN: [id number]  HPI  URI 2 days of achy muscles, runny nose, mild sore throat, fatigue.  No fever or chest pain or sputum.  Mild pain in left neck and ear.  Her kids diagnosed as strep throat receently  Review of Symptoms - see HPI  PMH - Smoking status noted.    Review of Systems     Objective:   Physical Exam  Alert no acute distress Heart - Regular rate and rhythm.  No murmurs, gallops or rubs.    Lungs:  Normal respiratory effort, chest expands symmetrically. Lungs are clear to auscultation, no crackles or wheezes. Neck:  No deformities, thyromegaly, mild adenopathy on R side    Supple with full range of motion without pain. Ears:  External ear exam shows no significant lesions or deformities.  Otoscopic examination reveals clear canals, tympanic membranes are intact bilaterally without bulging, retraction, inflammation or discharge. Hearing is grossly normal bilaterall Nose:  External nasal examination shows no deformity Nasal mucosa is red with clear discharge Skin:  Intact without suspicious lesions or rashes      Assessment & [name]:

## 2011-05-28 NOTE — Assessment & Plan Note (Signed)
New - most consistent with viral cause.  Doubt true flu without fever.  No signs of pneumonia or UTI or sinusitis.  Treat symptomatically

## 2011-06-05 ENCOUNTER — Ambulatory Visit (INDEPENDENT_AMBULATORY_CARE_PROVIDER_SITE_OTHER): Payer: Medicaid Other | Admitting: Family Medicine

## 2011-06-05 ENCOUNTER — Encounter: Payer: Self-pay | Admitting: Family Medicine

## 2011-06-05 VITALS — BP 119/75 | HR 77 | Temp 97.9°F | Wt 198.0 lb

## 2011-06-05 DIAGNOSIS — K5900 Constipation, unspecified: Secondary | ICD-10-CM | POA: Insufficient documentation

## 2011-06-05 DIAGNOSIS — J029 Acute pharyngitis, unspecified: Secondary | ICD-10-CM

## 2011-06-05 DIAGNOSIS — J069 Acute upper respiratory infection, unspecified: Secondary | ICD-10-CM

## 2011-06-05 LAB — POCT RAPID STREP A (OFFICE): Rapid Strep A Screen: NEGATIVE

## 2011-06-05 MED ORDER — FLUCONAZOLE 150 MG PO TABS: 150.0000 mg | ORAL_TABLET | Freq: Once | ORAL | Status: AC

## 2011-06-05 MED ORDER — AMOXICILLIN-POT CLAVULANATE 875-125 MG PO TABS: 1.0000 | ORAL_TABLET | Freq: Two times a day (BID) | ORAL | Status: AC

## 2011-06-05 NOTE — Assessment & Plan Note (Signed)
New symptoms--left ear and left throat pain--, persistent significant fatigue, and oropharyngeal and left TM erythema concerning for superimposed bacterial process. Will start course of Augmentin x7days. Will give work-note through this weekend.  Follow-up in 1 week if symptoms not improved.

## 2011-06-05 NOTE — Progress Notes (Signed)
Subjective:    Patient ID: Erika [name], female    DOB: 17-Jan-1981, 31 y.o.   MRN: [id number]  HPI Follow-up of persistent URI symptoms. Patient seen 03/27 for URI symptoms that had started on 03/25. Diagnosed as likely being viral.  Patient comes in today due to persistent fatigue and now with left ear and left throat pain that started a few days ago. She has had to stay home for the past several days due to feeling tired.  ROS: denies fevers/chills/nausea (different from usual nausea from migraines), worsening of headaches, abdominal pain, rash, difficulty breathing  2. Constipation She has been having about 1 bowel movement a week recently.  She says she drinks a lot of water (equivalent to 8-10 glasses a day).   Review of Systems Per HPI    Objective:   Physical Exam Gen: laying on exam table, appears tired and uncomfortable; accompanied by boyfriend and daughter HEENT:   Eyes: normal conjunctiva   Ears: left TM mildly erythematous without effusion; right normal   Nose: nasal congestion without active rhinorrhea   Throat: oropharynx injected; no tonsils (removed)   Neck: no palpable LAD CV: RRR Pulm: CTAB without w/r/r Abd: NABS, soft, NT, obese, no HSM Ext: 1-2 sec capillary refill; warm, dry Skin: no rash  Rapid strep: negative    Assessment & [name]:

## 2011-06-05 NOTE — Assessment & Plan Note (Signed)
Has been going on for several months. 1 BM weekly. Encouraged hydration, high fiber diet. Miralax prn.

## 2011-06-05 NOTE — Patient Instructions (Signed)
You may have a bacterial ear infection/pharyngitis.  Try the antibiotic (augmentin) twice a day for 5 days. Take an over-the-counter probiotic while on the antibiotic to prevent diarrhea. I am also send a prescription for yeast.   Follow-up in 1 week if you are still feeling unwell.  Try increasing fiber in your diet. If you are still constipated, try Miralax (laxative).  Constipation in Adults Constipation is having fewer than 2 bowel movements per week. Usually, the stools are hard. As we grow older, constipation is more common. If you try to fix constipation with laxatives, the problem may get worse. This is because laxatives taken over a long period of time make the colon muscles weaker. A low-fiber diet, not taking in enough fluids, and taking some medicines may make these problems worse. MEDICATIONS THAT MAY CAUSE CONSTIPATION  Water pills (diuretics).   Calcium channel blockers (used to control blood pressure and for the heart).   Certain pain medicines (narcotics).   Anticholinergics.   Anti-inflammatory agents.   Antacids that contain aluminum.  DISEASES THAT CONTRIBUTE TO CONSTIPATION  Diabetes.   Parkinson's disease.   Dementia.   Stroke.   Depression.   Illnesses that cause problems with salt and water metabolism.  HOME CARE INSTRUCTIONS   Constipation is usually best cared for without medicines. Increasing dietary fiber and eating more fruits and vegetables is the best way to manage constipation.   Slowly increase fiber intake to 25 to 38 grams per day. Whole grains, fruits, vegetables, and legumes are good sources of fiber. A dietitian can further help you incorporate high-fiber foods into your diet.   Drink enough water and fluids to keep your urine clear or pale yellow.   A fiber supplement may be added to your diet if you cannot get enough fiber from foods.   Increasing your activities also helps improve regularity.   Suppositories, as suggested by  your caregiver, will also help. If you are using antacids, such as aluminum or calcium containing products, it will be helpful to switch to products containing magnesium if your caregiver says it is okay.   If you have been given a liquid injection (enema) today, this is only a temporary measure. It should not be relied on for treatment of longstanding (chronic) constipation.   Stronger measures, such as magnesium sulfate, should be avoided if possible. This may cause uncontrollable diarrhea. Using magnesium sulfate may not allow you time to make it to the bathroom.  SEEK IMMEDIATE MEDICAL CARE IF:   There is bright red blood in the stool.   The constipation stays for more than 4 days.   There is belly (abdominal) or rectal pain.   You do not seem to be getting better.   You have any questions or concerns.  MAKE SURE YOU:   Understand these instructions.   Will watch your condition.   Will get help right away if you are not doing well or get worse.  Document Released: 11/16/2003 Document Revised: 02/06/2011 Document Reviewed: 01/21/2011 [facility] Patient Information 2012 [place], [place].

## 2011-07-18 ENCOUNTER — Ambulatory Visit (INDEPENDENT_AMBULATORY_CARE_PROVIDER_SITE_OTHER): Payer: Medicaid Other | Admitting: Family Medicine

## 2011-07-18 ENCOUNTER — Encounter: Payer: Self-pay | Admitting: Family Medicine

## 2011-07-18 ENCOUNTER — Other Ambulatory Visit (HOSPITAL_COMMUNITY)
Admission: RE | Admit: 2011-07-18 | Discharge: 2011-07-18 | Disposition: A | Discharge status: Home / Self Care | Payer: Medicaid Other | Source: Ambulatory Visit | Attending: Emergency Medicine | Admitting: Emergency Medicine

## 2011-07-18 DIAGNOSIS — N760 Acute vaginitis: Secondary | ICD-10-CM

## 2011-07-18 DIAGNOSIS — N739 Female pelvic inflammatory disease, unspecified: Secondary | ICD-10-CM

## 2011-07-18 DIAGNOSIS — B9689 Other specified bacterial agents as the cause of diseases classified elsewhere: Secondary | ICD-10-CM

## 2011-07-18 DIAGNOSIS — Z113 Encounter for screening for infections with a predominantly sexual mode of transmission: Secondary | ICD-10-CM | POA: Insufficient documentation

## 2011-07-18 DIAGNOSIS — A499 Bacterial infection, unspecified: Secondary | ICD-10-CM

## 2011-07-18 LAB — POCT WET PREP (WET MOUNT)

## 2011-07-18 MED ORDER — METRONIDAZOLE 0.75 % VA GEL: 1.0000 | Freq: Every day | VAGINAL | Status: DC

## 2011-07-18 MED ORDER — CEFTRIAXONE SODIUM 1 G IJ SOLR
250.0000 mg | Freq: Once | INTRAMUSCULAR | Status: AC
  Administered 2011-07-18: 250 mg via INTRAMUSCULAR

## 2011-07-18 MED ORDER — DOXYCYCLINE HYCLATE 100 MG PO TABS: 100.0000 mg | ORAL_TABLET | Freq: Two times a day (BID) | ORAL | Status: AC

## 2011-07-18 NOTE — Patient Instructions (Signed)
Pelvic Inflammatory Disease Pelvic Inflammatory Disease (PID) is an infection in some or all of your female organs. This includes the womb (uterus), ovaries, fallopian tubes and tissues in the pelvis. PID is a common cause of sudden onset (acute) lower abdominal (pelvic) pain. PID can be treated, but it is a serious infection. It may take weeks before you are completely well. In some cases, hospitalization is needed for surgery or to administer medications to kill germs (antibiotics) through your veins (intravenously). CAUSES   It may be caused by germs that are spread during sexual contact.   PID can also occur following:   The birth of a baby.   A miscarriage.   An abortion.   Major surgery of the pelvis.   Use of an IUD.   Sexual assault.  SYMPTOMS   Abdominal or pelvic pain.   Fever.   Chills.   Abnormal vaginal discharge.  DIAGNOSIS  Your caregiver will choose some of these methods to make a diagnosis:  A physical exam and history.   Blood tests.   Cultures of the vagina and cervix.   X-rays or ultrasound.   A procedure to look inside the pelvis (laparoscopy).  TREATMENT   Use of antibiotics by mouth or intravenously.   Treatment of sexual partners when the infection is an sexually transmitted disease (STD).   Hospitalization and surgery may be needed.  RISKS AND COMPLICATIONS   PID can cause women to become unable to have children (sterile) if left untreated or if partially treated. That is why it is important to finish all medications given to you.   Sterility or future tubal (ectopic) pregnancies can occur in fully treated individuals. This is why it is so important to follow your prescribed treatment.   It can cause longstanding (chronic) pelvic pain after frequent infections.   Painful intercourse.   Pelvic abscesses.   In rare cases, surgery or a hysterectomy may be needed.   If this is a sexually transmitted infection (STI), you are also at  risk for any other STD including AIDSor human papillomavirus (HPV).  HOME CARE INSTRUCTIONS   Finish all medication as prescribed. Incomplete treatment will put you at risk for sterility and tubal pregnancy.   Only take over-the-counter or prescription medicines for pain, discomfort, or fever as directed by your caregiver.   Do not have sex until treatment is completed or as directed by your caregiver. If PID is confirmed, your recent sexual contacts will need treatment.   Keep your follow-up appointments.  SEEK MEDICAL CARE IF:   You have increased or abnormal vaginal discharge.   You need prescription medication for your pain.   Your partner has an STD.   You are vomiting.   You cannot take your medications.  SEEK IMMEDIATE MEDICAL CARE IF:   You have a fever.   You develop increased abdominal or pelvic pain.   You develop chills.   You have pain when you urinate.   You are not better after 72 hours following treatment.  Document Released: 02/17/2005 Document Revised: 02/06/2011 Document Reviewed: 10/31/2006 [facility] Patient Information 2012 [place], [place].   Bacterial Vaginosis Bacterial vaginosis (BV) is a vaginal infection where the normal balance of bacteria in the vagina is disrupted. The normal balance is then replaced by an overgrowth of certain bacteria. There are several different kinds of bacteria that can cause BV. BV is the most common vaginal infection in women of childbearing age. CAUSES   The cause of BV is  not fully understood. BV develops when there is an increase or imbalance of harmful bacteria.   Some activities or behaviors can upset the normal balance of bacteria in the vagina and put women at increased risk including:   Having a new sex partner or multiple sex partners.   Douching.   Using an intrauterine device (IUD) for contraception.   It is not clear what role sexual activity plays in the development of BV. However, women that have never  had sexual intercourse are rarely infected with BV.  Women do not get BV from toilet seats, bedding, swimming pools or from touching objects around them.  SYMPTOMS   Grey vaginal discharge.   A fish-like odor with discharge, especially after sexual intercourse.   Itching or burning of the vagina and vulva.   Burning or pain with urination.   Some women have no signs or symptoms at all.  DIAGNOSIS  Your caregiver must examine the vagina for signs of BV. Your caregiver will perform lab tests and look at the sample of vaginal fluid through a microscope. They will look for bacteria and abnormal cells (clue cells), a pH test higher than 4.5, and a positive amine test all associated with BV.  RISKS AND COMPLICATIONS   Pelvic inflammatory disease (PID).   Infections following gynecology surgery.   Developing HIV.   Developing herpes virus.  TREATMENT  Sometimes BV will clear up without treatment. However, all women with symptoms of BV should be treated to avoid complications, especially if gynecology surgery is planned. Female partners generally do not need to be treated. However, BV may spread between female sex partners so treatment is helpful in preventing a recurrence of BV.   BV may be treated with antibiotics. The antibiotics come in either pill or vaginal cream forms. Either can be used with nonpregnant or pregnant women, but the recommended dosages differ. These antibiotics are not harmful to the baby.   BV can recur after treatment. If this happens, a second round of antibiotics will often be prescribed.   Treatment is important for pregnant women. If not treated, BV can cause a premature delivery, especially for a pregnant woman who had a premature birth in the past. All pregnant women who have symptoms of BV should be checked and treated.   For chronic reoccurrence of BV, treatment with a type of prescribed gel vaginally twice a week is helpful.  HOME CARE INSTRUCTIONS   Finish  all medication as directed by your caregiver.   Do not have sex until treatment is completed.   Tell your sexual partner that you have a vaginal infection. They should see their caregiver and be treated if they have problems, such as a mild rash or itching.   Practice safe sex. Use condoms. Only have 1 sex partner.  PREVENTION  Basic prevention steps can help reduce the risk of upsetting the natural balance of bacteria in the vagina and developing BV:  Do not have sexual intercourse (be abstinent).   Do not douche.   Use all of the medicine prescribed for treatment of BV, even if the signs and symptoms go away.   Tell your sex partner if you have BV. That way, they can be treated, if needed, to prevent reoccurrence.  SEEK MEDICAL CARE IF:   Your symptoms are not improving after 3 days of treatment.   You have increased discharge, pain, or fever.  MAKE SURE YOU:   Understand these instructions.   Will watch your condition.  Will get help right away if you are not doing well or get worse.  FOR MORE INFORMATION  Division of STD Prevention (DSTDP), Centers for Disease Control and Prevention: [employer] American Social Health Association (ASHA): www.ashastd.org  Document Released: 02/17/2005 Document Revised: 02/06/2011 Document Reviewed: 08/10/2008 [facility] Patient Information 2012 [place], [place].

## 2011-07-18 NOTE — Progress Notes (Signed)
Subjective:    Patient ID: Erika [name], female    DOB: 23-Oct-1980, 31 y.o.   MRN: [id number]  HPI VAGINAL DISCHARGE Onset: recurrent Description: vaginal discharge, vaginal odor Modifying factors: non  Symptoms Odor: yes Itching: no Vaginal burning: no Dysuria: no Bleeding: no Pelvic pain:no  Back pain: no Fever: no Genital sores: no Rash: no Dyspareunia: no GI Symptoms: no  Red Flags:  Missed period: no Recent antibiotics: no Possible STD exposure: hx/o STDs in the past IUD: no Diabetes: no    Review of Systems See HPI, otherwise ROS negative.     Objective:   Physical Exam Gen: up in chair, NAD HEENT: NCAT, EOMI, TMs clear bilaterally CV: RRR, no murmurs auscultated PULM: CTAB, no wheezes, rales, rhoncii ABD: S/NT/+ bowel sounds  GU: normal external genitalia, + vaginal discharge, + CMT EXT: 2+ peripheral pulses         Assessment & [name]:

## 2011-07-20 DIAGNOSIS — N760 Acute vaginitis: Secondary | ICD-10-CM | POA: Insufficient documentation

## 2011-07-20 DIAGNOSIS — B9689 Other specified bacterial agents as the cause of diseases classified elsewhere: Secondary | ICD-10-CM | POA: Insufficient documentation

## 2011-07-20 NOTE — Assessment & Plan Note (Signed)
Recurrence of this today. Rocephin and doxy. Handout given.

## 2011-07-20 NOTE — Assessment & Plan Note (Signed)
Recurrent issue. WIll place on daily metrogel. Follow up with PCP in 4-6 weeks.

## 2011-08-25 ENCOUNTER — Other Ambulatory Visit: Payer: Medicaid Other

## 2011-08-25 DIAGNOSIS — D649 Anemia, unspecified: Secondary | ICD-10-CM

## 2011-08-25 DIAGNOSIS — E049 Nontoxic goiter, unspecified: Secondary | ICD-10-CM

## 2011-08-25 DIAGNOSIS — E669 Obesity, unspecified: Secondary | ICD-10-CM

## 2011-08-25 LAB — LIPID PANEL
Cholesterol: 210 mg/dL — ABNORMAL HIGH (ref 0–200)
HDL: 61 mg/dL (ref >39)
LDL Cholesterol: 129 mg/dL — ABNORMAL HIGH (ref 0–99)
Triglycerides: 99 mg/dL (ref <150)

## 2011-08-25 LAB — COMPREHENSIVE METABOLIC PANEL
Albumin: 4.7 g/dL (ref 3.5–5.2)
Alkaline Phosphatase: 55 U/L (ref 39–117)
BUN: 13 mg/dL (ref 6–23)
CO2: 24 mEq/L (ref 19–32)
Calcium: 9.5 mg/dL (ref 8.4–10.5)
Glucose, Bld: 93 mg/dL (ref 70–99)
Potassium: 4 mEq/L (ref 3.5–5.3)

## 2011-08-25 LAB — CBC
HCT: 36.1 % (ref 36.0–46.0)
Hemoglobin: 12.2 g/dL (ref 12.0–15.0)
MCHC: 33.8 g/dL (ref 30.0–36.0)
MCV: 82.4 fL (ref 78.0–100.0)
RDW: 13.8 % (ref 11.5–15.5)
WBC: 12.7 10*3/uL — ABNORMAL HIGH (ref 4.0–10.5)

## 2011-08-25 NOTE — Progress Notes (Signed)
CMP,FLP,TSH AND CBC DONE TODAY Erika [name] 

## 2011-08-29 ENCOUNTER — Encounter: Payer: Self-pay | Admitting: Family Medicine

## 2011-08-29 ENCOUNTER — Ambulatory Visit (INDEPENDENT_AMBULATORY_CARE_PROVIDER_SITE_OTHER): Payer: Medicaid Other | Admitting: Family Medicine

## 2011-08-29 VITALS — BP 124/78 | HR 89 | Ht 64.0 in | Wt 203.0 lb

## 2011-08-29 DIAGNOSIS — E669 Obesity, unspecified: Secondary | ICD-10-CM

## 2011-08-29 DIAGNOSIS — E7800 Pure hypercholesterolemia, unspecified: Secondary | ICD-10-CM

## 2011-08-29 DIAGNOSIS — E049 Nontoxic goiter, unspecified: Secondary | ICD-10-CM

## 2011-08-29 DIAGNOSIS — N871 Moderate cervical dysplasia: Secondary | ICD-10-CM

## 2011-08-29 NOTE — Progress Notes (Signed)
Subjective:    Patient ID: Erika [name], female    DOB: 1980/11/30, 31 y.o.   MRN: [id number]  HPI Pt that comes for annual exam visit. She recently had a URI and visit ED on Sunday where Amoxacillin/10days was prescribed for sinusitis. She reports her symptoms have improved.  She also came to discuss recent labs results: LDL is elevated to 129. Denies chest pain, palpitations, SOB.  Pt is concerned about her pap smear regimen since she was tested with frequency after the LEEP/2007. No vaginal discharge at this time, no dyspareunia. Pt has tubal ligation for contraception.   Review of Systems  Constitutional: Negative for activity change, appetite change and unexpected weight change.  HENT: Positive for sinus pressure.   Respiratory: Negative.   Cardiovascular: Negative.   Genitourinary: Negative.   Musculoskeletal: Positive for myalgias and back pain.  Psychiatric/Behavioral: Negative for suicidal ideas, behavioral problems and agitation.       Objective:   Physical Exam  Nursing note and vitals reviewed. Constitutional: She is oriented to person, place, and time. No distress.  HENT:  Mouth/Throat: Oropharynx is clear and moist.  Eyes: Pupils are equal, round, and reactive to light.  Neck: Normal range of motion. Neck supple.  Cardiovascular: Normal rate, regular rhythm and normal heart sounds.   Pulmonary/Chest: Effort normal.  Abdominal: Soft. Bowel sounds are normal. There is no tenderness. There is no rebound and no guarding.  Musculoskeletal: Normal range of motion. She exhibits no edema and no tenderness.  Neurological: She is alert and oriented to person, place, and time.  Psychiatric: She has a normal mood and affect. Her behavior is normal. Thought content normal.          Assessment & [name]:

## 2011-08-30 DIAGNOSIS — E7800 Pure hypercholesterolemia, unspecified: Secondary | ICD-10-CM | POA: Insufficient documentation

## 2011-08-30 NOTE — Assessment & Plan Note (Signed)
Prior testing in 2009 was WNL. Pt admits not healthy eating habits. Plan: Mentioned on Obesity assessment. Will consider statins if life style changes do not take place or are insufficient to control LDL.

## 2011-08-30 NOTE — Assessment & Plan Note (Addendum)
Pt motivated to loose weight. Pt eating patterns are disorganized. Has breakfast and dinner, skipping meals/snacks. Drink non-diet sodas mostly.  Plan: About 20 min discussion about nutrition.  Plan to eat every 3 h (no more than 5 h between meals) and make healthy choices. Recommended DASH diet. Incorporate regular exercise. Next appointment in 3 months.

## 2011-08-30 NOTE — Assessment & Plan Note (Signed)
TSH within normal limits

## 2011-08-30 NOTE — Patient Instructions (Addendum)

## 2011-09-05 ENCOUNTER — Emergency Department (HOSPITAL_BASED_OUTPATIENT_CLINIC_OR_DEPARTMENT_OTHER)
Admission: EM | Admit: 2011-09-05 | Discharge: 2011-09-05 | Disposition: A | Discharge status: Home / Self Care | Payer: Medicaid Other | Attending: Emergency Medicine | Admitting: Emergency Medicine

## 2011-09-05 ENCOUNTER — Encounter (HOSPITAL_BASED_OUTPATIENT_CLINIC_OR_DEPARTMENT_OTHER): Payer: Self-pay | Admitting: *Deleted

## 2011-09-05 DIAGNOSIS — L255 Unspecified contact dermatitis due to plants, except food: Secondary | ICD-10-CM | POA: Insufficient documentation

## 2011-09-05 DIAGNOSIS — Z87891 Personal history of nicotine dependence: Secondary | ICD-10-CM | POA: Insufficient documentation

## 2011-09-05 DIAGNOSIS — L237 Allergic contact dermatitis due to plants, except food: Secondary | ICD-10-CM

## 2011-09-05 MED ORDER — FAMOTIDINE 20 MG PO TABS
20.0000 mg | ORAL_TABLET | Freq: Once | ORAL | Status: AC
  Administered 2011-09-05: 20 mg via ORAL
  Filled 2011-09-05: qty 1

## 2011-09-05 MED ORDER — PREDNISONE 20 MG PO TABS: ORAL_TABLET | ORAL | Status: DC

## 2011-09-05 MED ORDER — FAMOTIDINE 20 MG PO TABS: 20.0000 mg | ORAL_TABLET | Freq: Two times a day (BID) | ORAL | Status: DC

## 2011-09-05 MED ORDER — TRIAMCINOLONE ACETONIDE 0.1 % EX CREA: TOPICAL_CREAM | Freq: Two times a day (BID) | CUTANEOUS | Status: DC

## 2011-09-05 MED ORDER — PREDNISONE 50 MG PO TABS
60.0000 mg | ORAL_TABLET | Freq: Once | ORAL | Status: AC
  Administered 2011-09-05: 60 mg via ORAL
  Filled 2011-09-05: qty 1

## 2011-09-05 MED ORDER — HYDROXYZINE HCL 25 MG PO TABS: 25.0000 mg | ORAL_TABLET | Freq: Three times a day (TID) | ORAL | Status: AC | PRN

## 2011-09-05 MED ORDER — HYDROXYZINE HCL 25 MG PO TABS
25.0000 mg | ORAL_TABLET | Freq: Once | ORAL | Status: AC
  Administered 2011-09-05: 25 mg via ORAL
  Filled 2011-09-05: qty 1

## 2011-09-05 NOTE — ED Notes (Signed)
Pt amb to triage with quick steady gait in nad. Pt reports rash x Monday to arms, face, neck, inner thighs and stomach. States itching and painful.

## 2011-09-05 NOTE — ED Notes (Signed)
Pt states she [facility] for ear infection, finished amox on Tuesday, was seen [facility] yesterday for rash, pt states she was given benadryl and "steroid" injection, also rx for atbx but she has not filled it yet.

## 2011-09-05 NOTE — ED Provider Notes (Signed)
History     CSN: [id number]  Arrival date & time 09/05/11  1640   First MD Initiated Contact with Patient 09/05/11 1646      Chief Complaint  Patient presents with  . Rash    (Consider location/radiation/quality/duration/timing/severity/associated sxs/prior treatment) HPI Patient is a 31 year old female who presents today complaining of increasing irritation from rash since this began 5 days ago. Patient notes that she had been receiving treatment for bilateral otitis media with prednisone and amoxicillin when her rash began. Patient was out working in the yard pulling weeds. She notes that she had made sure to wear gloves and long sleeves given that she had had a recent bout of poison ivy working in the same area. That day she was bitten by an insect and went to the [facility] regional ER. At that time the patient was treated with a steroid shot as well as Benadryl and Pepcid. She has been taking Benadryl at home for her symptoms and has also tried Caladryl lotion with minimal relief. She currently has scattered erythematous rash with some vesicular components noted over the bilateral upper extremities. Patient feels that she is continued to get worse. She also reports some chest pain that is consistent with her prior episodes of reflux. Patient denies any fevers, nausea, or vomiting. Patient notes that the area where she had a prior insect bite has completely resolved. Patient reports that her pain as an 8/10 and worse than when she had prior poison ivy. She denies any chest pain or shortness of breath at this time. Patient has been afebrile. There are no other associated or modifying factors. Past Medical History  Diagnosis Date  . Migraine   . Back pain   . Arthritis     Past Surgical History  Procedure Date  . Cesarean section   . Tubal ligation   . 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 Tonsillectomy   . Adenoidectomy   . Nasal sinus surgery     Family  History  Problem Relation Age of Onset  . Cancer Maternal Grandmother     breast ca, cervical  . Cancer Paternal Grandmother     breast ca  . Alcohol abuse Father     History  Substance Use Topics  . Smoking status: Former Smoker -- 0.5 packs/day  . Smokeless tobacco: Former Neurosurgeon    Quit date: 05/01/2011  . Alcohol Use: Yes    OB History    Grav Para Term Preterm Abortions TAB SAB Ect Mult Living                  Review of Systems  Constitutional: Negative.   HENT: Negative.   Eyes: Negative.   Respiratory: Negative.   Cardiovascular: Positive for chest pain.  Gastrointestinal: Negative.   Genitourinary: Negative.   Musculoskeletal: Negative.   Skin: Positive for rash.  Neurological: Negative.   Hematological: Negative.   Psychiatric/Behavioral: Negative.   All other systems reviewed and are negative.    Allergies  Review of patient's allergies indicates no known allergies.  Home Medications   Current Outpatient Rx  Name Route Sig Dispense Refill  . ACETAMINOPHEN 500 MG PO TABS Oral Take 2,000 mg by mouth as needed. For pain     . CYCLOBENZAPRINE HCL 10 MG PO TABS Oral Take 1 tablet (10 mg total) by mouth every 8 (eight) hours as needed for muscle spasms. 30 tablet 1  . FAMOTIDINE 20 MG  PO TABS Oral Take 1 tablet (20 mg total) by mouth 2 (two) times daily. 60 tablet 0  . HYDROXYZINE HCL 25 MG PO TABS Oral Take 1 tablet (25 mg total) by mouth every 8 (eight) hours as needed for itching. 30 tablet 0  . METRONIDAZOLE 0.75 % VA GEL Vaginal Place 1 Applicatorful vaginally daily. 70 g 6  . PREDNISONE 20 MG PO TABS  Take 3 tabs by mouth daily x7 days. Then take 2 tabs by mouth daily x7 days. Then take 1 tab by mouth daily x7 days. Then stop 42 tablet 0  . SUMATRIPTAN SUCCINATE 50 MG PO TABS  1 pill at start of migraine symptoms. If no improvement 2 pills 2 hours later. 10 tablet 3  . TOPIRAMATE 25 MG PO TABS Oral Take 1 tablet (25 mg total) by mouth at bedtime. 30  tablet 1  . TRIAMCINOLONE ACETONIDE 0.1 % EX CREA Topical Apply topically 2 (two) times daily. 30 g 0    BP 121/76  Pulse 96  Temp 97.9 F (36.6 C) (Oral)  Resp 18  Ht 5\' 4"  (1.626 m)  Wt 200 lb (90.719 kg)  BMI 34.33 kg/m2  SpO2 100%  LMP 08/22/2011  Physical Exam  Nursing note and vitals reviewed. GEN: Well-developed, well-nourished female in no distress HEENT: Atraumatic, normocephalic. Oropharynx clear without erythema EYES: PERRLA BL, no scleral icterus. NECK: Trachea midline, no meningismus CV: regular rate and rhythm. No murmurs, rubs, or gallops PULM: No respiratory distress.  No crackles, wheezes, or rales. GI: soft, non-tender. No guarding, rebound, or tenderness. + bowel sounds  GU: deferred Neuro: cranial nerves grossly 2-12 intact, no abnormalities of strength or sensation, A and O x 3 MSK: Patient moves all 4 extremities symmetrically, no deformity, edema, or injury noted Skin: Patient with areas of erythema and occasional vesicles noted in past consistent with excoriation and scattered contact dermatitis over the bilateral upper extremities. Patient also has one lesion on her right side. She has affected areas in her neck and also one across her right cheek. These are consistent with a dermatitis from poison ivy exposure. Psych: no abnormality of mood   ED Course  Procedures (including critical care time)  Labs Reviewed - No data to display No results found.   1. Contact dermatitis due to poison ivy       MDM  Patient was evaluated by myself. Based on appearance of the patient's rash as well as her history of exposures I think this is likely a rash due to exposure to poison ivy. Lesions were consistent with this. I do think patient may have had rebound because of her steroid shot she received on Monday right when this began. This can occur when lung and obscured course has not been given. Patient was given a 21 day course today. The chest pain she described  was not present at the time of presentation. This also sounded like she had some aggravation of her reflux symptoms likely secondary to recent treatment with steroids. She was given a dose of Pepcid and written for a 30 day prescription for this. I explained the need to take this as long as she was taking her prednisone to prevent her having this kind of irritation again. Patient was given a dose of hydroxyzine here to see if she hadn't improved relief compared to Benadryl. She also was given a topical steroid cream to use twice a day. Patient did not have any areas that concerned me for cellulitis. That  she's been on recent antibiotics I did not think in any way that this was a drug reaction. The slight vesicular component was not suggestive of herpes zoster as patient's rash was not in a dermatomal distribution in her history was inconsistent with this. Patient was told that she was not improving by Monday she should make a followup appointment with her primary care provider. Patient was discharged in good condition.        Cyndra Numbers, MD 09/05/11 [phone]

## 2011-11-20 ENCOUNTER — Ambulatory Visit (INDEPENDENT_AMBULATORY_CARE_PROVIDER_SITE_OTHER): Payer: Medicaid Other | Admitting: Family Medicine

## 2011-11-20 ENCOUNTER — Encounter: Payer: Self-pay | Admitting: Family Medicine

## 2011-11-20 VITALS — BP 100/69 | HR 91 | Temp 97.9°F | Ht 64.0 in | Wt 212.0 lb

## 2011-11-20 DIAGNOSIS — N390 Urinary tract infection, site not specified: Secondary | ICD-10-CM

## 2011-11-20 DIAGNOSIS — R300 Dysuria: Secondary | ICD-10-CM

## 2011-11-20 DIAGNOSIS — Z23 Encounter for immunization: Secondary | ICD-10-CM

## 2011-11-20 LAB — POCT URINALYSIS DIPSTICK
Bilirubin, UA: NEGATIVE
Glucose, UA: NEGATIVE
Ketones, UA: NEGATIVE
Spec Grav, UA: 1.025
pH, UA: 5.5

## 2011-11-20 LAB — POCT UA - MICROSCOPIC ONLY: Epithelial cells, urine per micros: >20

## 2011-11-20 MED ORDER — FLUCONAZOLE 150 MG PO TABS: 150.0000 mg | ORAL_TABLET | Freq: Once | ORAL | Status: DC

## 2011-11-20 MED ORDER — SULFAMETHOXAZOLE-TRIMETHOPRIM 800-160 MG PO TABS: 1.0000 | ORAL_TABLET | Freq: Two times a day (BID) | ORAL | Status: DC

## 2011-11-20 NOTE — Patient Instructions (Signed)

## 2011-11-20 NOTE — Progress Notes (Signed)
Subjective:    Patient ID: Erika [name], female    DOB: 1980-10-15, 31 y.o.   MRN: [id number]  HPI Comes in with low abdominal pain, back pain, urinary frequency dysuria.  H/o of frequent UTI's and this is typical.  Frequently gets yeast infection after treatment.  Denies fever, chills, abdominal pain.   Review of Systems  Constitutional: Negative for fever and chills.  Gastrointestinal: Positive for nausea. Negative for vomiting and abdominal pain.  Genitourinary: Positive for dysuria. Negative for hematuria, vaginal discharge and vaginal pain.       Objective:   Physical Exam  Vitals reviewed. Constitutional: She appears well-developed and well-nourished.  HENT:  Head: Normocephalic.  Eyes: No scleral icterus.  Neck: Neck supple.  Cardiovascular: Normal rate.   Pulmonary/Chest: Effort normal.  Abdominal: There is no tenderness.  Musculoskeletal:       No CVA tenderness          Assessment & [name]:   1. Urinary tract infection  sulfamethoxazole-trimethoprim (BACTRIM DS,SEPTRA DS) 800-160 MG per tablet, fluconazole (DIFLUCAN) 150 MG tablet  2. Dysuria  POCT Urinalysis Dipstick, POCT UA - Microscopic Only  Prophylactic yeast treatment. Flu shot

## 2011-12-16 ENCOUNTER — Encounter: Payer: Self-pay | Admitting: *Deleted

## 2012-01-19 ENCOUNTER — Ambulatory Visit (INDEPENDENT_AMBULATORY_CARE_PROVIDER_SITE_OTHER): Payer: Medicaid Other | Admitting: Family Medicine

## 2012-01-19 ENCOUNTER — Encounter: Payer: Self-pay | Admitting: Family Medicine

## 2012-01-19 VITALS — BP 110/62 | HR 72 | Temp 98.6°F | Ht 64.0 in | Wt 216.0 lb

## 2012-01-19 DIAGNOSIS — N390 Urinary tract infection, site not specified: Secondary | ICD-10-CM

## 2012-01-19 DIAGNOSIS — R300 Dysuria: Secondary | ICD-10-CM

## 2012-01-19 LAB — POCT UA - MICROSCOPIC ONLY

## 2012-01-19 LAB — POCT URINALYSIS DIPSTICK: Blood, UA: NEGATIVE

## 2012-01-19 MED ORDER — SULFAMETHOXAZOLE-TRIMETHOPRIM 800-160 MG PO TABS: 1.0000 | ORAL_TABLET | Freq: Two times a day (BID) | ORAL | Status: DC

## 2012-01-19 NOTE — Patient Instructions (Addendum)
Urinary Tract Infection  A urinary tract infection (UTI) is often caused by a germ (bacteria). A UTI is usually helped with medicine (antibiotics) that kills germs. Take all the medicine until it is gone. Do this even if you are feeling better. You are usually better in 7 to 10 days.  HOME CARE    Drink enough water and fluids to keep your pee (urine) clear or pale yellow. Drink:   Cranberry juice.   Water.   Avoid:   Caffeine.   Tea.   Bubbly (carbonated) drinks.   Alcohol.   Only take medicine as told by your doctor.   To prevent further infections:   Pee often.   After pooping (bowel movement), women should wipe from front to back. Use each tissue only once.   Pee before and after having sex (intercourse).  Ask your doctor when your test results will be ready. Make sure you follow up and get your test results.   GET HELP RIGHT AWAY IF:    There is very bad back pain or lower belly (abdominal) pain.   You get the chills.   You have a fever.   Your baby is older than 3 months with a rectal temperature of 102 F (38.9 C) or higher.   Your baby is 3 months old or younger with a rectal temperature of 100.4 F (38 C) or higher.   You feel sick to your stomach (nauseous) or throw up (vomit).   There is continued burning with peeing.   Your problems are not better in 3 days. Return sooner if you are getting worse.  MAKE SURE YOU:    Understand these instructions.   Will watch your condition.   Will get help right away if you are not doing well or get worse.  Document Released: 08/06/2007 Document Revised: 05/12/2011 Document Reviewed: 08/06/2007  [employer] Patient Information 2013 [employer].

## 2012-01-19 NOTE — Progress Notes (Signed)
Patient ID: MEREL LAFORTE, female   DOB: 19-May-1980, 31 y.o.   MRN: [id number] SUBJECTIVE: CONNI BROUILLARD is a 31 y.o. female who complains of urinary frequency, urgency and dysuria x 3 days, without flank pain, fever, chills, or abnormal vaginal discharge or bleeding.   OBJECTIVE: Appears well, in no apparent distress.  Vital signs are normal. The abdomen is soft without tenderness, guarding, mass, rebound or organomegaly. No CVA tenderness or inguinal adenopathy noted.   ASSESSMENT: UTI uncomplicated without evidence of pyelonephritis  PLAN: Treatment per orders - also push fluids, may use Pyridium OTC prn. Call or return to clinic prn if these symptoms worsen or fail to improve as anticipated.  Will order culture as this is second in several months.  If continues to have frequent UTI's would consider PRN refill of abx.

## 2012-01-19 NOTE — Addendum Note (Signed)
Addended by: [place], Jkayla Spiewak on: 01/19/2012 09:38 AM   Modules accepted: Orders

## 2012-03-15 ENCOUNTER — Ambulatory Visit (INDEPENDENT_AMBULATORY_CARE_PROVIDER_SITE_OTHER): Payer: Medicaid Other | Admitting: Family Medicine

## 2012-03-15 ENCOUNTER — Encounter: Payer: Self-pay | Admitting: Family Medicine

## 2012-03-15 VITALS — BP 107/32 | HR 76 | Temp 98.0°F | Ht 64.0 in | Wt 219.0 lb

## 2012-03-15 DIAGNOSIS — K529 Noninfective gastroenteritis and colitis, unspecified: Secondary | ICD-10-CM | POA: Insufficient documentation

## 2012-03-15 DIAGNOSIS — K5289 Other specified noninfective gastroenteritis and colitis: Secondary | ICD-10-CM

## 2012-03-15 MED ORDER — ACETAMINOPHEN 500 MG PO TABS: 500.0000 mg | ORAL_TABLET | Freq: Four times a day (QID) | ORAL | Status: DC | PRN

## 2012-03-15 MED ORDER — ONDANSETRON HCL 8 MG PO TABS: 8.0000 mg | ORAL_TABLET | Freq: Three times a day (TID) | ORAL | Status: DC | PRN

## 2012-03-15 NOTE — Assessment & Plan Note (Signed)
Patient likely suffering from a GI viral illness.  Physical exam not concerning for acute abdomen.   - Plan to treat symptoms with Zofran, Tylenol, and Imodium  - Hydrate with Gatorade, water, etc and plenty of rest - Red flags reviewed, if symptoms do not resolve in 7-10 days, patient to return to clinic

## 2012-03-15 NOTE — Progress Notes (Signed)
Subjective:     Patient ID: Erika [name], female   DOB: 1980-10-11, 32 y.o.   MRN: [id number]  HPI  Ms. Brunelle is a 32 year old female with a 3 day history of lower abdominal pain, diarrhea, and nausea without vomiting. She has had about 3 non-bloody loose bowel movements per day. She has tried Pepto-Bismol and Phenergan without relief. Sick contacts include a teen aged daughter who had a 24 hour episode of vomiting and diarrhea last week. She denies fevers, chills, vomiting and vaginal discharge.   Review of Systems  Constitutional: Positive for appetite change and fatigue. Negative for chills.  HENT: Negative.   Respiratory: Negative.   Gastrointestinal: Positive for nausea, abdominal pain and diarrhea. Negative for vomiting and blood in stool.  Genitourinary: Negative for dysuria, vaginal discharge and difficulty urinating.       Objective:   Physical Exam  Cardiovascular: Normal rate, regular rhythm and normal heart sounds.   Abdominal: Soft. Normal appearance and bowel sounds are normal. She exhibits no distension. There is tenderness in the right lower quadrant, suprapubic area and left lower quadrant. There is no rigidity, no rebound and no guarding.  Skin: Skin is warm and intact. She is not diaphoretic.       Assessment:     32 year old female with lower quadrant abdominal pain, diarrhea and nausea for the past 3 days    [name]:      PGY-3 ADDENDUM: Patient seen, examined independently. Available data reviewed. Agree with findings, assessment, and [name] as outlined by MS-IV. My additional findings are documented and highlighted above.  HPI:  Patient presents with 3 day history of nausea and diarrhea.  Denies any bloody stools or vomiting.  She endorses subjective fevers at home yesterday.  Patient has tried over the counter Pepto Bismol and Phenergan (for hx of migraines) with little symptom relief.  She has been around sick contacts at home.  She has a decreased appetite, but  still drinking fluids without difficulty.   Physical Exam  Filed Vitals:   03/15/12 1030  BP: 107/60 rechecked  Pulse: 76  Temp: 98 F (36.7 C)  General: appears fatigued, but in NAD Cardiovascular: Normal rate, regular rhythm and normal heart sounds.   Pulmonary: sounds congested, coughing, effort normal, CTAB Abdominal: Soft. Normal appearance and bowel sounds are normal. She exhibits no distension. There is tenderness in the right lower quadrant and left lower quadrant. There is no rigidity, no rebound and no guarding.  Skin: Skin is warm and intact. She is not diaphoretic.   A/P: See Problem List   Lajoyce Corners [place], [place] 03/15/2012 12:39 PM

## 2012-03-15 NOTE — Patient Instructions (Addendum)
It was nice to meet you today, Erika [name]. For nausea, try taking Zofran every 8 hours as needed.  Hold off on Phenergan for now. For diarrhea, you can purchase over the counter Imodium. For abdominal pain, take either Tylenol 500 mg (sent to your pharmacy) or over the counter Aleve as needed. Drink 8 servings of water or Gatorade or Ginger Ale per day to stay hydrated! Get plenty of rest. If symptoms worsen or you develop high fevers, chills, vomiting, please return to clinic or report to ER.  Viral Gastroenteritis Viral gastroenteritis is also known as stomach flu. This condition affects the stomach and intestinal tract. It can cause sudden diarrhea and vomiting. The illness typically lasts 3 to 8 days. Most people develop an immune response that eventually gets rid of the virus. While this natural response develops, the virus can make you quite ill. CAUSES  Many different viruses can cause gastroenteritis, such as rotavirus or noroviruses. You can catch one of these viruses by consuming contaminated food or water. You may also catch a virus by sharing utensils or other personal items with an infected person or by touching a contaminated surface. SYMPTOMS  The most common symptoms are diarrhea and vomiting. These problems can cause a severe loss of body fluids (dehydration) and a body salt (electrolyte) imbalance. Other symptoms may include:  Fever.  Headache.  Fatigue.  Abdominal pain. DIAGNOSIS  Your caregiver can usually diagnose viral gastroenteritis based on your symptoms and a physical exam. A stool sample may also be taken to test for the presence of viruses or other infections. TREATMENT  This illness typically goes away on its own. Treatments are aimed at rehydration. The most serious cases of viral gastroenteritis involve vomiting so severely that you are not able to keep fluids down. In these cases, fluids must be given through an intravenous line (IV). HOME CARE INSTRUCTIONS    Drink enough fluids to keep your urine clear or pale yellow. Drink small amounts of fluids frequently and increase the amounts as tolerated.  Ask your caregiver for specific rehydration instructions.  Avoid:  Foods high in sugar.  Alcohol.  Carbonated drinks.  Tobacco.  Juice.  Caffeine drinks.  Extremely hot or cold fluids.  Fatty, greasy foods.  Too much intake of anything at one time.  Dairy products until 24 to 48 hours after diarrhea stops.  You may consume probiotics. Probiotics are active cultures of beneficial bacteria. They may lessen the amount and number of diarrheal stools in adults. Probiotics can be found in yogurt with active cultures and in supplements.  Wash your hands well to avoid spreading the virus.  Only take over-the-counter or prescription medicines for pain, discomfort, or fever as directed by your caregiver. Do not give aspirin to children. Antidiarrheal medicines are not recommended.  Ask your caregiver if you should continue to take your regular prescribed and over-the-counter medicines.  Keep all follow-up appointments as directed by your caregiver. SEEK IMMEDIATE MEDICAL CARE IF:   You are unable to keep fluids down.  You do not urinate at least once every 6 to 8 hours.  You develop shortness of breath.  You notice blood in your stool or vomit. This may look like coffee grounds.  You have abdominal pain that increases or is concentrated in one small area (localized).  You have persistent vomiting or diarrhea.  You have a fever.  The patient is a child younger than 3 months, and he or she has a fever.  The patient  is a child older than 3 months, and he or she has a fever and persistent symptoms.  The patient is a child older than 3 months, and he or she has a fever and symptoms suddenly get worse.  The patient is a baby, and he or she has no tears when crying. MAKE SURE YOU:   Understand these instructions.  Will watch  your condition.  Will get help right away if you are not doing well or get worse. Document Released: 02/17/2005 Document Revised: 05/12/2011 Document Reviewed: 12/04/2010 [facility] Patient Information 2013 [place], [place].

## 2012-03-24 ENCOUNTER — Encounter: Payer: Self-pay | Admitting: Family Medicine

## 2012-03-24 ENCOUNTER — Ambulatory Visit (INDEPENDENT_AMBULATORY_CARE_PROVIDER_SITE_OTHER): Payer: Medicaid Other | Admitting: Family Medicine

## 2012-03-24 VITALS — BP 141/75 | HR 89 | Temp 98.0°F | Ht 64.0 in | Wt 220.0 lb

## 2012-03-24 DIAGNOSIS — J069 Acute upper respiratory infection, unspecified: Secondary | ICD-10-CM

## 2012-03-24 MED ORDER — GUAIFENESIN ER 600 MG PO TB12: 1200.0000 mg | ORAL_TABLET | Freq: Two times a day (BID) | ORAL | Status: DC

## 2012-03-24 NOTE — Patient Instructions (Addendum)
You have a virus as you were told in the ER yesterday.  The main thing is to try to get rested up and well hydrated.  I want you to take today and tomorrow off of work and not go back until at least back to 75% of your normal self.  Take tylenol 500mg  every 6 hours to help relieve pain in throat. Also try chloraseptic spray. Finally get some throat lozenges.  Make sure your oxycodone does not have tylenol/acetaminophen in it. If it does, take ibuprofen 400 mg every 8 hours instead of tylenol.  Also you can take the Mucinex I sent in to help you cough up the phlegm.  Make sure to stay well hydrated as you were already set back last week by your stomach bug.  This illness will likely last at least a week and perhaps 2 weeks. Please come back if your not starting to improve by 7-10 days.

## 2012-03-24 NOTE — Progress Notes (Signed)
Subjective:     Erika [name] is a 32 y.o. female who presents for evaluation of symptoms of a URI. Symptoms include bilateral ear pressure/pain, congestion, coryza, cough described as productive of white and thin sputum, lightheadedness, no  fever, post nasal drip and sore throat. Onset of symptoms was 1 day ago, and has been stable since that time. Treatment to date: none at home with exception of tylenol x1. Did go to ER yesterday morning at [facility] and was told had URI and was given Ibuprofen x1. Denies nausea/vomiting/fever/chills. Endorses fatigue.   Past medical History-bipolar, migraines (treated at [facility])  Review of Systems Pertinent items are noted in HPI.   Objective:    BP 141/75  Pulse 89  Temp 98 F (36.7 C) (Oral)  Ht 5\' 4"  (1.626 m)  Wt 220 lb (99.791 kg)  BMI 37.76 kg/m2  LMP 02/23/2012 General appearance: alert, cooperative and no distress Head: atraumatic, sinuses nontender to percussion Eyes: conjunctivae/corneas clear. PERRL, EOM's intact.  Ears: normal TM's and external ear canals both ears Nose: clear discharge, moderate congestion, turbinates red, swollen Throat: abnormal findings: mild oropharyngeal erythema Lungs: clear to auscultation bilaterally Heart: regular rate and rhythm, S1, S2 normal, no murmur, click, rub or gallop Extremities: extremities normal, atraumatic, no cyanosis or edema Skin: Skin color, texture, turgor normal. No rashes or lesions   Assessment:    viral upper respiratory illness   Plan:    Discussed diagnosis and treatment of URI. Suggested symptomatic OTC remedies. Follow up as needed. See AVS

## 2012-04-07 ENCOUNTER — Telehealth: Payer: Self-pay | Admitting: Family Medicine

## 2012-04-07 ENCOUNTER — Encounter (HOSPITAL_BASED_OUTPATIENT_CLINIC_OR_DEPARTMENT_OTHER): Payer: Self-pay | Admitting: *Deleted

## 2012-04-07 ENCOUNTER — Emergency Department (HOSPITAL_BASED_OUTPATIENT_CLINIC_OR_DEPARTMENT_OTHER)
Admission: EM | Admit: 2012-04-07 | Discharge: 2012-04-07 | Disposition: A | Discharge status: Home / Self Care | Payer: Medicaid Other | Attending: Emergency Medicine | Admitting: Emergency Medicine

## 2012-04-07 DIAGNOSIS — M129 Arthropathy, unspecified: Secondary | ICD-10-CM | POA: Insufficient documentation

## 2012-04-07 DIAGNOSIS — Z79899 Other long term (current) drug therapy: Secondary | ICD-10-CM | POA: Insufficient documentation

## 2012-04-07 DIAGNOSIS — Z87891 Personal history of nicotine dependence: Secondary | ICD-10-CM | POA: Insufficient documentation

## 2012-04-07 DIAGNOSIS — IMO0002 Reserved for concepts with insufficient information to code with codable children: Secondary | ICD-10-CM | POA: Insufficient documentation

## 2012-04-07 DIAGNOSIS — L259 Unspecified contact dermatitis, unspecified cause: Secondary | ICD-10-CM

## 2012-04-07 DIAGNOSIS — G43909 Migraine, unspecified, not intractable, without status migrainosus: Secondary | ICD-10-CM | POA: Insufficient documentation

## 2012-04-07 MED ORDER — OXYCODONE-ACETAMINOPHEN 5-325 MG PO TABS: 2.0000 | ORAL_TABLET | ORAL | Status: DC | PRN

## 2012-04-07 MED ORDER — OXYCODONE-ACETAMINOPHEN 5-325 MG PO TABS
2.0000 | ORAL_TABLET | Freq: Once | ORAL | Status: AC
  Administered 2012-04-07: 2 via ORAL
  Filled 2012-04-07 (×2): qty 2

## 2012-04-07 NOTE — ED Provider Notes (Signed)
History     CSN: [id number]  Arrival date & time 04/07/12  1715   First MD Initiated Contact with Patient 04/07/12 1742      Chief Complaint  Patient presents with  . Burn    (Consider location/radiation/quality/duration/timing/severity/associated sxs/prior treatment) Patient is a 32 y.o. female presenting with burn. The history is provided by the patient.  Burn   patient complains of pain to her left breast after being exposed to chemicals at work. She did go home and take a shower and the pain feels like a burn. Localized to her areola of the left breast. No other involvement noted. Pain is constant and nothing makes it better or worse.  Past Medical History  Diagnosis Date  . Migraine   . Back pain   . Arthritis     Past Surgical History  Procedure Date  . Cesarean section   . Tubal ligation   . 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 Tonsillectomy   . Adenoidectomy   . Nasal sinus surgery     Family History  Problem Relation Age of Onset  . Cancer Maternal Grandmother     breast ca, cervical  . Cancer Paternal Grandmother     breast ca  . Alcohol abuse Father     History  Substance Use Topics  . Smoking status: Former Smoker -- 0.5 packs/day  . Smokeless tobacco: Former Neurosurgeon    Quit date: 05/01/2011  . Alcohol Use: Yes    OB History    Grav Para Term Preterm Abortions TAB SAB Ect Mult Living                  Review of Systems  All other systems reviewed and are negative.    Allergies  Review of patient's allergies indicates no known allergies.  Home Medications   Current Outpatient Rx  Name  Route  Sig  Dispense  Refill  . ACETAMINOPHEN 500 MG PO TABS   Oral   Take 1 tablet (500 mg total) by mouth every 6 (six) hours as needed for pain.   30 tablet   0   . CYCLOBENZAPRINE HCL 10 MG PO TABS   Oral   Take 10 mg by mouth 3 (three) times daily as needed.         Marland Kitchen FAMOTIDINE 20 MG PO TABS   Oral   Take 1  tablet (20 mg total) by mouth 2 (two) times daily.   60 tablet   0   . GUAIFENESIN ER 600 MG PO TB12   Oral   Take 2 tablets (1,200 mg total) by mouth 2 (two) times daily.   30 tablet   0   . OXYCODONE HCL 5 MG PO CAPS   Oral   Take 5 mg by mouth every 4 (four) hours as needed. Prescribed by Neurologist in [facility] for migraines.         . SUMATRIPTAN SUCCINATE 50 MG PO TABS      1 pill at start of migraine symptoms. If no improvement 2 pills 2 hours later.   10 tablet   3   . TOPIRAMATE 25 MG PO TABS   Oral   Take 1 tablet (25 mg total) by mouth at bedtime.   30 tablet   1   . TRIAMCINOLONE ACETONIDE 0.1 % EX CREA   Topical   Apply topically 2 (two) times daily.   30 g  0     BP 115/67  Pulse 75  Temp 98.2 F (36.8 C) (Oral)  Resp 16  Ht 5\' 4"  (1.626 m)  Wt 219 lb (99.338 kg)  BMI 37.59 kg/m2  SpO2 100%  LMP 04/05/2012  Physical Exam  Nursing note and vitals reviewed. Constitutional: She is oriented to person, place, and time. She appears well-developed and well-nourished.  Non-toxic appearance.  HENT:  Head: Normocephalic and atraumatic.  Eyes: Conjunctivae normal are normal. Pupils are equal, round, and reactive to light.  Neck: Normal range of motion.  Cardiovascular: Normal rate.   Pulmonary/Chest: Effort normal.    Neurological: She is alert and oriented to person, place, and time.  Skin: Skin is warm and dry.  Psychiatric: She has a normal mood and affect.    ED Course  Procedures (including critical care time)  Labs Reviewed - No data to display No results found.   No diagnosis found.    MDM  Patient with contact dermatitis of her skin. She will be prescribed pain medication and was instructed to use topical antibiotic cream        Toy Baker, MD 04/07/12 1754
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
## 2012-05-18 ENCOUNTER — Encounter: Payer: Self-pay | Admitting: Family Medicine

## 2012-05-18 ENCOUNTER — Ambulatory Visit (INDEPENDENT_AMBULATORY_CARE_PROVIDER_SITE_OTHER): Payer: Medicaid Other | Admitting: Family Medicine

## 2012-05-18 VITALS — BP 115/72 | HR 84 | Temp 98.0°F | Ht 64.0 in | Wt 221.0 lb

## 2012-05-18 DIAGNOSIS — J019 Acute sinusitis, unspecified: Secondary | ICD-10-CM | POA: Insufficient documentation

## 2012-05-18 MED ORDER — DM-GUAIFENESIN ER 30-600 MG PO TB12: 1.0000 | ORAL_TABLET | Freq: Two times a day (BID) | ORAL | Status: DC | PRN

## 2012-05-18 MED ORDER — AMOXICILLIN-POT CLAVULANATE 875-125 MG PO TABS: 1.0000 | ORAL_TABLET | Freq: Two times a day (BID) | ORAL | Status: DC

## 2012-05-18 NOTE — Progress Notes (Signed)
Subjective:    Patient ID: Erika [name], female    DOB: [birth year], 32 y.o.   MRN: [id number]  HPI: Pt presents to clinic for same-day appointment for one day of sinus congestion and pain. Pt "woke up like this," and describes symptoms as a heavy fullness in her left sinuses with congestion and pain, thick greenish-yellow rhinorrhea, and tearing of left eye from pain/pressure. Pt also endorses sore throat and cough with resulting hoarseness. Cough exacerbates congestion/pain. Pt states she had a fever of "102 point something" this morning and took some Motrin after lunch (~3 hours ago). Denies sick contacts.  Pt reports sinus surgery about 1 and a half to 2 years ago for a deviated septum and to "clean out her sinus cavities." No problems since then other than occasional/"regular" cold-type symptoms and a sinus infection about 6 months ago.  Review of Systems: As above. Otherwise denies N/V, chest pain, SOB, change in bowel/bladder habits.     Objective:   Physical Exam BP 115/72  Pulse 84  Temp(Src) 98 F (36.7 C) (Oral)  Ht 5\' 4"  (1.626 m)  Wt 221 lb (100.245 kg)  BMI 37.92 kg/m2  LMP 05/03/2012 Gen: adult female, nontoxic but does appear very uncomfortable and has audible congestion HEENT: EOMI, MMM, conjunctivae clear, oropharynx mildly erythematous without exudate; TM's clear bilaterally  Significant maxillary tenderness to palpation; also tender in frontal sinuses but less so; worse on LEFT  Nasal turbinates boggy, inflamed, slightly worse on LEFT  No cervical lymphadenopathy but mild-moderate tenderness to palpation on RIGHT tonsilar area Cardio: RRR, no murmur appreciated Pulm: CTAB, no wheezes; normal WOB Ext: warm, well-perfused, no clubbing/cyanosis/edema; no LE tenderness or swelling     Assessment & [name]:

## 2012-05-18 NOTE — Assessment & Plan Note (Addendum)
A: Evidenced by symptoms and physical exam. Viral source is likely, but bacterial is also possible; symptoms only present for about 24h, but are moderate to severe and pt does report fever over 101 (though she is afebrile here, ~3 hours after taking Motrin).  P: Plan to treat with Augmentin for 7 days. Rx also provided for Mucinex DM. Motrin for fevers/pain. Pt instructed to return to clinic for any worsening of symptoms. Work note provided to pt for today and tomorrow.

## 2012-05-18 NOTE — Patient Instructions (Addendum)
Thank you for coming in, today! I'm sorry you're not feeling well. I am prescribing an antibiotic and a medication for cough and congestion.    Augmentin - this is your antibiotic. Take one pill today/this afternoon, then one pill twice per day until you finish all the pills.    Mucinex DM - this is guaifenesin (for congestion) and dextromethorphan (for cough). You can take one pill every twelve hours as needed. If you are not getting better in the next 3-5 days, make an appointment to come back. Please feel free to call with questions or concerns at any time. --Dr. Casper Harrison

## 2012-05-24 ENCOUNTER — Encounter: Payer: Self-pay | Admitting: Family Medicine

## 2012-05-24 ENCOUNTER — Ambulatory Visit (INDEPENDENT_AMBULATORY_CARE_PROVIDER_SITE_OTHER): Payer: Medicaid Other | Admitting: Family Medicine

## 2012-05-24 VITALS — BP 116/57 | HR 72 | Temp 98.0°F | Ht 64.0 in | Wt 222.0 lb

## 2012-05-24 DIAGNOSIS — J019 Acute sinusitis, unspecified: Secondary | ICD-10-CM

## 2012-05-24 MED ORDER — HYDROCODONE-CHLORPHENIRAMINE 5-4 MG/5ML PO SOLN: 5.0000 mL | Freq: Four times a day (QID) | ORAL | Status: DC | PRN

## 2012-05-24 NOTE — Assessment & Plan Note (Addendum)
Patient appears to be improving from standpoint of treatment of sinusitis with decreased congestion, no fever, and improving URI symptoms. Now appears to have post-infectious cough that is not responding to mucinex DM. Additionally does not appear to have lower respiratory tract manifestations with normal O2 sat and normal lung sounds. Plan: advised patient to finish course of augmentin. Prescribed hydrocodone-chlorpheniramine for cough. Ibuprofen for MSK pain. To return at the end of the week if symptoms not improved or worsened.

## 2012-05-24 NOTE — Progress Notes (Signed)
Subjective:    Patient ID: Erika [name], female    DOB: [birth year], 32 y.o.   MRN: [id number]  HPI Patient is a 32 yo female who presents as same day for f/u of acute sinusitis.  Seen last week by Dr. Casper Harrison for sinusitis and prescribed augmentin. Has one more day left. States congestion is better and now blowing out clear mucus, previously had been yellow-green. States now coughing more and feels as though the mucinex is not working. Has developed pain in bilateral ribs that is worse with cough. Denies fever since starting antibiotics.  Review of Systems see HPI     Objective:   Physical Exam  Constitutional: She appears well-developed and well-nourished.  HENT:  Head: Normocephalic and atraumatic.  Mouth/Throat: Oropharynx is clear and moist. No oropharyngeal exudate.  Bilateral TM normal, no lymphadenopathy  Eyes: Conjunctivae are normal.  Neck: Neck supple.  Cardiovascular: Normal rate, regular rhythm and normal heart sounds.  Exam reveals no gallop and no friction rub.   No murmur heard. Pulmonary/Chest: Effort normal and breath sounds normal. She has no wheezes. She has no rales. She exhibits tenderness (over costochondral joints and bilateral ribs at level or bra line).  Lymphadenopathy:    She has no cervical adenopathy.  Neurological: She is alert.  Skin: Skin is warm and dry.  BP 116/57  Pulse 72  Temp(Src) 98 F (36.7 C) (Oral)  Ht 5\' 4"  (1.626 m)  Wt 222 lb (100.699 kg)  BMI 38.09 kg/m2  SpO2 100%  LMP 05/03/2012    Assessment & [name]:

## 2012-05-24 NOTE — Patient Instructions (Signed)
Nice to meet you. Sorry to hear you are still feeling poorly. Please try the new cough medication I have prescribed. Finish the antibiotic Dr. Casper Harrison prescribed last time. If you develop fevers, difficulty breathing, or are not feeling better by the end of the week, please come back for a follow-up appointment.

## 2012-06-21 ENCOUNTER — Ambulatory Visit (INDEPENDENT_AMBULATORY_CARE_PROVIDER_SITE_OTHER): Payer: Medicaid Other | Admitting: Family Medicine

## 2012-06-21 ENCOUNTER — Encounter: Payer: Self-pay | Admitting: Family Medicine

## 2012-06-21 VITALS — BP 124/70 | Ht 64.0 in | Wt 226.0 lb

## 2012-06-21 DIAGNOSIS — J309 Allergic rhinitis, unspecified: Secondary | ICD-10-CM

## 2012-06-21 DIAGNOSIS — J302 Other seasonal allergic rhinitis: Secondary | ICD-10-CM

## 2012-06-21 MED ORDER — TRIAMCINOLONE ACETONIDE 0.1 % EX CREA: TOPICAL_CREAM | Freq: Two times a day (BID) | CUTANEOUS | Status: DC

## 2012-06-21 MED ORDER — FLUTICASONE PROPIONATE 50 MCG/ACT NA SUSP: 2.0000 | Freq: Every day | NASAL | Status: DC

## 2012-06-21 NOTE — Progress Notes (Signed)
Subjective:    Patient ID: Erika [name], female    DOB: 12-Jan-1981, 32 y.o.   MRN: [id number]  HPI  Erika [name] comes in with worsening of her seasonal allergies.  She reports specifically worse over past two days.  She is not taking any medications for allergies.  She reports watery, itchy eyes, sneezing, nasal congestion.  No fevers or chills, no facial pain.   Review of Systems See HPI    Objective:   Physical Exam  BP 124/70  Ht 5\' 4"  (1.626 m)  Wt 226 lb (102.513 kg)  BMI 38.77 kg/m2  LMP 06/01/2012 General appearance: alert, cooperative and no distress Eyes: Conjunctiva with mild pink injection Ears: normal TM's and external ear canals both ears Nose: moderate congestion, turbinates pale, swollen Throat: lips, mucosa, and tongue normal; teeth and gums normal Lungs: clear to auscultation bilaterally Heart: regular rate and rhythm, S1, S2 normal, no murmur, click, rub or gallop      Assessment & [name]:

## 2012-06-21 NOTE — Patient Instructions (Signed)
Allergic Rhinitis  Allergic rhinitis is when the mucous membranes in the nose respond to allergens. Allergens are particles in the air that cause your body to have an allergic reaction. This causes you to release allergic antibodies. Through a chain of events, these eventually cause you to release histamine into the blood stream (hence the use of antihistamines). Although meant to be protective to the body, it is this release that causes your discomfort, such as frequent sneezing, congestion and an itchy runny nose.    CAUSES    The pollen allergens may come from grasses, trees, and weeds. This is seasonal allergic rhinitis, or "hay fever." Other allergens cause year-round allergic rhinitis (perennial allergic rhinitis) such as house dust mite allergen, pet dander and mold spores.    SYMPTOMS     Nasal stuffiness (congestion).   Runny, itchy nose with sneezing and tearing of the eyes.   There is often an itching of the mouth, eyes and ears.  It cannot be cured, but it can be controlled with medications.  DIAGNOSIS    If you are unable to determine the offending allergen, skin or blood testing may find it.  TREATMENT     Avoid the allergen.   Medications and allergy shots (immunotherapy) can help.   Hay fever may often be treated with antihistamines in pill or nasal spray forms. Antihistamines block the effects of histamine. There are over-the-counter medicines that may help with nasal congestion and swelling around the eyes. Check with your caregiver before taking or giving this medicine.  If the treatment above does not work, there are many new medications your caregiver can prescribe. Stronger medications may be used if initial measures are ineffective. Desensitizing injections can be used if medications and avoidance fails. Desensitization is when a patient is given ongoing shots until the body becomes less sensitive to the allergen. Make sure you follow up with your caregiver if problems continue.   SEEK MEDICAL CARE IF:     You develop fever (more than 100.5 F (38.1 C).   You develop a cough that does not stop easily (persistent).   You have shortness of breath.   You start wheezing.   Symptoms interfere with normal daily activities.  Document Released: 11/12/2000 Document Revised: 05/12/2011 Document Reviewed: 05/24/2008  [employer] Patient Information 2013 [employer].

## 2012-06-21 NOTE — Assessment & Plan Note (Signed)
Rx for flonase, advised antihistamine (zyrtec, Claritin, etc). Discussed importance of taking both medications daily, reducing allergens in home, and symptomatic measures such as netti pot.  F/U in 1 month if not improved.

## 2013-02-27 ENCOUNTER — Encounter (HOSPITAL_BASED_OUTPATIENT_CLINIC_OR_DEPARTMENT_OTHER): Payer: Self-pay | Admitting: Emergency Medicine

## 2013-02-27 ENCOUNTER — Emergency Department (HOSPITAL_BASED_OUTPATIENT_CLINIC_OR_DEPARTMENT_OTHER)
Admission: EM | Admit: 2013-02-27 | Discharge: 2013-02-27 | Disposition: A | Discharge status: Home / Self Care | Payer: Medicaid Other | Attending: Emergency Medicine | Admitting: Emergency Medicine

## 2013-02-27 DIAGNOSIS — G43909 Migraine, unspecified, not intractable, without status migrainosus: Secondary | ICD-10-CM | POA: Insufficient documentation

## 2013-02-27 DIAGNOSIS — Z79899 Other long term (current) drug therapy: Secondary | ICD-10-CM | POA: Insufficient documentation

## 2013-02-27 DIAGNOSIS — IMO0002 Reserved for concepts with insufficient information to code with codable children: Secondary | ICD-10-CM | POA: Insufficient documentation

## 2013-02-27 DIAGNOSIS — Z87891 Personal history of nicotine dependence: Secondary | ICD-10-CM | POA: Insufficient documentation

## 2013-02-27 DIAGNOSIS — M129 Arthropathy, unspecified: Secondary | ICD-10-CM | POA: Insufficient documentation

## 2013-02-27 DIAGNOSIS — J111 Influenza due to unidentified influenza virus with other respiratory manifestations: Secondary | ICD-10-CM | POA: Insufficient documentation

## 2013-02-27 DIAGNOSIS — R197 Diarrhea, unspecified: Secondary | ICD-10-CM | POA: Insufficient documentation

## 2013-02-27 DIAGNOSIS — Z3202 Encounter for pregnancy test, result negative: Secondary | ICD-10-CM | POA: Insufficient documentation

## 2013-02-27 DIAGNOSIS — IMO0001 Reserved for inherently not codable concepts without codable children: Secondary | ICD-10-CM | POA: Insufficient documentation

## 2013-02-27 LAB — CBC WITH DIFFERENTIAL/PLATELET
Basophils Absolute: 0 10*3/uL (ref 0.0–0.1)
Basophils Relative: 0 % (ref 0–1)
Eosinophils Absolute: 0.2 10*3/uL (ref 0.0–0.7)
Hemoglobin: 11.3 g/dL — ABNORMAL LOW (ref 12.0–15.0)
MCH: 27.8 pg (ref 26.0–34.0)
MCHC: 32.8 g/dL (ref 30.0–36.0)
Monocytes Relative: 11 % (ref 3–12)
Neutro Abs: 2.1 10*3/uL (ref 1.7–7.7)
Neutrophils Relative %: 42 % — ABNORMAL LOW (ref 43–77)
RDW: 12.4 % (ref 11.5–15.5)

## 2013-02-27 LAB — COMPREHENSIVE METABOLIC PANEL
AST: 18 U/L (ref 0–37)
Albumin: 3.5 g/dL (ref 3.5–5.2)
Alkaline Phosphatase: 51 U/L (ref 39–117)
BUN: 11 mg/dL (ref 6–23)
Chloride: 107 mEq/L (ref 96–112)
Creatinine, Ser: 0.6 mg/dL (ref 0.50–1.10)
Potassium: 3.7 mEq/L (ref 3.5–5.1)
Total Protein: 6.7 g/dL (ref 6.0–8.3)

## 2013-02-27 LAB — PREGNANCY, URINE: Preg Test, Ur: NEGATIVE

## 2013-02-27 LAB — URINALYSIS, ROUTINE W REFLEX MICROSCOPIC
Glucose, UA: NEGATIVE mg/dL
Hgb urine dipstick: NEGATIVE
Leukocytes, UA: NEGATIVE
pH: 7.5 (ref 5.0–8.0)

## 2013-02-27 MED ORDER — ONDANSETRON HCL 4 MG/2ML IJ SOLN
4.0000 mg | Freq: Once | INTRAMUSCULAR | Status: AC
  Administered 2013-02-27: 4 mg via INTRAVENOUS
  Filled 2013-02-27: qty 2

## 2013-02-27 MED ORDER — SODIUM CHLORIDE 0.9 % IV BOLUS (SEPSIS)
1000.0000 mL | Freq: Once | INTRAVENOUS | Status: AC
  Administered 2013-02-27: 1000 mL via INTRAVENOUS

## 2013-02-27 NOTE — ED Notes (Signed)
Pt reports nausea, vomiting, abdominal pain since Friday.  Reports generalized body aches and chills

## 2013-02-27 NOTE — ED Provider Notes (Signed)
CSN: [id number]     Arrival date & time 02/27/13  0902 History   First MD Initiated Contact with Patient 02/27/13 0945     Chief Complaint  Patient presents with  . Emesis   (Consider location/radiation/quality/duration/timing/severity/associated sxs/prior Treatment) HPI 32 year old female who comes in today complaining of "not feeling well" for several days. She complains of subjective fever, chills, myalgias, nausea, vomiting, and diarrhea. She states she has been able to drink fluids but has not been keeping down solids. She denies increased urination or decreased urinary output. She states that her child has had similar symptoms. She states she has been taking Phenergan at home without relief. She has had some cough but it is nonproductive. She denies dyspnea. She denies abdominal pain. She has not had any abnormal rashes.  Past Medical History  Diagnosis Date  . Migraine   . Back pain   . Arthritis    Past Surgical History  Procedure Laterality Date  . Cesarean section    . Tubal ligation    . 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 Tonsillectomy    . Adenoidectomy    . Nasal sinus surgery     Family History  Problem Relation Age of Onset  . Cancer Maternal Grandmother     breast ca, cervical  . Cancer Paternal Grandmother     breast ca  . Alcohol abuse Father    History  Substance Use Topics  . Smoking status: Former Smoker -- 0.50 packs/day  . Smokeless tobacco: Former Neurosurgeon    Quit date: 05/01/2011  . Alcohol Use: Yes   OB History   Grav Para Term Preterm Abortions TAB SAB Ect Mult Living                 Review of Systems  All other systems reviewed and are negative.    Allergies  Review of patient's allergies indicates no known allergies.  Home Medications   Current Outpatient Rx  Name  Route  Sig  Dispense  Refill  . acetaminophen (TYLENOL) 500 MG tablet   Oral   Take 1 tablet (500 mg total) by mouth every 6 (six) hours as  needed for pain.   30 tablet   0   . amoxicillin-clavulanate (AUGMENTIN) 875-125 MG per tablet   Oral   Take 1 tablet by mouth 2 (two) times daily. Take for 7 days.   14 tablet   0   . cyclobenzaprine (FLEXERIL) 10 MG tablet   Oral   Take 10 mg by mouth 3 (three) times daily as needed.         Marland Kitchen dextromethorphan-guaiFENesin (MUCINEX DM) 30-600 MG per 12 hr tablet   Oral   Take 1 tablet by mouth every 12 (twelve) hours as needed (for cough or congestion).   14 tablet   0   . EXPIRED: famotidine (PEPCID) 20 MG tablet   Oral   Take 1 tablet (20 mg total) by mouth 2 (two) times daily.   60 tablet   0   . fluticasone (FLONASE) 50 MCG/ACT nasal spray   Nasal   Place 2 sprays into the nose daily.   16 g   6   . guaiFENesin (MUCINEX) 600 MG 12 hr tablet   Oral   Take 2 tablets (1,200 mg total) by mouth 2 (two) times daily.   30 tablet   0   . Hydrocodone-Chlorpheniramine 5-4 MG/5ML SOLN   Oral  Take 5 mLs by mouth every 6 (six) hours as needed (for cough).   480 mL   0   . oxycodone (OXY-IR) 5 MG capsule   Oral   Take 5 mg by mouth every 4 (four) hours as needed. Prescribed by Neurologist in [facility] for migraines.         Marland Kitchen oxyCODONE-acetaminophen (PERCOCET/ROXICET) 5-325 MG per tablet   Oral   Take 2 tablets by mouth every 4 (four) hours as needed for pain.   10 tablet   0   . SUMAtriptan (IMITREX) 50 MG tablet      1 pill at start of migraine symptoms. If no improvement 2 pills 2 hours later.   10 tablet   3   . EXPIRED: topiramate (TOPAMAX) 25 MG tablet   Oral   Take 1 tablet (25 mg total) by mouth at bedtime.   30 tablet   1   . triamcinolone cream (KENALOG) 0.1 %   Topical   Apply topically 2 (two) times daily.   30 g   0    BP 92/70  Pulse 72  Temp(Src) 97.9 F (36.6 C) (Oral)  Resp 18  Ht 5\' 4"  (1.626 m)  Wt 220 lb (99.791 kg)  BMI 37.74 kg/m2  SpO2 100% Physical Exam  Nursing note and vitals reviewed. Constitutional: She is  oriented to person, place, and time. She appears well-developed and well-nourished.  HENT:  Head: Normocephalic and atraumatic.  Right Ear: External ear normal.  Left Ear: External ear normal.  Nose: Nose normal.  Mouth/Throat: Oropharynx is clear and moist.  Eyes: Conjunctivae and EOM are normal. Pupils are equal, round, and reactive to light.  Neck: Normal range of motion. Neck supple.  Cardiovascular: Normal rate, regular rhythm, normal heart sounds and intact distal pulses.   Pulmonary/Chest: Effort normal and breath sounds normal.  Abdominal: Soft. Bowel sounds are normal.  Musculoskeletal: Normal range of motion.  Neurological: She is alert and oriented to person, place, and time. She has normal reflexes.  Skin: Skin is warm and dry.  Psychiatric: She has a normal mood and affect. Her behavior is normal. Thought content normal.    ED Course  Procedures (including critical care time) Labs Review Labs Reviewed  CBC WITH DIFFERENTIAL - Abnormal; Notable for the following:    Hemoglobin 11.3 (*)    HCT 34.5 (*)    Neutrophils Relative % 42 (*)    All other components within normal limits  COMPREHENSIVE METABOLIC PANEL  URINALYSIS, ROUTINE W REFLEX MICROSCOPIC  PREGNANCY, URINE   Imaging Review No results found.  EKG Interpretation   None       MDM  No diagnosis found. Patient has had Zofran and has taken fluids without vomiting here. She is given return precautions and advised to followup with her primary care physician.   Hilario Quarry, MD 02/27/13 [phone]

## 2013-03-10 ENCOUNTER — Ambulatory Visit (INDEPENDENT_AMBULATORY_CARE_PROVIDER_SITE_OTHER): Payer: Medicaid Other | Admitting: Family Medicine

## 2013-03-10 ENCOUNTER — Ambulatory Visit (HOSPITAL_COMMUNITY)
Admission: RE | Admit: 2013-03-10 | Discharge: 2013-03-10 | Disposition: A | Discharge status: Home / Self Care | Payer: Medicaid Other | Source: Ambulatory Visit | Attending: Family Medicine | Admitting: Family Medicine

## 2013-03-10 VITALS — BP 120/65 | HR 68 | Temp 99.0°F | Ht 64.0 in | Wt 223.0 lb

## 2013-03-10 DIAGNOSIS — R110 Nausea: Secondary | ICD-10-CM | POA: Insufficient documentation

## 2013-03-10 DIAGNOSIS — R109 Unspecified abdominal pain: Secondary | ICD-10-CM

## 2013-03-10 DIAGNOSIS — R935 Abnormal findings on diagnostic imaging of other abdominal regions, including retroperitoneum: Secondary | ICD-10-CM | POA: Insufficient documentation

## 2013-03-10 LAB — BASIC METABOLIC PANEL
BUN: 13 mg/dL (ref 6–23)
CHLORIDE: 101 meq/L (ref 96–112)
CO2: 27 meq/L (ref 19–32)
Calcium: 9.6 mg/dL (ref 8.4–10.5)
Creat: 0.77 mg/dL (ref 0.50–1.10)
Glucose, Bld: 79 mg/dL (ref 70–99)
Potassium: 3.8 mEq/L (ref 3.5–5.3)
SODIUM: 134 meq/L — AB (ref 135–145)

## 2013-03-10 LAB — CBC
HCT: 37.2 % (ref 36.0–46.0)
HEMOGLOBIN: 12.4 g/dL (ref 12.0–15.0)
MCH: 27.6 pg (ref 26.0–34.0)
MCHC: 33.3 g/dL (ref 30.0–36.0)
MCV: 82.7 fL (ref 78.0–100.0)
PLATELETS: 214 10*3/uL (ref 150–400)
RBC: 4.5 MIL/uL (ref 3.87–5.11)
RDW: 13.5 % (ref 11.5–15.5)
WBC: 7.7 10*3/uL (ref 4.0–10.5)

## 2013-03-10 LAB — LIPASE: Lipase: 17 U/L (ref 0–75)

## 2013-03-10 MED ORDER — ONDANSETRON 4 MG PO TBDP: 4.0000 mg | ORAL_TABLET | Freq: Three times a day (TID) | ORAL | Status: DC | PRN

## 2013-03-10 MED ORDER — PANTOPRAZOLE SODIUM 40 MG PO TBEC: 40.0000 mg | DELAYED_RELEASE_TABLET | Freq: Every day | ORAL | Status: DC

## 2013-03-10 NOTE — Progress Notes (Signed)
Family Medicine Office Visit Note   Subjective:   Patient ID: Erika [name], female  DOB: 1980-07-15, 33 y.o.. MRN: [id number]   Pt that comes today complaining of stomach pain since last Sunday (4 days ago). Patient reports the pain is localized in the upper portion of her abdomen burning in nature and extends to mid abdomen. The pain is constant with intensity of 5-7/10, worse if she presses on it and has no change in intensity since started. She reports having initially mild diarrhea but her last stool was yesterday molded to a very thin strip. The amount of gas she is able to pass has decreased per her report.  She denies mucus or blood in her stools.   She has also nausea without vomiting. Denies fever, chills, or hx of sick contacts, alcohol or fatty food ingestion.  Patient has been on extended  course of antibiotic txs due to dental problems since last October and ended treatment about 2 weeks ago  Review of Systems:  Pt denies SOB, chest pain, palpitations, headaches, dizziness, numbness or weakness. Pt denies change in coloration of urine. No dysuria or frequency.   Objective:   Physical Exam: Gen:  NAD HEENT: Moist mucous membranes  CV: Regular rate and rhythm, no murmurs rubs or gallops PULM: Clear to auscultation bilaterally. No wheezes/rales/rhonchi ABD: No distended, normal BS.  Soft, diffusely tender in epigastrium and mesogastrium but no guarding or rebound tenderness. RLQ is not tender. No visceromegaly palpated. Murphy and McBurney's signs are negative. No CVA tenderness.  EXT: No edema Neuro: Alert and oriented x3. No focalization  Assessment & Plan:

## 2013-03-10 NOTE — Patient Instructions (Addendum)
I have placed in orders for labs and x-ray we will discuss the results. Meanwhile you can take Zofran as prescribed for your nausea and pantoprazole for the burning sensation in your stomach. If you develop more acute pain or any other concerning symptoms please get evaluated right away

## 2013-03-11 ENCOUNTER — Telehealth: Payer: Self-pay | Admitting: Family Medicine

## 2013-03-11 DIAGNOSIS — R109 Unspecified abdominal pain: Secondary | ICD-10-CM | POA: Insufficient documentation

## 2013-03-11 NOTE — Telephone Encounter (Signed)
Called pt this am and informed about her labs and xray. Next step will be to explore for cholelithiasis as cause of her symptoms. Ultrasound order was placed and pt was instructed that if she develop vomiting, fever, chills or other concerning symptoms she needs to get evaluated emergently. Regarding her calcification found on xray most likely nephrolithiasis, unsure if this is causing pt's current symptoms since her abdominal pain is more diffuse and locates mostly in epigastric area. Referral for Urology made to evaluate.

## 2013-03-11 NOTE — Assessment & Plan Note (Addendum)
Diffuse in localization. No acute abdomen on examination. Pt also denies symptoms of toxemia and is hemodynamically stable. She is able to keep oral hydration. Plan: cbc, bmet, lipase, KUB. Symptomatic tx with zofran. PPI Will f/u closely and discussed with pt signs of worsening condition that should prompt emergent evaluation.

## 2013-03-13 ENCOUNTER — Emergency Department (HOSPITAL_COMMUNITY): Payer: Medicaid Other

## 2013-03-13 ENCOUNTER — Encounter (HOSPITAL_COMMUNITY): Payer: Self-pay | Admitting: Emergency Medicine

## 2013-03-13 ENCOUNTER — Telehealth: Payer: Self-pay | Admitting: Family Medicine

## 2013-03-13 ENCOUNTER — Emergency Department (HOSPITAL_COMMUNITY)
Admission: EM | Admit: 2013-03-13 | Discharge: 2013-03-13 | Disposition: A | Discharge status: Home / Self Care | Payer: Medicaid Other | Attending: Emergency Medicine | Admitting: Emergency Medicine

## 2013-03-13 ENCOUNTER — Other Ambulatory Visit: Payer: Self-pay

## 2013-03-13 DIAGNOSIS — G43909 Migraine, unspecified, not intractable, without status migrainosus: Secondary | ICD-10-CM | POA: Insufficient documentation

## 2013-03-13 DIAGNOSIS — R1012 Left upper quadrant pain: Secondary | ICD-10-CM | POA: Insufficient documentation

## 2013-03-13 DIAGNOSIS — Z87891 Personal history of nicotine dependence: Secondary | ICD-10-CM | POA: Insufficient documentation

## 2013-03-13 DIAGNOSIS — R1013 Epigastric pain: Secondary | ICD-10-CM | POA: Insufficient documentation

## 2013-03-13 DIAGNOSIS — Z3202 Encounter for pregnancy test, result negative: Secondary | ICD-10-CM | POA: Insufficient documentation

## 2013-03-13 DIAGNOSIS — R109 Unspecified abdominal pain: Secondary | ICD-10-CM | POA: Diagnosis present

## 2013-03-13 DIAGNOSIS — IMO0002 Reserved for concepts with insufficient information to code with codable children: Secondary | ICD-10-CM | POA: Insufficient documentation

## 2013-03-13 DIAGNOSIS — M129 Arthropathy, unspecified: Secondary | ICD-10-CM | POA: Insufficient documentation

## 2013-03-13 DIAGNOSIS — Z9089 Acquired absence of other organs: Secondary | ICD-10-CM | POA: Insufficient documentation

## 2013-03-13 DIAGNOSIS — Z79899 Other long term (current) drug therapy: Secondary | ICD-10-CM | POA: Insufficient documentation

## 2013-03-13 DIAGNOSIS — Z792 Long term (current) use of antibiotics: Secondary | ICD-10-CM | POA: Insufficient documentation

## 2013-03-13 DIAGNOSIS — Z9851 Tubal ligation status: Secondary | ICD-10-CM | POA: Insufficient documentation

## 2013-03-13 DIAGNOSIS — R112 Nausea with vomiting, unspecified: Secondary | ICD-10-CM | POA: Insufficient documentation

## 2013-03-13 DIAGNOSIS — R1110 Vomiting, unspecified: Secondary | ICD-10-CM

## 2013-03-13 LAB — URINALYSIS, ROUTINE W REFLEX MICROSCOPIC
BILIRUBIN URINE: NEGATIVE
Glucose, UA: NEGATIVE mg/dL
Hgb urine dipstick: NEGATIVE
KETONES UR: NEGATIVE mg/dL
Leukocytes, UA: NEGATIVE
NITRITE: NEGATIVE
PH: 6.5 (ref 5.0–8.0)
Protein, ur: NEGATIVE mg/dL
Specific Gravity, Urine: 1.002 — ABNORMAL LOW (ref 1.005–1.030)
Urobilinogen, UA: 0.2 mg/dL (ref 0.0–1.0)

## 2013-03-13 LAB — COMPREHENSIVE METABOLIC PANEL
ALT: 17 U/L (ref 0–35)
AST: 25 U/L (ref 0–37)
Albumin: 4.1 g/dL (ref 3.5–5.2)
Alkaline Phosphatase: 53 U/L (ref 39–117)
BUN: 11 mg/dL (ref 6–23)
CHLORIDE: 103 meq/L (ref 96–112)
CO2: 25 meq/L (ref 19–32)
Calcium: 9.6 mg/dL (ref 8.4–10.5)
Creatinine, Ser: 0.61 mg/dL (ref 0.50–1.10)
GFR calc Af Amer: >90 mL/min (ref >90)
GFR calc non Af Amer: >90 mL/min (ref >90)
GLUCOSE: 94 mg/dL (ref 70–99)
Potassium: 4.6 mEq/L (ref 3.7–5.3)
SODIUM: 141 meq/L (ref 137–147)
TOTAL PROTEIN: 7.9 g/dL (ref 6.0–8.3)
Total Bilirubin: 0.7 mg/dL (ref 0.3–1.2)

## 2013-03-13 LAB — LIPASE, BLOOD: Lipase: 23 U/L (ref 11–59)

## 2013-03-13 LAB — POCT PREGNANCY, URINE: Preg Test, Ur: NEGATIVE

## 2013-03-13 MED ORDER — SUCRALFATE 1 G PO TABS: 1.0000 g | ORAL_TABLET | Freq: Three times a day (TID) | ORAL | Status: DC

## 2013-03-13 MED ORDER — IOHEXOL 300 MG/ML  SOLN
100.0000 mL | Freq: Once | INTRAMUSCULAR | Status: AC | PRN
  Administered 2013-03-13: 100 mL via INTRAVENOUS

## 2013-03-13 MED ORDER — IOHEXOL 300 MG/ML  SOLN
50.0000 mL | Freq: Once | INTRAMUSCULAR | Status: AC | PRN
  Administered 2013-03-13: 50 mL via ORAL

## 2013-03-13 MED ORDER — SODIUM CHLORIDE 0.9 % IV BOLUS (SEPSIS)
1000.0000 mL | INTRAVENOUS | Status: AC
Start: 2013-03-13 — End: 2013-03-13
  Administered 2013-03-13: 1000 mL via INTRAVENOUS

## 2013-03-13 MED ORDER — GI COCKTAIL ~~LOC~~
30.0000 mL | Freq: Once | ORAL | Status: AC
  Administered 2013-03-13: 30 mL via ORAL
  Filled 2013-03-13: qty 30

## 2013-03-13 MED ORDER — ONDANSETRON HCL 4 MG/2ML IJ SOLN
4.0000 mg | Freq: Once | INTRAMUSCULAR | Status: AC
  Administered 2013-03-13: 4 mg via INTRAVENOUS
  Filled 2013-03-13: qty 2

## 2013-03-13 MED ORDER — HYDROMORPHONE HCL PF 1 MG/ML IJ SOLN
1.0000 mg | Freq: Once | INTRAMUSCULAR | Status: AC
  Administered 2013-03-13: 1 mg via INTRAVENOUS
  Filled 2013-03-13 (×2): qty 1

## 2013-03-13 MED ORDER — GI COCKTAIL ~~LOC~~: 30.0000 mL | Freq: Once | ORAL | Status: DC

## 2013-03-13 NOTE — Discharge Instructions (Signed)
Abdominal Pain, Women °Abdominal (stomach, pelvic, or belly) pain can be caused by many things. It is important to tell your doctor: °· The location of the pain. °· Does it come and go or is it present all the time? °· Are there things that start the pain (eating certain foods, exercise)? °· Are there other symptoms associated with the pain (fever, nausea, vomiting, diarrhea)? °All of this is helpful to know when trying to find the cause of the pain. °CAUSES  °· Stomach: virus or bacteria infection, or ulcer. °· Intestine: appendicitis (inflamed appendix), regional ileitis (Crohn's disease), ulcerative colitis (inflamed colon), irritable bowel syndrome, diverticulitis (inflamed diverticulum of the colon), or cancer of the stomach or intestine. °· Gallbladder disease or stones in the gallbladder. °· Kidney disease, kidney stones, or infection. °· Pancreas infection or cancer. °· Fibromyalgia (pain disorder). °· Diseases of the female organs: °· Uterus: fibroid (non-cancerous) tumors or infection. °· Fallopian tubes: infection or tubal pregnancy. °· Ovary: cysts or tumors. °· Pelvic adhesions (scar tissue). °· Endometriosis (uterus lining tissue growing in the pelvis and on the pelvic organs). °· Pelvic congestion syndrome (female organs filling up with blood just before the menstrual period). °· Pain with the menstrual period. °· Pain with ovulation (producing an egg). °· Pain with an IUD (intrauterine device, birth control) in the uterus. °· Cancer of the female organs. °· Functional pain (pain not caused by a disease, may improve without treatment). °· Psychological pain. °· Depression. °DIAGNOSIS  °Your doctor will decide the seriousness of your pain by doing an examination. °· Blood tests. °· X-rays. °· Ultrasound. °· CT scan (computed tomography, special type of X-ray). °· MRI (magnetic resonance imaging). °· Cultures, for infection. °· Barium enema (dye inserted in the large intestine, to better view it with  X-rays). °· Colonoscopy (looking in intestine with a lighted tube). °· Laparoscopy (minor surgery, looking in abdomen with a lighted tube). °· Major abdominal exploratory surgery (looking in abdomen with a large incision). °TREATMENT  °The treatment will depend on the cause of the pain.  °· Many cases can be observed and treated at home. °· Over-the-counter medicines recommended by your caregiver. °· Prescription medicine. °· Antibiotics, for infection. °· Birth control pills, for painful periods or for ovulation pain. °· Hormone treatment, for endometriosis. °· Nerve blocking injections. °· Physical therapy. °· Antidepressants. °· Counseling with a psychologist or psychiatrist. °· Minor or major surgery. °HOME CARE INSTRUCTIONS  °· Do not take laxatives, unless directed by your caregiver. °· Take over-the-counter pain medicine only if ordered by your caregiver. Do not take aspirin because it can cause an upset stomach or bleeding. °· Try a clear liquid diet (broth or water) as ordered by your caregiver. Slowly move to a bland diet, as tolerated, if the pain is related to the stomach or intestine. °· Have a thermometer and take your temperature several times a day, and record it. °· Bed rest and sleep, if it helps the pain. °· Avoid sexual intercourse, if it causes pain. °· Avoid stressful situations. °· Keep your follow-up appointments and tests, as your caregiver orders. °· If the pain does not go away with medicine or surgery, you may try: °· Acupuncture. °· Relaxation exercises (yoga, meditation). °· Group therapy. °· Counseling. °SEEK MEDICAL CARE IF:  °· You notice certain foods cause stomach pain. °· Your home care treatment is not helping your pain. °· You need stronger pain medicine. °· You want your IUD removed. °· You feel faint or   lightheaded. °· You develop nausea and vomiting. °· You develop a rash. °· You are having side effects or an allergy to your medicine. °SEEK IMMEDIATE MEDICAL CARE IF:  °· Your  pain does not go away or gets worse. °· You have a fever. °· Your pain is felt only in portions of the abdomen. The right side could possibly be appendicitis. The left lower portion of the abdomen could be colitis or diverticulitis. °· You are passing blood in your stools (bright red or black tarry stools, with or without vomiting). °· You have blood in your urine. °· You develop chills, with or without a fever. °· You pass out. °MAKE SURE YOU:  °· Understand these instructions. °· Will watch your condition. °· Will get help right away if you are not doing well or get worse. °Document Released: 12/15/2006 Document Revised: 05/12/2011 Document Reviewed: 01/04/2009 °[employer]® Patient Information ©2014 [employer]. ° °

## 2013-03-13 NOTE — Telephone Encounter (Signed)
After hours line  Patient calling in complaining of 10+/10 (comparing to childbirth) burning diffuse abd pain. She also notes some bloody thin rectal discharge while laying in bed and constipation. She had one episode of NBNB emesis and has had continued nausea and PO food intolerance. She is tolerating PO water.  She was seen by her PCP who ordered a KUB which shows a 4 mm ureteral stone that is likely not the source of pain given how diffuse her pain is. Her PCP has also ordered a RUQ abd [place].   Patient states taht her pain is currently so severe that she wants to seek care at teh ED. I think this is reasonable and encouraged her to go.   I appreciate the ED provider's assistance with this patient.   Laroy Apple, MD [facility] Resident, PGY-2 03/13/2013, 7:05 AM

## 2013-03-13 NOTE — ED Provider Notes (Signed)
CSN: [id number]     Arrival date & time 03/13/13  0705 History   First MD Initiated Contact with Patient 03/13/13 0710     Chief Complaint  Patient presents with  . Abdominal Pain   (Consider location/radiation/quality/duration/timing/severity/associated sxs/prior Treatment) Patient is a 33 y.o. female presenting with abdominal pain. The history is provided by the patient.  Abdominal Pain Pain location:  Epigastric and LUQ Pain quality: burning   Pain radiates to:  Does not radiate Pain severity:  Moderate Onset quality:  Gradual Duration:  7 days Timing:  Constant Progression:  Worsening Chronicity:  New Context comment:  Worse w/ eating Relieved by:  Nothing Worsened by:  Nothing tried Ineffective treatments:  None tried Associated symptoms: nausea and vomiting   Associated symptoms: no chest pain, no cough, no diarrhea, no dysuria, no fatigue, no fever, no hematuria and no shortness of breath     Past Medical History  Diagnosis Date  . Migraine   . Back pain   . Arthritis    Past Surgical History  Procedure Laterality Date  . Cesarean section    . Tubal ligation    . 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 Tonsillectomy    . Adenoidectomy    . Nasal sinus surgery     Family History  Problem Relation Age of Onset  . Cancer Maternal Grandmother     breast ca, cervical  . Cancer Paternal Grandmother     breast ca  . Alcohol abuse Father    History  Substance Use Topics  . Smoking status: Former Smoker -- 0.50 packs/day  . Smokeless tobacco: Former Systems developer    Quit date: 05/01/2011  . Alcohol Use: Yes   OB History   Grav Para Term Preterm Abortions TAB SAB Ect Mult Living                 Review of Systems  Constitutional: Negative for fever and fatigue.  HENT: Negative for congestion and drooling.   Eyes: Negative for pain.  Respiratory: Negative for cough and shortness of breath.   Cardiovascular: Negative for chest pain.   Gastrointestinal: Positive for nausea, vomiting and abdominal pain. Negative for diarrhea.  Genitourinary: Negative for dysuria and hematuria.       Suprapubic pressure  Musculoskeletal: Negative for back pain, gait problem and neck pain.  Skin: Negative for color change.  Neurological: Negative for dizziness and headaches.  Hematological: Negative for adenopathy.  Psychiatric/Behavioral: Negative for behavioral problems.  All other systems reviewed and are negative.    Allergies  Review of patient's allergies indicates no known allergies.  Home Medications   Current Outpatient Rx  Name  Route  Sig  Dispense  Refill  . acetaminophen (TYLENOL) 500 MG tablet   Oral   Take 1 tablet (500 mg total) by mouth every 6 (six) hours as needed for pain.   30 tablet   0   . amoxicillin-clavulanate (AUGMENTIN) 875-125 MG per tablet   Oral   Take 1 tablet by mouth 2 (two) times daily. Take for 7 days.   14 tablet   0   . cyclobenzaprine (FLEXERIL) 10 MG tablet   Oral   Take 10 mg by mouth 3 (three) times daily as needed.         Marland Kitchen dextromethorphan-guaiFENesin (MUCINEX DM) 30-600 MG per 12 hr tablet   Oral   Take 1 tablet by mouth every 12 (twelve) hours as  needed (for cough or congestion).   14 tablet   0   . EXPIRED: famotidine (PEPCID) 20 MG tablet   Oral   Take 1 tablet (20 mg total) by mouth 2 (two) times daily.   60 tablet   0   . fluticasone (FLONASE) 50 MCG/ACT nasal spray   Nasal   Place 2 sprays into the nose daily.   16 g   6   . guaiFENesin (MUCINEX) 600 MG 12 hr tablet   Oral   Take 2 tablets (1,200 mg total) by mouth 2 (two) times daily.   30 tablet   0   . Hydrocodone-Chlorpheniramine 5-4 MG/5ML SOLN   Oral   Take 5 mLs by mouth every 6 (six) hours as needed (for cough).   480 mL   0   . ondansetron (ZOFRAN ODT) 4 MG disintegrating tablet   Oral   Take 1 tablet (4 mg total) by mouth every 8 (eight) hours as needed for nausea or vomiting.   20  tablet   0   . oxycodone (OXY-IR) 5 MG capsule   Oral   Take 5 mg by mouth every 4 (four) hours as needed. Prescribed by Neurologist in [facility] for migraines.         Marland Kitchen oxyCODONE-acetaminophen (PERCOCET/ROXICET) 5-325 MG per tablet   Oral   Take 2 tablets by mouth every 4 (four) hours as needed for pain.   10 tablet   0   . pantoprazole (PROTONIX) 40 MG tablet   Oral   Take 1 tablet (40 mg total) by mouth daily.   30 tablet   3   . SUMAtriptan (IMITREX) 50 MG tablet      1 pill at start of migraine symptoms. If no improvement 2 pills 2 hours later.   10 tablet   3   . EXPIRED: topiramate (TOPAMAX) 25 MG tablet   Oral   Take 1 tablet (25 mg total) by mouth at bedtime.   30 tablet   1   . triamcinolone cream (KENALOG) 0.1 %   Topical   Apply topically 2 (two) times daily.   30 g   0    BP 107/60  Pulse 66  Temp(Src) 98.1 F (36.7 C) (Oral)  Resp 18  Wt 223 lb (101.152 kg)  SpO2 100%  LMP 03/03/2013 Physical Exam  Nursing note and vitals reviewed. Constitutional: She is oriented to person, place, and time. She appears well-developed and well-nourished.  HENT:  Head: Normocephalic.  Mouth/Throat: Oropharynx is clear and moist. No oropharyngeal exudate.  Eyes: Conjunctivae and EOM are normal. Pupils are equal, round, and reactive to light.  Neck: Normal range of motion. Neck supple.  Cardiovascular: Normal rate, regular rhythm, normal heart sounds and intact distal pulses.  Exam reveals no gallop and no friction rub.   No murmur heard. Pulmonary/Chest: Effort normal and breath sounds normal. No respiratory distress. She has no wheezes.  Abdominal: Soft. Bowel sounds are normal. There is tenderness (mild tenderness to palpation of the epigastric area and left upper quadrant.). There is no rebound and no guarding.  Musculoskeletal: Normal range of motion. She exhibits no edema and no tenderness.  Neurological: She is alert and oriented to person, place, and  time.  Skin: Skin is warm and dry.  Psychiatric: She has a normal mood and affect. Her behavior is normal.    ED Course  Procedures (including critical care time) Labs Review Labs Reviewed  URINALYSIS, ROUTINE W REFLEX MICROSCOPIC -  Abnormal; Notable for the following:    APPearance CLOUDY (*)    Specific Gravity, Urine 1.002 (*)    All other components within normal limits  LIPASE, BLOOD  COMPREHENSIVE METABOLIC PANEL  CBC WITH DIFFERENTIAL  CBC WITH DIFFERENTIAL  POCT PREGNANCY, URINE   Imaging Review Ct Abdomen Pelvis W Contrast  03/13/2013   CLINICAL DATA:  Abdominal pain, low back pain, nausea  EXAM: CT ABDOMEN AND PELVIS WITH CONTRAST  TECHNIQUE: Multidetector CT imaging of the abdomen and pelvis was performed using the standard protocol following bolus administration of intravenous contrast.  CONTRAST:  164mL OMNIPAQUE IOHEXOL 300 MG/ML  SOLN  COMPARISON:  03/10/2013 plain radiographs of the abdomen  FINDINGS: Minor subpleural right base atelectasis. No lower lobe pneumonia, collapse or consolidation. Normal heart size. No pericardial or pleural effusion. Negative for hiatal hernia.  Abdomen: Diffuse hypoattenuation of the liver compatible with hepatic steatosis or fatty infiltration. No definite focal hepatic abnormality. No biliary dilatation. Hepatic and portal veins are patent. Gallbladder, biliary system, pancreas, spleen, accessory splenule, adrenal glands, and kidneys are within normal limits for age and demonstrate no acute process. No renal obstruction, hydronephrosis, or obstructing ureteral calculus on either side.  No abdominal free fluid, fluid collection, hemorrhage, abscess, or adenopathy.  Negative for bowel obstruction, dilatation, ileus, or free air. Slightly prominent appendix but no surrounding inflammatory change. .  Circumaortic left renal vein noted, a normal variant.  Negative for aneurysm.  Pelvis: Urinary bladder unremarkable. Uterus and adnexal normal in size.  Small left ovary collapsing cyst or follicle with peripheral enhancement measures 15 mm, image 85. No significant pelvic free fluid, fluid collection, hemorrhage, abscess, adenopathy, inguinal abnormality, or hernia. Minor sigmoid diverticulosis. No acute distal bowel process.  Minor lower lumbar spine facet arthropathy and SI joint sclerosis.  IMPRESSION: Hepatic steatosis or fatty infiltration of the liver  No acute intra-abdominal or pelvic finding by CT  Negative for obstructing urinary tract or ureteral calculus  Minor sigmoid diverticulosis   Electronically Signed   By: Daryll Brod M.D.   On: 03/13/2013 10:07    EKG Interpretation   None       Date: 03/13/2013  Rate: 63  Rhythm: normal sinus rhythm  QRS Axis: normal  Intervals: normal  ST/T Wave abnormalities: normal  Conduction Disutrbances:none  Narrative Interpretation: No ST or T wave changes consistent with ischemia.    Old EKG Reviewed: none available   MDM   1. Abdominal pain   2. Vomiting    7:32 AM 33 y.o. female who presents with abdominal pain for one week. She notes a burning epigastric and left upper quadrant pain worse with eating. She was seen by her PCP who prescribed her on protonix. Her PCP also ordered screening lab work and an x-ray which was suspicious for a right-sided kidney stone. The patient notes ongoing pain and now vomiting. She is afebrile and vital signs are unremarkable here. Will get screening labwork. As her pain is not localized to the right upper quadrant I will plan to evaluate her with a CT of her abdomen.  11:41 AM: The patient has not had vomiting here in the emergency department to my knowledge. She is tolerating ginger ale on exam. Her labwork is thus far noncontributory. Her CBC was clotted but her other lab work being noncontributory I decided not to repeat this lab. She is on Protonix and Zofran per her PCP. She also has oxycodone at home. Will add Carafate to her regimen as she  primarily  has pain with meals. She has a scheduled ultrasound next week and I recommended she followup with her primary care doctor tomorrow. I have discussed the diagnosis/risks/treatment options with the patient and family and believe the pt to be eligible for discharge home to follow-up with pcp tomorrow. We also discussed returning to the ED immediately if new or worsening sx occur. We discussed the sx which are most concerning (e.g., worsening pain, fever, inability to tolerate po) that necessitate immediate return. Any new prescriptions provided to the patient are listed below.  New Prescriptions   SUCRALFATE (CARAFATE) 1 G TABLET    Take 1 tablet (1 g total) by mouth 4 (four) times daily -  with meals and at bedtime.     Blanchard Kelch, MD 03/13/13 1158

## 2013-03-13 NOTE — ED Notes (Signed)
Pt is here with all over abdominal pain and reports lower back pain that is chronic.  Pt states saw PMD last week and had xray that showed kidney stone and PMD scheduled for patient to have ultrasound.  Pt reports nausea.  No diarrhea.

## 2013-03-16 ENCOUNTER — Ambulatory Visit
Admission: RE | Admit: 2013-03-16 | Discharge: 2013-03-16 | Disposition: A | Discharge status: Home / Self Care | Payer: Medicaid Other | Source: Ambulatory Visit | Attending: Family Medicine | Admitting: Family Medicine

## 2013-03-16 DIAGNOSIS — R109 Unspecified abdominal pain: Secondary | ICD-10-CM

## 2013-03-18 ENCOUNTER — Other Ambulatory Visit: Payer: Self-pay | Admitting: Family Medicine

## 2013-03-18 ENCOUNTER — Telehealth: Payer: Self-pay | Admitting: Family Medicine

## 2013-03-18 ENCOUNTER — Ambulatory Visit: Payer: Medicaid Other | Admitting: Family Medicine

## 2013-03-18 DIAGNOSIS — R109 Unspecified abdominal pain: Secondary | ICD-10-CM

## 2013-03-18 NOTE — Telephone Encounter (Signed)
Missed appt for today, but would like U/S results given to her. Please call patient.

## 2013-04-01 ENCOUNTER — Encounter: Payer: Self-pay | Admitting: Internal Medicine

## 2013-04-20 ENCOUNTER — Other Ambulatory Visit: Payer: Self-pay | Admitting: Family Medicine

## 2013-04-26 ENCOUNTER — Encounter: Payer: Self-pay | Admitting: Physician Assistant

## 2013-04-26 ENCOUNTER — Ambulatory Visit: Payer: Medicaid Other | Admitting: Internal Medicine

## 2013-05-03 ENCOUNTER — Ambulatory Visit (INDEPENDENT_AMBULATORY_CARE_PROVIDER_SITE_OTHER): Payer: Medicaid Other | Admitting: Physician Assistant

## 2013-05-03 ENCOUNTER — Encounter: Payer: Self-pay | Admitting: Physician Assistant

## 2013-05-03 VITALS — BP 100/60 | HR 66 | Ht 64.0 in | Wt 223.2 lb

## 2013-05-03 DIAGNOSIS — K760 Fatty (change of) liver, not elsewhere classified: Secondary | ICD-10-CM

## 2013-05-03 DIAGNOSIS — R110 Nausea: Secondary | ICD-10-CM

## 2013-05-03 DIAGNOSIS — K7689 Other specified diseases of liver: Secondary | ICD-10-CM

## 2013-05-03 DIAGNOSIS — R109 Unspecified abdominal pain: Secondary | ICD-10-CM

## 2013-05-03 DIAGNOSIS — R1013 Epigastric pain: Secondary | ICD-10-CM

## 2013-05-03 MED ORDER — PANTOPRAZOLE SODIUM 40 MG PO TBEC: 40.0000 mg | DELAYED_RELEASE_TABLET | Freq: Two times a day (BID) | ORAL | Status: DC

## 2013-05-03 MED ORDER — ONDANSETRON HCL 4 MG PO TABS: ORAL_TABLET | ORAL | Status: DC

## 2013-05-03 NOTE — Progress Notes (Signed)
Subjective:    Patient ID: Erika [name], female    DOB: 03/13/1980, 33 y.o.   MRN: [id number]  HPI Erika [name] is a pleasant 33 year old white female new to GI today. She has had 2 ER visits in the past couple of months with complaints of abdominal pain. Last visit was in January 2015. At that time dhe had lab work done which was unremarkable and upper common ultrasound which showed a fatty liver and otherwise normal study. CT scan of the abdomen and pelvis was also done at that time negative with the exception of hepatic steatosis.  Patient is status post C-section and bilateral tubal ligation. She's not had any previous GI issues. She says her current symptoms started in December 2014 and that she had fairly constant upper abdominal pain for several weeks. She says that she pain is not quite as bad currently and she has been taking Protonix on a regular basis. She has nausea which is worse with greasy or high-fat foods. He has had some sporadic nausea and vomiting but says that's rare at this point. No fever or chills. She says she usually has ongoing problems with constipation but has been having some alternating bowel habits more recently. No melena or hematochezia. She describes mid abdominal burning and discomfort across her upper abdomen.  Patient had been on Celebrex on a regular basis for several months for back pain and has been off of that over the past month. Again she has been taking Protonix 40 mg by mouth daily and had a short course of Carafate which she did feel was helpful. No regular heartburn or indigestion- she does have vague intermittent complaints of solid food dysphagia. - She takes Percocet rarely and has stopped Topamax and Pepcid both of which are on her meds list .   Review of Systems  Constitutional: Negative.   HENT: Positive for trouble swallowing.   Eyes: Negative.   Respiratory: Negative.   Cardiovascular: Negative.   Gastrointestinal: Positive for nausea, vomiting,  abdominal pain, diarrhea and constipation.  Endocrine: Negative.   Genitourinary: Negative.   Musculoskeletal: Positive for back pain.  Allergic/Immunologic: Negative.   Neurological: Negative.   Hematological: Negative.   Psychiatric/Behavioral: Negative.    Outpatient Prescriptions Prior to Visit  Medication Sig Dispense Refill  . celecoxib (CELEBREX) 200 MG capsule Take 200 mg by mouth daily.      . cyclobenzaprine (FLEXERIL) 10 MG tablet Take 10 mg by mouth 3 (three) times daily as needed for muscle spasms.       . fluticasone (FLONASE) 50 MCG/ACT nasal spray PLACE 2 SPRAYS INTO THE NOSE DAILY.  16 g  6  . oxycodone (OXY-IR) 5 MG capsule Take 5 mg by mouth every 8 (eight) hours as needed for pain. Prescribed by Neurologist in [facility] for migraines.      . sucralfate (CARAFATE) 1 G tablet Take 1 tablet (1 g total) by mouth 4 (four) times daily -  with meals and at bedtime.  50 tablet  0  . SUMAtriptan (IMITREX) 50 MG tablet Take 50 mg by mouth every 2 (two) hours as needed for migraine or headache. 1 pill at start of migraine symptoms. If no improvement 2 pills 2 hours later.  10 tablet  3  . pantoprazole (PROTONIX) 40 MG tablet Take 1 tablet (40 mg total) by mouth daily.  30 tablet  3  . famotidine (PEPCID) 20 MG tablet Take 1 tablet (20 mg total) by mouth 2 (two) times daily.  60 tablet  0  . topiramate (TOPAMAX) 25 MG tablet Take 1 tablet (25 mg total) by mouth at bedtime.  30 tablet  1  . ondansetron (ZOFRAN ODT) 4 MG disintegrating tablet Take 1 tablet (4 mg total) by mouth every 8 (eight) hours as needed for nausea or vomiting.  20 tablet  0   No facility-administered medications prior to visit.   No Known Allergies Patient Active Problem List   Diagnosis Date Noted  . Abdominal pain 03/13/2013  . Abdominal pain, unspecified site 03/11/2013  . Seasonal allergies 06/21/2012  . Acute sinusitis 05/18/2012  . Gastroenteritis 03/15/2012  . Elevated LDL cholesterol level  08/30/2011  . Constipation 06/05/2011  . Migraine aura, persistent, intractable 03/06/2011  . LOW BACK PAIN, CHRONIC 08/14/2009  . Moderate dysplasia of cervix 01/17/2008  . ACL TEAR 04/26/2007  . GOITER NOS 04/30/2006  . OBESITY, NOS 04/30/2006  . BIPOLAR DISORDER 04/30/2006   History  Substance Use Topics  . Smoking status: Former Smoker -- 0.50 packs/day  . Smokeless tobacco: Never Used  . Alcohol Use: Yes     Comment: rare   family history includes Alcohol abuse in her father; Breast cancer in an other family member; Cancer in her maternal grandmother and paternal grandmother. There is no history of Colon cancer.     Objective:   Physical Exam well-developed white female in no acute distress, pleasant obese blood pressure 100/60 pulse 66 height 5 foot 4 weight 223. HEENT; nontraumatic, normocephalic EOMI PERRLA sclera anicteric, Supple; no JVD, Cardiovascular; regular rate and rhythm with S1-S2 no murmur or gallop, Pulmonary; clear bilaterally, Abdomen; large, soft ,she is mildly tender in the epigastrium there is no guarding or rebound no palpable mass or hepatosplenomegaly bowel sounds are present, Rectal; exam not done, Extremities ;no clubbing cyanosis or edema skin warm and dry, Psych; mood and affect appropriate        Assessment & [name]:   #24  33 year old female with 2-3 month history of upper abdominal burning type discomfort and intermittent nausea. Symptoms somewhat improved after starting Protonix 40 mg by mouth daily. Initial workup in the ER with negative CT scan and upper abdominal ultrasound save finding of hepatic steatosis. I  suspect she has an NSAID-induced gastropathy or peptic ulcer disease. #2 hepatic steatosis #3 bipolar disorder #4 history of migraines #5 obesity  [name]; Will increase Protonix to 40 mg by mouth twice daily x1 month and then back to once daily if improved symptomatically Start Zofran 4 mg q. 8 hours when necessary for nausea #40 Patient is  advised to stay off of Celebrex and all anti-inflammatory medications Schedule for upper endoscopy with Dr. Arlyce Dice- procedure discussed in detail with the patient and she is agreeable to proceed . Bland diet  Discussed initial measures for fatty liver including low-fat low-cholesterol diet and weight loss. Will check a lipid panel. Previous LFTs were normal.

## 2013-05-03 NOTE — Patient Instructions (Signed)
Come at 1:30 Pm on Friday 3-6 to our lab in the basement for fasting blood work.  You have been scheduled for a colonoscopy with propofol. Please follow written instructions given to you at your visit today.  Please pick up your prep kit at the pharmacy within the next 1-3 days. If you use inhalers (even only as needed), please bring them with you on the day of your procedure.  We sent prescriptions to [facility] [address] [place]. 1. Protonix twice daily. 2. Zofran 4 mg for nausea.   No inflammatories. No Celebrex.

## 2013-05-04 NOTE — Progress Notes (Signed)
Reviewed and agree with management. Robert D. Kaplan, M.D., FACG  

## 2013-05-06 ENCOUNTER — Other Ambulatory Visit (INDEPENDENT_AMBULATORY_CARE_PROVIDER_SITE_OTHER): Payer: Medicaid Other

## 2013-05-06 ENCOUNTER — Encounter: Payer: Self-pay | Admitting: Gastroenterology

## 2013-05-06 ENCOUNTER — Ambulatory Visit (AMBULATORY_SURGERY_CENTER): Payer: Medicaid Other | Admitting: Gastroenterology

## 2013-05-06 VITALS — BP 118/69 | HR 55 | Temp 98.1°F | Resp 19 | Ht 64.0 in | Wt 223.0 lb

## 2013-05-06 DIAGNOSIS — K7689 Other specified diseases of liver: Secondary | ICD-10-CM

## 2013-05-06 DIAGNOSIS — R1013 Epigastric pain: Secondary | ICD-10-CM

## 2013-05-06 DIAGNOSIS — R110 Nausea: Secondary | ICD-10-CM

## 2013-05-06 LAB — LIPID PANEL
CHOLESTEROL: 162 mg/dL (ref 0–200)
HDL: 42 mg/dL (ref >39.00)
LDL Cholesterol: 99 mg/dL (ref 0–99)
Total CHOL/HDL Ratio: 4
Triglycerides: 106 mg/dL (ref 0.0–149.0)
VLDL: 21.2 mg/dL (ref 0.0–40.0)

## 2013-05-06 MED ORDER — HYOSCYAMINE SULFATE ER 0.375 MG PO TBCR: EXTENDED_RELEASE_TABLET | ORAL | Status: DC

## 2013-05-06 MED ORDER — SODIUM CHLORIDE 0.9 % IV SOLN: 500.0000 mL | INTRAVENOUS | Status: DC

## 2013-05-06 NOTE — Progress Notes (Signed)
Per Mont Dutton patient recovered in procedure room 15 minutes, remaining recovery 20 minutes.

## 2013-05-06 NOTE — Progress Notes (Signed)
Patient denies any allergies to eggs or soy. Patient denies any problems with anesthesia.  

## 2013-05-06 NOTE — Progress Notes (Signed)
Began to recover patient at [phone] this includes transfer to PACU, report to pacu rn, vss, bbs=clear.

## 2013-05-06 NOTE — Patient Instructions (Signed)
YOU HAD AN ENDOSCOPIC PROCEDURE TODAY AT THE [facility]: Refer to the procedure report that was given to you for any specific questions about what was found during the examination.  If the procedure report does not answer your questions, please call your gastroenterologist to clarify.  If you requested that your care partner not be given the details of your procedure findings, then the procedure report has been included in a sealed envelope for you to review at your convenience later.  YOU SHOULD EXPECT: Some feelings of bloating in the abdomen. Passage of more gas than usual.  Walking can help get rid of the air that was put into your GI tract during the procedure and reduce the bloating. If you had a lower endoscopy (such as a colonoscopy or flexible sigmoidoscopy) you may notice spotting of blood in your stool or on the toilet paper. If you underwent a bowel prep for your procedure, then you may not have a normal bowel movement for a few days.  DIET: Your first meal following the procedure should be a light meal and then it is ok to progress to your normal diet.  A half-sandwich or bowl of soup is an example of a good first meal.  Heavy or fried foods are harder to digest and may make you feel nauseous or bloated.  Likewise meals heavy in dairy and vegetables can cause extra gas to form and this can also increase the bloating.  Drink plenty of fluids but you should avoid alcoholic beverages for 24 hours.  ACTIVITY: Your care partner should take you home directly after the procedure.  You should plan to take it easy, moving slowly for the rest of the day.  You can resume normal activity the day after the procedure however you should NOT DRIVE or use heavy machinery for 24 hours (because of the sedation medicines used during the test).    SYMPTOMS TO REPORT IMMEDIATELY: A gastroenterologist can be reached at any hour.  During normal business hours, 8:30 AM to 5:00 PM Monday through Friday,  call [phone].  After hours and on weekends, please call the GI answering service at [phone] who will take a message and have the physician on call contact you.   Following upper endoscopy (EGD)  Vomiting of blood or coffee ground material  New chest pain or pain under the shoulder blades  Painful or persistently difficult swallowing  New shortness of breath  Fever of 100F or higher  Black, tarry-looking stools  FOLLOW UP: If any biopsies were taken you will be contacted by phone or by letter within the next 1-3 weeks.  Call your gastroenterologist if you have not heard about the biopsies in 3 weeks.  Our staff will call the home number listed on your records the next business day following your procedure to check on you and address any questions or concerns that you may have at that time regarding the information given to you following your procedure. This is a courtesy call and so if there is no answer at the home number and we have not heard from you through the emergency physician on call, we will assume that you have returned to your regular daily activities without incident.  SIGNATURES/CONFIDENTIALITY: You and/or your care partner have signed paperwork which will be entered into your electronic medical record.  These signatures attest to the fact that that the information above on your After Visit Summary has been reviewed and is understood.  Full responsibility   of the confidentiality of this discharge information lies with you and/or your care-partner.  Recommendations See procedure report 

## 2013-05-06 NOTE — Op Note (Signed)
[facility]  [address] [place], [address]   ENDOSCOPY PROCEDURE REPORT  PATIENT: Erika [name], Erika [name]  MR#: [id number] BIRTHDATE: [birth year] , 33  yrs. old GENDER: Female ENDOSCOPIST: Inda Castle, MD REFERRED BY: PROCEDURE DATE:  05/06/2013 PROCEDURE:  EGD, diagnostic ASA CLASS:     Class II INDICATIONS:  Epigastric pain. MEDICATIONS: MAC sedation, administered by CRNA, propofol (Diprivan) 150mg  IV, and Simethicone 0.6cc PO TOPICAL ANESTHETIC: Cetacaine Spray  DESCRIPTION OF PROCEDURE: After the risks benefits and alternatives of the procedure were thoroughly explained, informed consent was obtained.  The LB [id number] endoscope was introduced through the mouth and advanced to the third portion of the duodenum. Without limitations.  The instrument was slowly withdrawn as the mucosa was fully examined.      The upper, middle and distal third of the esophagus were carefully inspected and no abnormalities were noted.  The z-line was well seen at the GEJ.  The endoscope was pushed into the fundus which was normal including a retroflexed view.  The antrum, gastric body, first and second part of the duodenum were unremarkable. Retroflexed views revealed no abnormalities.     The scope was then withdrawn from the patient and the procedure completed.  COMPLICATIONS: There were no complications. ENDOSCOPIC IMPRESSION: Normal EGD  RECOMMENDATIONS: 1.  protonix 58milligrams twice a day for 2-4 weeks then daily 2.  hyomax as needed for abdominal pain 3.  office visit 4-5 weeks  REPEAT EXAM:  eSigned:  Inda Castle, MD 05/06/2013 3:06 PM   CC: Dayarmas Piloto de Gwendalyn Ege, MD

## 2013-05-09 ENCOUNTER — Telehealth: Payer: Self-pay

## 2013-05-09 NOTE — Telephone Encounter (Signed)
  Follow up Call-  Call back number 05/06/2013  Post procedure Call Back phone  # [phone] cell  Permission to leave phone message No     Patient questions:  Do you have a fever, pain , or abdominal swelling? no Pain Score  0 *  Have you tolerated food without any problems? yes  Have you been able to return to your normal activities? yes  Do you have any questions about your discharge instructions: Diet   no Medications  no Follow up visit  no  Do you have questions or concerns about your Care? no  Actions: * If pain score is 4 or above: No action needed, pain <4.  Per the pt she had a sore throat over the weekend, but feels fine this am.  maw

## 2013-06-01 ENCOUNTER — Ambulatory Visit (INDEPENDENT_AMBULATORY_CARE_PROVIDER_SITE_OTHER): Payer: Medicaid Other | Admitting: Family Medicine

## 2013-06-01 ENCOUNTER — Encounter: Payer: Self-pay | Admitting: Family Medicine

## 2013-06-01 VITALS — BP 110/57 | HR 92 | Temp 98.2°F | Ht 64.0 in | Wt 223.0 lb

## 2013-06-01 DIAGNOSIS — J329 Chronic sinusitis, unspecified: Secondary | ICD-10-CM

## 2013-06-01 DIAGNOSIS — N898 Other specified noninflammatory disorders of vagina: Secondary | ICD-10-CM

## 2013-06-01 DIAGNOSIS — N871 Moderate cervical dysplasia: Secondary | ICD-10-CM

## 2013-06-01 DIAGNOSIS — J019 Acute sinusitis, unspecified: Secondary | ICD-10-CM

## 2013-06-01 DIAGNOSIS — E669 Obesity, unspecified: Secondary | ICD-10-CM

## 2013-06-01 LAB — POCT WET PREP (WET MOUNT): Clue Cells Wet Prep Whiff POC: NEGATIVE

## 2013-06-01 NOTE — Progress Notes (Signed)
Family Medicine Office Visit Note   Subjective:   Patient ID: Erika [name], female  DOB: 1980/07/26, 33 y.o.. MRN: [id number]   Pt that comes today for her annual exam. Her current concern is vaginal discharge for about 1 week. She is not concerned about STD's. Last pap smear was 08/2010, pt will be due on second semester of this year. She declines pap smear today. No fever, nausea, vomiting or  rashes She denies pelvic pain and continues with mild epigastric pain that has been studied by GI  With recent normal upper endoscopy. She is taking Protonix and this helps.   Pt with PMX of recent dental extraction complicated with sinus infection evaluated and treated by Dr. Wilburn Cornelia (ENT) She needs to f/u and per insurance requirements she is requesting formal referral in order to schedule her f/u appointment. Also remote hx of adenoid and tonsillectomy.   Obesity: would like a referral to nutrition for weigh loss. Reports being a "[place] girl" and tend to cook not in a healthy way. Has made some change son her diet but still working on portion control. Has a child overweight and is motivated to learn better nutritional habits.   Review of Systems:  Pt denies SOB, chest pain, palpitations, headaches, dizziness, numbness or weakness. No changes on urinary or BM habits. No unintentional weigh loss/gain.  Objective:   Physical Exam: Gen:  Obese, NAD HEENT: Moist mucous membranes  CV: Regular rate and rhythm, no murmurs rubs or gallops PULM: Clear to auscultation bilaterally. No wheezes/rales/rhonchi ABD: Soft, non tender, non distended, normal bowel sounds EXT: No edema Neuro: Alert and oriented x3. No focalization Vulva and perianal area: Normal  Speculum: Vagina and cervix of normal appearance, no friability, mild white discharge. Bimanual exam: Uterus anteverted, no adnexal masses. No cervical motion tenderness.  Assessment & Plan:

## 2013-06-01 NOTE — Patient Instructions (Addendum)
I have placed two referrals, one for ENT for follow up your sinusitis and another one for Nutrition. On the second one (nutrition) you need to call to schedule your appointment. Card has been provided. Regarding your vaginal discharge you can take metronidazole as prescribed and f/u if no improvement or worsening of your symptoms. Remember to make appointment for pap smear on 08/2013

## 2013-06-02 DIAGNOSIS — N898 Other specified noninflammatory disorders of vagina: Secondary | ICD-10-CM | POA: Insufficient documentation

## 2013-06-02 MED ORDER — METRONIDAZOLE 500 MG PO TABS: 500.0000 mg | ORAL_TABLET | Freq: Two times a day (BID) | ORAL | Status: DC

## 2013-06-02 NOTE — Assessment & Plan Note (Signed)
Pap smear is due 08/2013. Discussed with pt she states will make appointment for this.

## 2013-06-02 NOTE — Assessment & Plan Note (Signed)
Positive for BV. Offered treatment with metronidazole. Pt agreeable since she has symptoms.  F/u as needed

## 2013-06-02 NOTE — Assessment & Plan Note (Signed)
Pt motivated to lose weight but unable to do it without coaching.  Will benefit from nutrition consult. Referral placed and information to contact our dietitian is given.

## 2013-06-02 NOTE — Assessment & Plan Note (Signed)
ENT referral placed for f/u

## 2013-06-07 ENCOUNTER — Ambulatory Visit (INDEPENDENT_AMBULATORY_CARE_PROVIDER_SITE_OTHER): Payer: Medicaid Other | Admitting: Family Medicine

## 2013-06-07 ENCOUNTER — Encounter: Payer: Self-pay | Admitting: Family Medicine

## 2013-06-07 VITALS — BP 130/69 | HR 80 | Temp 99.3°F | Ht 64.0 in | Wt 223.0 lb

## 2013-06-07 DIAGNOSIS — L0390 Cellulitis, unspecified: Secondary | ICD-10-CM

## 2013-06-07 DIAGNOSIS — L0291 Cutaneous abscess, unspecified: Secondary | ICD-10-CM | POA: Insufficient documentation

## 2013-06-07 MED ORDER — DOXYCYCLINE HYCLATE 100 MG PO TABS: 100.0000 mg | ORAL_TABLET | Freq: Two times a day (BID) | ORAL | Status: DC

## 2013-06-07 NOTE — Assessment & Plan Note (Signed)
R posterior back/subq tissue.  Measuring 1 x 1, outlined with surgical marker.  I&D to area, explained risks and benefits and verbal consent obtained.  No fever, chills, systemic symptoms.  Will tx with doxycycline 100 mg BID x 10 days and pack the area, f/u in 2 weeks.   Incision and Drainage Procedure Note  Pre-operative Diagnosis: Abscess   Post-operative Diagnosis: same  Indications: Infection   Anesthesia: 1% plain lidocaine  Procedure Details  The procedure, risks and complications have been discussed in detail (including, but not limited to airway compromise, infection, bleeding) with the patient, and the patient has signed consent to the procedure.  The skin was sterilely prepped and draped over the affected area in the usual fashion. After adequate local anesthesia, I&D with a #11 blade was performed on the right low back. Purulent drainage: present The patient was observed until stable.  Findings: 1 x 1 cm abscess with 2 cc purulent drainage   EBL: 1 cc's  Drains: none  Condition: Tolerated procedure well  Complications: none.

## 2013-06-07 NOTE — Progress Notes (Signed)
Subjective:     Erika [name] is a 33 y.o. female who presents for evaluation of a probable cutaneous abscess. Lesion is located in the Right lower back. Onset was 5 days ago. Symptoms have gradually worsened.  Abscess has associated symptoms of none. 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 Patient does not have diabetes.  PMH: NONE The following portions of the patient's history were reviewed and updated as appropriate: allergies, current medications, past family history, past medical history, past social history, past surgical history and problem list.    Objective:    There is an area characterized by a subcutaneous mass consistent with a cutaneous abscess, induration, tenderness measuring 1 cm in greatest dimension. Location: Right lower back.  Procedure Informed consent obtained.  The area was prepped in the usual manner and the skin overlying the abscess was anesthetized with 5cc of 1% plain lidocaine.  The area was sharply incised and approx 2ccs of purulent material was obtained. Packing was inserted.    Assessment:     Cutaneous abscess.    Plan:    Apply hot compresses frequently to promote drainage. Oral antibiotics -- see med orders. I & D procedure as above. RTC in 2 weeks or PRN.

## 2013-06-15 ENCOUNTER — Encounter: Payer: Self-pay | Admitting: Gastroenterology

## 2013-06-15 ENCOUNTER — Ambulatory Visit (INDEPENDENT_AMBULATORY_CARE_PROVIDER_SITE_OTHER): Payer: Medicaid Other | Admitting: Gastroenterology

## 2013-06-15 VITALS — BP 100/72 | HR 68 | Ht 63.5 in | Wt 225.4 lb

## 2013-06-15 DIAGNOSIS — R109 Unspecified abdominal pain: Secondary | ICD-10-CM

## 2013-06-15 MED ORDER — GLYCOPYRROLATE 2 MG PO TABS
ORAL_TABLET | ORAL | Status: DC
Start: 2013-06-15 — End: 2013-08-05

## 2013-06-15 NOTE — Assessment & Plan Note (Signed)
Three-month history of abdominal pain and nausea unresponsive to Protonix.  Workup including CT and ultrasound demonstrated mild hepatic steatosis only.  Lab work has been unremarkable.  Endoscopy was negative.  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  Protonix may be exacerbating symptoms.  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  Recommendations #1 trial of anticholinergics #2 discontinue Protonix #3 patient was instructed to call back within a week to report her progress.  If not improved I will obtain a HIDA scan

## 2013-06-15 NOTE — Progress Notes (Signed)
          History of Present Illness:  The patient has returned for followup of nausea and abdominal pain.  Upper endoscopy was negative.  She was prescribed hyomax but insurance would not pay for the medication.  Symptoms continue.  She has nausea throughout the day but may be worsened postprandially.  Abdominal pain is spontaneous but also may worsen postprandially.  She was recently placed on tetracycline for an abscess,  which was drained.  She was placed on Protonix initially for pain, and was increased at her last visit to twice a day.  She's not taking NSAIDs.    Review of Systems: Pertinent positive and negative review of systems were noted in the above HPI section. All other review of systems were otherwise negative.    Current Medications, Allergies, Past Medical History, Past Surgical History, Family History and Social History were reviewed in [facility] record  Vital signs were reviewed in today's medical record. Physical Exam: General: Well developed , well nourished, no acute distress On abdominal exam there is mild tenderness in the midepigastrium to light palpation without guarding or rebound.  There are no abdominal masses or organomegaly  See Assessment and Plan under Problem List

## 2013-06-15 NOTE — Patient Instructions (Signed)
Your prescription will be sent to your pharmacy Contact the office next week if no better

## 2013-06-21 ENCOUNTER — Ambulatory Visit: Payer: Medicaid Other | Admitting: Family Medicine

## 2013-06-27 ENCOUNTER — Ambulatory Visit: Payer: Medicaid Other | Admitting: Family Medicine

## 2013-07-06 ENCOUNTER — Ambulatory Visit: Payer: Medicaid Other | Admitting: Family Medicine

## 2013-08-05 ENCOUNTER — Ambulatory Visit (INDEPENDENT_AMBULATORY_CARE_PROVIDER_SITE_OTHER): Payer: Medicaid Other | Admitting: Sports Medicine

## 2013-08-05 ENCOUNTER — Ambulatory Visit (HOSPITAL_COMMUNITY)
Admission: RE | Admit: 2013-08-05 | Discharge: 2013-08-05 | Disposition: A | Discharge status: Home / Self Care | Payer: Medicaid Other | Source: Ambulatory Visit | Attending: Family Medicine | Admitting: Family Medicine

## 2013-08-05 VITALS — BP 134/66 | HR 95

## 2013-08-05 DIAGNOSIS — R002 Palpitations: Secondary | ICD-10-CM

## 2013-08-05 DIAGNOSIS — F411 Generalized anxiety disorder: Secondary | ICD-10-CM

## 2013-08-05 DIAGNOSIS — R55 Syncope and collapse: Secondary | ICD-10-CM

## 2013-08-05 DIAGNOSIS — R42 Dizziness and giddiness: Secondary | ICD-10-CM

## 2013-08-05 LAB — CBC WITH DIFFERENTIAL/PLATELET
BASOS ABS: 0 10*3/uL (ref 0.0–0.1)
BASOS PCT: 0 % (ref 0–1)
EOS PCT: 3 % (ref 0–5)
Eosinophils Absolute: 0.2 10*3/uL (ref 0.0–0.7)
HEMATOCRIT: 36.3 % (ref 36.0–46.0)
Hemoglobin: 12.3 g/dL (ref 12.0–15.0)
Lymphocytes Relative: 37 % (ref 12–46)
Lymphs Abs: 2.6 10*3/uL (ref 0.7–4.0)
MCH: 27.1 pg (ref 26.0–34.0)
MCHC: 33.9 g/dL (ref 30.0–36.0)
MCV: 80 fL (ref 78.0–100.0)
MONO ABS: 0.6 10*3/uL (ref 0.1–1.0)
Monocytes Relative: 9 % (ref 3–12)
Neutro Abs: 3.6 10*3/uL (ref 1.7–7.7)
Neutrophils Relative %: 51 % (ref 43–77)
Platelets: 214 10*3/uL (ref 150–400)
RBC: 4.54 MIL/uL (ref 3.87–5.11)
RDW: 14.3 % (ref 11.5–15.5)
WBC: 7.1 10*3/uL (ref 4.0–10.5)

## 2013-08-05 LAB — POCT URINALYSIS DIPSTICK
Bilirubin, UA: NEGATIVE
Glucose, UA: NEGATIVE
KETONES UA: NEGATIVE
Leukocytes, UA: NEGATIVE
Nitrite, UA: NEGATIVE
PROTEIN UA: NEGATIVE
SPEC GRAV UA: 1.025
UROBILINOGEN UA: 0.2
pH, UA: 6.5

## 2013-08-05 LAB — POCT UA - MICROSCOPIC ONLY

## 2013-08-05 LAB — GLUCOSE, CAPILLARY: GLUCOSE-CAPILLARY: 135 mg/dL — AB (ref 70–99)

## 2013-08-05 MED ORDER — OXYCODONE HCL 5 MG PO TABS: 5.0000 mg | ORAL_TABLET | ORAL | Status: DC | PRN

## 2013-08-05 MED ORDER — LORAZEPAM 0.5 MG PO TABS: 0.5000 mg | ORAL_TABLET | Freq: Two times a day (BID) | ORAL | Status: DC | PRN

## 2013-08-05 NOTE — Assessment & Plan Note (Addendum)
Acute condition  - unclear etiology, given patient experienced a presyncopal episode while here while obtaining an EKG is reassuring this is noncardiac etiology.  She has no other risk factors.  She reports increased stress at work and home but does not want to divulge further and this is likely contributing.  Also consideration of substance induced condition given chronic opioid use (of note as well the person accompanying her was falling asleep during the exam but otherwise seems appropriate; although had slightly pressured speech).  After checking the [place] controlled substance database; her controlled substance filling behaviors are consistent with her prescription described today from her neurologist. She reports her last dose was 2 days ago all of her medicines. 1. Trial of Ativan 0.5 mg twice a day for likely anxiety component. 2. Basic labs including UDS and UA 3. Discussed appropriate use of controlled substances and risk for unintentional overdose if used improperly.  Patient is aware of these risks and wishes to try the above plan. > Follow up with PCP next week;  overall the situation seems quite bizarre and this may be a recurrence/exacerbation of her bipolar disorder.  Pain reassured this is noncardiac in nature but I have discussed the expected course and duration of anxiety attacks and have reviewed signs and symptoms that warrant emergent evaluation. > Patient will likely benefit from counseling and followup on safe relationships

## 2013-08-05 NOTE — Patient Instructions (Signed)
We are going to try you on a low dose of Ativan to help with her symptoms.  Please see below.  Please followup with Dr. Thomes Dinning next week.  Palpitations  A palpitation is the feeling that your heartbeat is irregular or is faster than normal. It may feel like your heart is fluttering or skipping a beat. Palpitations are usually not a serious problem. However, in some cases, you may need further medical evaluation. CAUSES  Palpitations can be caused by:  Smoking.  Caffeine or other stimulants, such as diet pills or energy drinks.  Alcohol.  Stress and anxiety.  Strenuous physical activity.  Fatigue.  Certain medicines.  Heart disease, especially if you have a history of arrhythmias. This includes atrial fibrillation, atrial flutter, or supraventricular tachycardia.  An improperly working pacemaker or defibrillator. DIAGNOSIS  To find the cause of your palpitations, your caregiver will take your history and perform a physical exam. Tests may also be done, including:  Electrocardiography (ECG). This test records the heart's electrical activity.  Cardiac monitoring. This allows your caregiver to monitor your heart rate and rhythm in real time.  Holter monitor. This is a portable device that records your heartbeat and can help diagnose heart arrhythmias. It allows your caregiver to track your heart activity for several days, if needed.  Stress tests by exercise or by giving medicine that makes the heart beat faster. TREATMENT  Treatment of palpitations depends on the cause of your symptoms and can vary greatly. Most cases of palpitations do not require any treatment other than time, relaxation, and monitoring your symptoms. Other causes, such as atrial fibrillation, atrial flutter, or supraventricular tachycardia, usually require further treatment. HOME CARE INSTRUCTIONS   Avoid:  Caffeinated coffee, tea, soft drinks, diet pills, and energy  drinks.  Chocolate.  Alcohol.  Stop smoking if you smoke.  Reduce your stress and anxiety. Things that can help you relax include:  A method that measures bodily functions so you can learn to control them (biofeedback).  Yoga.  Meditation.  Physical activity such as swimming, jogging, or walking.  Get plenty of rest and sleep. SEEK MEDICAL CARE IF:   You continue to have a fast or irregular heartbeat beyond 24 hours.  Your palpitations occur more often. SEEK IMMEDIATE MEDICAL CARE IF:  You develop chest pain or shortness of breath.  You have a severe headache.  You feel dizzy, or you faint. MAKE SURE YOU:  Understand these instructions.  Will watch your condition.  Will get help right away if you are not doing well or get worse. Document Released: 02/15/2000 Document Revised: 06/14/2012 Document Reviewed: 04/18/2011 [facility] Patient Information 2014 [place].

## 2013-08-05 NOTE — Progress Notes (Signed)
Erika [name] - 33 y.o. female MRN [id number]  Date of birth: 03-14-1980  SUBJECTIVE:  Including CC & ROS.  Chief Complaint  Patient presents with  . Palpitations   patient presents with a slightly variable stories throughout the exam but essentially he reports 2-3 month history of increased awareness of her heartbeat without tachypalpitations.  Over the past couple of days and most notably yesterday she began experiencing episodes of severe flushing and headaches associated with this.  She has had no syncopal episodes but does become slightly diaphoretic and anxious.  Some smothering of her breathing during these episodes but no difficulty moving air, no wheezing.  She reports this seems to be worse while at work and there are some associated visual symptoms but no overt blurring or amaurosis fugax.  No unilateral weakness.  No dysarthria.  She does see a neurologist for chronic headaches and has been taking oxycodone IR 3-4 times per day.  The symptoms seem slightly different than her typical migraines.  She has no associated nausea or vomiting.  No diarrhea.  No recent illness.  She does become flushed and diaphoretic but no fevers or chills.  No dysuria, frequency, hesitancy.  Chronic abdominal pain that is essentially unchanged.  Last Imitrex dose yesterday.   HISTORY: Past Medical, Surgical, Social, and Family History Reviewed & Updated per EMR. Pertinent Historical Findings include: History of bipolar disorder, dizziness and giddiness without further information, as above   On topamax; no other recent medication changes; updated med list today  PHYSICAL EXAM:  VS: BP:134/66 mmHg  HR:95bpm  TEMP: ( )  RESP:   HT:    WT:   BMI:  PHYSICAL EXAM: GENERAL:  Adult, obese Caucasian female. In no discomfort; no respiratory distress PSYCH:  alert and appropriate, good insight  HNEENT:  mmm, no JVD CARDIAC:  RRR, S1/S2 heard, no murmur LUNGS:  CTA B, no wheezes, no crackles  EXTREM:  Warm,  well perfused.  Moves all 4 extremities spontaneously; no lateralization.   PSYCH Exam: Eye Contact:   Poor Speech:  Labored and choppy Mood:  depressed  Affect:   Withdrawn and anxious SI/HI:  None Orientation:    Knowledge:    Average Thought Proc:  Slowed Thought Cont:   Judgement:  Impaired Insight:  Impaired ---- Psychomotor: Depression Akathisia:  None  NEURO Exam: Gross Deficits:  None Speech:  Labored and choppy  Gait:  Normal Cerebellar:   Normal FNF & RAM CN:  2 through 12 intact, funduscopic exam limited but no papilledema or AV nicking UE Myo/Sen:  The bilateral upper extremity myotomes are 5+/5.  Sensation is grossly intact to light sensation in bilateral upper extremity dermatomes.  LE Myo/Sen:    The bilateral Lower Extremity myotomes are 5+/5.  Sensation is grossly intact to light sensation in bilateral Lower Extremity dermatomes.  Reflexes:   Upper and lower DTRs 2+ out of 4, absent pectoral and suprapatellar reflex  EKG: 08/05/13 - Patient symptomatic during the time EKG obtained. Rate & Rhythm: 95 NSR Axis:   Normal Intervals:  Normal Other:   No ST changes; no ischemia  ASSESSMENT & PLAN: See problem based charting & AVS for pt instructions.  >50% of this 25 minute visit spent in direct patient counseling and/or coordination of care.

## 2013-08-05 NOTE — Progress Notes (Signed)
Del

## 2013-08-06 LAB — DRUG SCREEN, URINE
AMPHETAMINE SCRN UR: NEGATIVE
Barbiturate Quant, Ur: NEGATIVE
Benzodiazepines.: NEGATIVE
Cocaine Metabolites: NEGATIVE
Creatinine,U: 124.82 mg/dL
MARIJUANA METABOLITE: NEGATIVE
Methadone: NEGATIVE
OPIATES: NEGATIVE
PROPOXYPHENE: NEGATIVE
Phencyclidine (PCP): NEGATIVE

## 2013-08-06 LAB — COMPREHENSIVE METABOLIC PANEL
ALT: 19 U/L (ref 0–35)
AST: 17 U/L (ref 0–37)
Albumin: 4 g/dL (ref 3.5–5.2)
Alkaline Phosphatase: 52 U/L (ref 39–117)
BILIRUBIN TOTAL: 0.4 mg/dL (ref 0.2–1.2)
BUN: 14 mg/dL (ref 6–23)
CALCIUM: 9.1 mg/dL (ref 8.4–10.5)
CHLORIDE: 106 meq/L (ref 96–112)
CO2: 26 mEq/L (ref 19–32)
CREATININE: 0.56 mg/dL (ref 0.50–1.10)
Glucose, Bld: 94 mg/dL (ref 70–99)
Potassium: 3.8 mEq/L (ref 3.5–5.3)
Sodium: 137 mEq/L (ref 135–145)
Total Protein: 6.8 g/dL (ref 6.0–8.3)

## 2013-08-08 ENCOUNTER — Encounter: Payer: Self-pay | Admitting: Family Medicine

## 2013-08-08 ENCOUNTER — Ambulatory Visit (INDEPENDENT_AMBULATORY_CARE_PROVIDER_SITE_OTHER): Payer: Medicaid Other | Admitting: Family Medicine

## 2013-08-08 VITALS — BP 122/73 | HR 87 | Temp 98.9°F | Ht 64.0 in | Wt 222.0 lb

## 2013-08-08 DIAGNOSIS — F319 Bipolar disorder, unspecified: Secondary | ICD-10-CM

## 2013-08-08 DIAGNOSIS — R002 Palpitations: Secondary | ICD-10-CM

## 2013-08-09 ENCOUNTER — Telehealth: Payer: Self-pay | Admitting: Family Medicine

## 2013-08-09 NOTE — Progress Notes (Signed)
Family Medicine Office Visit Note   Subjective:   Patient ID: Erika [name], female  DOB: Jun 04, 1980, 33 y.o.. MRN: [id number]   Pt that comes today for follow up recent episodes of palpitations. She was seen here 3 days ago and EKG was normal, lab work was ordered, a trial of Ativan given and recommended to f/u today. She reports has been feeling better. Denies other episodes since she started to take Lorazepam PRN (mostly takes one at night). Pt has been diagnosed with Bipolar Disorder and not taking meds for this since 2012. She reports this symptoms are somehow different of what she experiences when her "bipolar is acting out"; although reports not been able to sleep well and having racing thoughts. During the day she works in a [employer] and states get very anxious because has to "move fast" in order to get her job done and this stress her significantly. No other stressors identified today.  Review of Systems:  Pt denies SOB, chest pain, headaches, dizziness, numbness or weakness. No changes on urinary or BM habits. No unintentional weigh loss/gain.  Objective:   Physical Exam: Gen:  Obese, NAD HEENT: Moist mucous membranes  CV: Regular rate and rhythm, no murmurs rubs or gallops PULM: Clear to auscultation bilaterally. No wheezes/rales/rhonchi ABD: Soft, non tender, non distended, normal bowel sounds EXT: No edema Neuro: Alert and oriented x3. No focalization Psych: flat affect. Normal speech. Normal though process. No hallucinations/ delusions. No agitation. No suicidal ideation/plan  Assessment & Plan:

## 2013-08-09 NOTE — Patient Instructions (Signed)
You need more in depth evaluation and treatment  of your Bipolar disorder as this may play a big role in your current symptoms. [facility] has evaluated you in the past, please consider this a resource to help you with your mental condition.  I have placed a future order for TSH (Thyroid test) if you continue wit symptoms please make a lab appointment and then f/u with [place].

## 2013-08-09 NOTE — Assessment & Plan Note (Addendum)
Declines referral to [facility] where she has seen in the past. The fact that Lorazepam is helping may indicate decompensation of her bipolar over other causes of palpitations. Lab work is normal including UDS (negative) in a pt that is on Chronic Opioids and reported taking it the night before her urine collection. Something important to follow up. We discussed possible causes and one lab was not done and would be neccessary for diagnosis is TSH. Will order as future and if events repeat pt is instructed to come for lab work.   Strongly recommend that she needs to get seen for her Bipolar Disorder as more definitive therapy for her is required.

## 2013-08-09 NOTE — Telephone Encounter (Signed)
Pt c/o of fluttering like sensations to heart/want provider to contact her  asap to talk with her about this.

## 2013-08-10 NOTE — Telephone Encounter (Signed)
I spoke with patient late afternoon 6/10 and she was informing Dr. Thomes Dinning about her 3 heart flutters that she experienced that were not significant but enough to inform her. Patient state that she was told by Dr.Piloto to let her know if this has happened again. Patient did state to me that if it did get extremely bad she would gog ot the ED

## 2013-08-13 ENCOUNTER — Other Ambulatory Visit: Payer: Self-pay | Admitting: Family Medicine

## 2013-08-13 DIAGNOSIS — R002 Palpitations: Secondary | ICD-10-CM

## 2013-08-13 NOTE — Telephone Encounter (Signed)
Already discussed with pt. She needs to come for TSH that was already ordered and I will place Cardiology consult. Please call and inform pt. Thank you

## 2013-08-15 NOTE — Telephone Encounter (Signed)
Pt informed

## 2013-08-17 ENCOUNTER — Other Ambulatory Visit: Payer: Medicaid Other

## 2013-08-17 DIAGNOSIS — R002 Palpitations: Secondary | ICD-10-CM

## 2013-08-17 DIAGNOSIS — F319 Bipolar disorder, unspecified: Secondary | ICD-10-CM

## 2013-08-17 NOTE — Progress Notes (Signed)
TSH DONE TODAY Erika [name] 

## 2013-08-18 LAB — TSH: TSH: 5.003 u[IU]/mL — ABNORMAL HIGH (ref 0.350–4.500)

## 2013-08-29 ENCOUNTER — Encounter: Payer: Self-pay | Admitting: Family Medicine

## 2013-08-29 ENCOUNTER — Other Ambulatory Visit (HOSPITAL_COMMUNITY)
Admission: RE | Admit: 2013-08-29 | Discharge: 2013-08-29 | Disposition: A | Discharge status: Home / Self Care | Payer: Medicaid Other | Source: Ambulatory Visit | Attending: Family Medicine | Admitting: Family Medicine

## 2013-08-29 ENCOUNTER — Ambulatory Visit (INDEPENDENT_AMBULATORY_CARE_PROVIDER_SITE_OTHER): Payer: Medicaid Other | Admitting: Family Medicine

## 2013-08-29 VITALS — BP 115/62 | HR 79 | Temp 99.1°F | Ht 64.0 in | Wt 221.0 lb

## 2013-08-29 DIAGNOSIS — R7989 Other specified abnormal findings of blood chemistry: Secondary | ICD-10-CM | POA: Insufficient documentation

## 2013-08-29 DIAGNOSIS — Z01419 Encounter for gynecological examination (general) (routine) without abnormal findings: Secondary | ICD-10-CM | POA: Insufficient documentation

## 2013-08-29 DIAGNOSIS — R946 Abnormal results of thyroid function studies: Secondary | ICD-10-CM

## 2013-08-29 DIAGNOSIS — Z1151 Encounter for screening for human papillomavirus (HPV): Secondary | ICD-10-CM | POA: Insufficient documentation

## 2013-08-29 DIAGNOSIS — R002 Palpitations: Secondary | ICD-10-CM

## 2013-08-29 DIAGNOSIS — Z124 Encounter for screening for malignant neoplasm of cervix: Secondary | ICD-10-CM

## 2013-08-29 NOTE — Patient Instructions (Signed)
It has been a pleasure to see you today as it has been since we met! Please make your next appointment next week to meet your new doctor and discuss results of labs drawn today

## 2013-08-29 NOTE — Assessment & Plan Note (Signed)
Will obtain repeated TSH and Free T4 today and f/u to meet new PCP and discuss results.

## 2013-08-29 NOTE — Progress Notes (Signed)
Family Medicine Office Visit Note   Subjective:   Patient ID: Erika [name], female  DOB: 07/11/1980, 33 y.o.. MRN: [id number]   Pt that comes today for f/u palpitations. She reports continue to have them, but no as frequent. Reports being tired and low energy but denies sad mood, suicidal ideation or plans. Sleeping has improved. She also has noticed hair loss. Denies cold/healt intolerance or other systemic symptoms.   Pt also requests pap smear done today before she meets her new PCP. She is at the end of her menstrual cycle and denies abnormal vaginal discharge or abdominal/pelvic pain    Review of Systems:  Pt denies fever, chills, nausea, vomiting, SOB, chest pain, headaches, dizziness, numbness or weakness. No changes on urinary or BM habits. No unintentional weigh loss/gain.  Objective:   Physical Exam: Gen:  NAD HEENT: Moist mucous membranes  CV: Regular rate and rhythm, no murmurs rubs or gallops PULM: Clear to auscultation bilaterally. No wheezes/rales/rhonchi ABD: Soft, non tender, non distended, normal bowel sounds EXT: No edema Neuro: Alert and oriented x3. No focalization Vulva and perianal area: Normal  Speculum: Vagina and cervix of normal appearance, no friability, scant blood in vagina from menses.  Bimanual exam: Uterus normal size, no adnexal masses. No cervical motion tenderness.  Assessment & Plan:

## 2013-08-30 LAB — CYTOLOGY - PAP

## 2013-08-30 LAB — TSH: TSH: 4.893 u[IU]/mL — ABNORMAL HIGH (ref 0.350–4.500)

## 2013-08-30 LAB — T4, FREE: FREE T4: 0.92 ng/dL (ref 0.80–1.80)

## 2013-09-07 ENCOUNTER — Encounter: Payer: Self-pay | Admitting: Family Medicine

## 2013-09-07 ENCOUNTER — Ambulatory Visit (INDEPENDENT_AMBULATORY_CARE_PROVIDER_SITE_OTHER): Payer: Medicaid Other | Admitting: Family Medicine

## 2013-09-07 VITALS — BP 102/53 | HR 93 | Temp 98.0°F | Ht 64.0 in | Wt 226.0 lb

## 2013-09-07 DIAGNOSIS — R7989 Other specified abnormal findings of blood chemistry: Secondary | ICD-10-CM

## 2013-09-07 DIAGNOSIS — R946 Abnormal results of thyroid function studies: Secondary | ICD-10-CM

## 2013-09-07 MED ORDER — LEVOTHYROXINE SODIUM 25 MCG PO TABS: 25.0000 ug | ORAL_TABLET | Freq: Every day | ORAL | Status: DC

## 2013-09-07 NOTE — Progress Notes (Signed)
Subjective:    Patient ID: Erika [name], female    DOB: December 11, 1980, 33 y.o.   MRN: [id number]  HPI 33 year old female presents for follow up regarding recent abnormal thyroid studies.  1) Elevated TSH - Patient recently seen for palpitations. - TSH was obtained as workup.   - TSH was found to be elevated at 5.003.  Repeat TSH w/ FT4 - 4.893/0.92 - Patient endorses fatigue and weight gain. - No other symptoms: cold intolerance, muscle weakness, constipation, etc.  Review of Systems Per HPI    Objective:   Physical Exam Filed Vitals:   09/07/13 1439  BP: 102/53  Pulse: 93  Temp: 98 F (36.7 C)   Exam: General: well appearing, NAD. Neck: No palpable thyromegaly or thyroid nodules. Cardiovascular: RRR. No murmurs, rubs, or gallops. Respiratory: CTAB. No rales, rhonchi, or wheeze. Extremities: No LE edema. Skin: Warm, dry, intact.    Assessment & [name]:  See Problem List

## 2013-09-07 NOTE — Assessment & Plan Note (Signed)
Thyroid studies consistent w/ subclinical hypothyroidism. Patient does have some symptoms suggestive of hypothyroidism.  After much discussion w/ patient (and Attending Dr. Nori Riis), given symptoms will try a trial of low dose synthroid. F/U Lab visit in 6 weeks.

## 2013-09-21 ENCOUNTER — Ambulatory Visit: Payer: Medicaid Other | Admitting: Internal Medicine

## 2013-10-06 ENCOUNTER — Encounter: Payer: Self-pay | Admitting: Family Medicine

## 2013-10-06 ENCOUNTER — Ambulatory Visit (INDEPENDENT_AMBULATORY_CARE_PROVIDER_SITE_OTHER): Payer: Medicaid Other | Admitting: Family Medicine

## 2013-10-06 VITALS — BP 112/77 | HR 93 | Temp 98.0°F | Wt 223.0 lb

## 2013-10-06 DIAGNOSIS — L02219 Cutaneous abscess of trunk, unspecified: Secondary | ICD-10-CM

## 2013-10-06 DIAGNOSIS — L03319 Cellulitis of trunk, unspecified: Secondary | ICD-10-CM

## 2013-10-06 DIAGNOSIS — L02212 Cutaneous abscess of back [any part, except buttock]: Secondary | ICD-10-CM

## 2013-10-06 MED ORDER — DOXYCYCLINE HYCLATE 100 MG PO TABS: 100.0000 mg | ORAL_TABLET | Freq: Two times a day (BID) | ORAL | Status: DC

## 2013-10-06 NOTE — Progress Notes (Signed)
Subjective:    Patient ID: Erika [name], female    DOB: 1980/10/29, 33 y.o.   MRN: [id number]  HPI She presents to same-day clinic.  Abscess right scapular area of back: Patient has a 3-4 mm area of prior abscess in her right scapular region. She states the area presented approximately 3 days ago and was very painful. Her significant other attempted to bust and drain the abscess. He states he was able to get a large amount of white thick fluid. They then placed a potato over affected area, to take out the swelling. Patient denies fevers, chills, nausea, vomiting or diarrhea. She states the area feels like it is bruised. She has a questionable history of prior MRSA infection. No wound culture result was available in the system. Patient had recent I&D of the abdomen.   Review of Systems Per history of present illness    Objective:   Physical Exam BP 112/77  Pulse 93  Temp(Src) 98 F (36.7 C) (Oral)  Wt 223 lb (101.152 kg) Gen: Pleasant, Caucasian female, well-developed, well-nourished. No acute distress, nontoxic in appearance Muscle skeletal: Small 3-4 mm healing abscess. Mild erythema around lesion area. No drainage noted. No ecchymosis. Tender to palpation. No rash.    Assessment & [name]:

## 2013-10-06 NOTE — Patient Instructions (Signed)
MRSA Infection MRSA stands for methicillin-resistant Staphylococcus aureus. This type of infection is caused by Staphylococcus aureus bacteria that are no longer affected by the medicines used to kill them (drug resistant). Staphylococcus (staph) bacteria are normally found on the skin or in the nose of healthy people. In most cases, these bacteria do not cause infection. But if these resistant bacteria enter your body through a cut or sore, they can cause a serious infection on your skin or in other parts of your body. There is a slight chance that the staph on your skin or in your nose is MRSA. There are two types of MRSA infections:  Hospital-acquired MRSA is bacteria that you get in the hospital.  Community-acquired MRSA is bacteria that you get somewhere other than in a hospital. RISK FACTORS Hospital-acquired MRSA is more common. You could be at risk for this infection if you are in the hospital and you:  Have surgery or a procedure.  Have an IV access or a catheter tube placed in your body.  Have weak resistance to germs (weakened immune system).  Are elderly.  Are on kidney dialysis. You could be at risk for community-acquired MRSA if you have a break in your skin and come into contact with MRSA. This may happen if you:  Play sports where there is skin-to-skin contact.  Live in a crowded setting, like a dormitory or a military barracks.  Share towels, razors, or sports equipment with other people. SYMPTOMS  Symptoms of hospital-acquired MRSA depend on where MRSA has spread. Symptoms may include:  Wound infection.  Skin infection.  Rash.  Pneumonia.  Fever and chills.  Difficulty breathing.  Chest pain. Community-acquired MRSA is most likely to start as a scratch or cut that becomes infected. Symptoms may include:  A pus-filled pimple.  A boil on your skin.  Pus draining from your skin.  A sore (abscess) under your skin or somewhere in your body.  Fever  with or without chills. DIAGNOSIS  The diagnosis of MRSA is made by taking a sample from an infected area and sending it to a lab for testing. A lab technician can grow (culture) MRSA and check it under a microscope. The cultured MRSA can be tested to see which type of antibiotic medicine will work to treat it. Newer tests can identify MRSA more quickly by testing bacteria samples for MRSA genes. Your health care provider can diagnose MRSA using samples from:   Cuts or wounds in infected areas.  Nasal swabs.  Saliva or cough specimens from deep in the lungs (sputum).  Urine.  Blood. You may also have:  Imaging studies (such as X-ray or MRI) to check if the infection has spread to the lungs, bones, or joints.  A culture and sensitivity test of blood or fluids from inside the joints. TREATMENT  Treatment depends on how severe, deep, or extensive the infection is. Very bad infections may require a hospital stay.  Some skin infections, such as a small boil or sore (abscess), may be treated by draining pus from the site of the infection.  More extensive surgery to drain pus may be necessary for deeper or more widespread soft tissue infections.  You may then have to take antibiotic medicine given by mouth or through a vein. You may start antibiotic treatment right away or after testing can be done to see what antibiotic medicine should be used. HOME CARE INSTRUCTIONS   Take your antibiotics as directed by your health care provider. Take   the medicine as prescribed until it is finished.  Avoid close contact with those around you as much as possible. Do not use towels, razors, toothbrushes, bedding, or other items that will be used by others.  Wash your hands frequently for 15 seconds with soap and water. Dry your hands with a clean or disposable towel.  When you are not able to wash your hands, use hand sanitizer that is more than 60 percent alcohol.  Wash towels, sheets, or clothes in  the washing machine with detergent and hot water. Dry them in a hot dryer.  Follow your health care provider's instructions for wound care. Wash your hands before and after changing your bandages.  Always shower after exercising.  Keep all cuts and scrapes clean and covered with a bandage.  Be sure to tell all your health care providers that you have MRSA so they are aware of your infection. SEEK MEDICAL CARE IF:  You have a cut, scrape, pimple, or boil that becomes red, swollen, or painful or has pus in it.  You have pus draining from your skin.  You have an abscess under your skin or somewhere in your body. SEEK IMMEDIATE MEDICAL CARE IF:   You have symptoms of a skin infection with a fever or chills.  You have trouble breathing.  You have chest pain.  You have a skin wound and you become nauseous or start vomiting. MAKE SURE YOU:  Understand these instructions.  Will watch your condition.  Will get help right away if you are not doing well or get worse. Document Released: 02/17/2005 Document Revised: 02/22/2013 Document Reviewed: 12/10/2012 [facility] Patient Information 2015 [place], [place]. This information is not intended to replace advice given to you by your health care provider. Make sure you discuss any questions you have with your health care provider.  I do not see where your prior I&D was cultured. I plan to treat you with the same medication called doxycycline for you to take twice a day for 10 days. Watch for any signs of fevers, chills or increase in redness or swelling or additional drainage of the area. Do not squeeze or try to train area in your abdomen. Take Tylenol or ibuprofen for pain.

## 2013-10-06 NOTE — Assessment & Plan Note (Signed)
Patient was prior treated with doxycycline for recent abscess of the abdomen with good results. Do not see culture result from drainage. Area is small and does not appear to be drainable today, more of a cellulitic reaction. Will prescribe doxycycline twice a day today. Patient was educated on red flags. Followup in 2 weeks or sooner if needed.

## 2013-10-09 ENCOUNTER — Encounter (HOSPITAL_BASED_OUTPATIENT_CLINIC_OR_DEPARTMENT_OTHER): Payer: Self-pay | Admitting: Emergency Medicine

## 2013-10-09 ENCOUNTER — Emergency Department (HOSPITAL_BASED_OUTPATIENT_CLINIC_OR_DEPARTMENT_OTHER)
Admission: EM | Admit: 2013-10-09 | Discharge: 2013-10-09 | Disposition: A | Discharge status: Home / Self Care | Payer: Medicaid Other | Attending: Emergency Medicine | Admitting: Emergency Medicine

## 2013-10-09 DIAGNOSIS — IMO0002 Reserved for concepts with insufficient information to code with codable children: Secondary | ICD-10-CM | POA: Insufficient documentation

## 2013-10-09 DIAGNOSIS — Z87891 Personal history of nicotine dependence: Secondary | ICD-10-CM | POA: Insufficient documentation

## 2013-10-09 DIAGNOSIS — G43909 Migraine, unspecified, not intractable, without status migrainosus: Secondary | ICD-10-CM | POA: Diagnosis not present

## 2013-10-09 DIAGNOSIS — Z8659 Personal history of other mental and behavioral disorders: Secondary | ICD-10-CM | POA: Diagnosis not present

## 2013-10-09 DIAGNOSIS — R6883 Chills (without fever): Secondary | ICD-10-CM | POA: Diagnosis not present

## 2013-10-09 DIAGNOSIS — Z87440 Personal history of urinary (tract) infections: Secondary | ICD-10-CM | POA: Diagnosis not present

## 2013-10-09 DIAGNOSIS — Z3202 Encounter for pregnancy test, result negative: Secondary | ICD-10-CM | POA: Insufficient documentation

## 2013-10-09 DIAGNOSIS — Z8739 Personal history of other diseases of the musculoskeletal system and connective tissue: Secondary | ICD-10-CM | POA: Insufficient documentation

## 2013-10-09 DIAGNOSIS — Z792 Long term (current) use of antibiotics: Secondary | ICD-10-CM | POA: Diagnosis not present

## 2013-10-09 DIAGNOSIS — R197 Diarrhea, unspecified: Secondary | ICD-10-CM | POA: Insufficient documentation

## 2013-10-09 DIAGNOSIS — R1032 Left lower quadrant pain: Secondary | ICD-10-CM | POA: Insufficient documentation

## 2013-10-09 DIAGNOSIS — E669 Obesity, unspecified: Secondary | ICD-10-CM | POA: Insufficient documentation

## 2013-10-09 DIAGNOSIS — Z79899 Other long term (current) drug therapy: Secondary | ICD-10-CM | POA: Insufficient documentation

## 2013-10-09 DIAGNOSIS — R112 Nausea with vomiting, unspecified: Secondary | ICD-10-CM

## 2013-10-09 LAB — URINALYSIS, ROUTINE W REFLEX MICROSCOPIC
Bilirubin Urine: NEGATIVE
GLUCOSE, UA: NEGATIVE mg/dL
Hgb urine dipstick: NEGATIVE
Ketones, ur: NEGATIVE mg/dL
LEUKOCYTES UA: NEGATIVE
Nitrite: NEGATIVE
Protein, ur: NEGATIVE mg/dL
Specific Gravity, Urine: 1.004 — ABNORMAL LOW (ref 1.005–1.030)
UROBILINOGEN UA: 0.2 mg/dL (ref 0.0–1.0)
pH: 6 (ref 5.0–8.0)

## 2013-10-09 LAB — COMPREHENSIVE METABOLIC PANEL
ALK PHOS: 53 U/L (ref 39–117)
ALT: 24 U/L (ref 0–35)
ANION GAP: 13 (ref 5–15)
AST: 22 U/L (ref 0–37)
Albumin: 3.9 g/dL (ref 3.5–5.2)
BUN: 15 mg/dL (ref 6–23)
CALCIUM: 9.9 mg/dL (ref 8.4–10.5)
CO2: 23 mEq/L (ref 19–32)
Chloride: 104 mEq/L (ref 96–112)
Creatinine, Ser: 0.7 mg/dL (ref 0.50–1.10)
GFR calc Af Amer: >90 mL/min (ref >90)
GFR calc non Af Amer: >90 mL/min (ref >90)
Glucose, Bld: 103 mg/dL — ABNORMAL HIGH (ref 70–99)
POTASSIUM: 4.3 meq/L (ref 3.7–5.3)
Sodium: 140 mEq/L (ref 137–147)
TOTAL PROTEIN: 7.7 g/dL (ref 6.0–8.3)
Total Bilirubin: 0.3 mg/dL (ref 0.3–1.2)

## 2013-10-09 LAB — PREGNANCY, URINE: PREG TEST UR: NEGATIVE

## 2013-10-09 LAB — CBC
HCT: 39.5 % (ref 36.0–46.0)
Hemoglobin: 13.2 g/dL (ref 12.0–15.0)
MCH: 27.3 pg (ref 26.0–34.0)
MCHC: 33.4 g/dL (ref 30.0–36.0)
MCV: 81.6 fL (ref 78.0–100.0)
Platelets: 182 10*3/uL (ref 150–400)
RBC: 4.84 MIL/uL (ref 3.87–5.11)
RDW: 13.2 % (ref 11.5–15.5)
WBC: 6.5 10*3/uL (ref 4.0–10.5)

## 2013-10-09 LAB — LIPASE, BLOOD: Lipase: 20 U/L (ref 11–59)

## 2013-10-09 MED ORDER — FENTANYL CITRATE 0.05 MG/ML IJ SOLN
50.0000 ug | Freq: Once | INTRAMUSCULAR | Status: AC
  Administered 2013-10-09: 50 ug via INTRAVENOUS
  Filled 2013-10-09: qty 2

## 2013-10-09 MED ORDER — SODIUM CHLORIDE 0.9 % IV BOLUS (SEPSIS)
1000.0000 mL | Freq: Once | INTRAVENOUS | Status: AC
  Administered 2013-10-09: 1000 mL via INTRAVENOUS

## 2013-10-09 MED ORDER — ONDANSETRON HCL 4 MG/2ML IJ SOLN
4.0000 mg | Freq: Once | INTRAMUSCULAR | Status: AC
Start: 2013-10-09 — End: 2013-10-09
  Administered 2013-10-09: 4 mg via INTRAVENOUS
  Filled 2013-10-09: qty 2

## 2013-10-09 MED ORDER — PROMETHAZINE HCL 25 MG/ML IJ SOLN
25.0000 mg | Freq: Once | INTRAMUSCULAR | Status: AC
  Administered 2013-10-09: 25 mg via INTRAVENOUS
  Filled 2013-10-09: qty 1

## 2013-10-09 NOTE — Discharge Instructions (Signed)

## 2013-10-09 NOTE — ED Notes (Addendum)
Pt states diarrhea since Friday. States vomiting started this morning. C/o cramping to abd with diarrhea. C/o chills. Fevers unknown. States she feels generally weak. Took a dose of phenergan 25mg  po at midnight without relief.

## 2013-10-09 NOTE — ED Provider Notes (Signed)
CSN: [id number]     Arrival date & time 10/09/13  0624 History   First MD Initiated Contact with Patient 10/09/13 [phone]     Chief Complaint  Patient presents with  . n/v/d      (Consider location/radiation/quality/duration/timing/severity/associated sxs/prior Treatment) Patient is a 33 y.o. female presenting with diarrhea. The history is provided by the patient.  Diarrhea Quality:  Watery Severity:  Moderate Onset quality:  Gradual Duration:  2 days Timing:  Constant Progression:  Worsening Relieved by:  Nothing Worsened by:  Nothing tried Associated symptoms: abdominal pain (crampy, just before having a bowel movement), chills and vomiting (for 5 minutes this morning)   Associated symptoms: no fever and no URI   Risk factors: recent antibiotic use (on doxycycline for MRSA abscess)     Past Medical History  Diagnosis Date  . Migraine   . Back pain   . Arthritis   . Depression   . Obesity   . UTI (urinary tract infection)    Past Surgical History  Procedure Laterality Date  . Cesarean section  08/20/2005  . Tubal ligation    . 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 Tonsillectomy    . Adenoidectomy    . Nasal sinus surgery    . Mouth surgery    . Incision and drainage abscess Right     with packing on right side   Family History  Problem Relation Age of Onset  . Cancer Maternal Grandmother     breast ca, cervical  . Cancer Paternal Grandmother     breast ca  . Alcohol abuse Father   . Colon cancer Neg Hx   . Breast cancer      pat gm's sister   History  Substance Use Topics  . Smoking status: Former Smoker -- 0.50 packs/day    Quit date: 03/03/2010  . Smokeless tobacco: Never Used  . Alcohol Use: Yes     Comment: rare   OB History   Grav Para Term Preterm Abortions TAB SAB Ect Mult Living                 Review of Systems  Constitutional: Positive for chills. Negative for fever.  Gastrointestinal: Positive for vomiting (for 5  minutes this morning), abdominal pain (crampy, just before having a bowel movement) and diarrhea.  All other systems reviewed and are negative.     Allergies  Review of patient's allergies indicates no known allergies.  Home Medications   Prior to Admission medications   Medication Sig Start Date End Date Taking? Authorizing Provider  cyclobenzaprine (FLEXERIL) 10 MG tablet Take 10 mg by mouth 3 (three) times daily as needed for muscle spasms.    Historical Provider, MD  doxycycline (VIBRA-TABS) 100 MG tablet Take 1 tablet (100 mg total) by mouth 2 (two) times daily. 10/06/13   Renee A Kuneff, DO  fluticasone (FLONASE) 50 MCG/ACT nasal spray PLACE 2 SPRAYS INTO THE NOSE DAILY.    Dayarmys Piloto de Gwendalyn Ege, MD  levothyroxine (LEVOTHROID) 25 MCG tablet Take 1 tablet (25 mcg total) by mouth daily before breakfast. 09/07/13   Coral Spikes, DO  LORazepam (ATIVAN) 0.5 MG tablet Take 1 tablet (0.5 mg total) by mouth 2 (two) times daily as needed for anxiety. 08/05/13   Gerda Diss, DO  oxyCODONE (OXY IR/ROXICODONE) 5 MG immediate release tablet Take 1 tablet (5 mg total) by mouth every 4 (four) hours as  needed for severe pain. 08/05/13   Gerda Diss, DO  promethazine (PHENERGAN) 25 MG tablet Take 25 mg by mouth as needed for nausea or vomiting.    Historical Provider, MD  SUMAtriptan (IMITREX) 50 MG tablet Take 50 mg by mouth every 2 (two) hours as needed for migraine or headache. 1 pill at start of migraine symptoms. If no improvement 2 pills 2 hours later. 11/20/11   Donnamae Jude, MD  topiramate (TOPAMAX) 25 MG tablet Take 25 mg by mouth daily as needed.    Historical Provider, MD   BP 121/51  Pulse 81  Temp(Src) 98.2 F (36.8 C) (Oral)  Resp 18  SpO2 100%  LMP 09/18/2013 Physical Exam  Nursing note and vitals reviewed. Constitutional: She is oriented to person, place, and time. She appears well-developed and well-nourished. No distress.  HENT:  Head: Normocephalic and atraumatic.   Eyes: EOM are normal. Pupils are equal, round, and reactive to light.  Neck: Normal range of motion. Neck supple.  Cardiovascular: Normal rate and regular rhythm.  Exam reveals no friction rub.   No murmur heard. Pulmonary/Chest: Effort normal and breath sounds normal. No respiratory distress. She has no wheezes. She has no rales.  Abdominal: Soft. She exhibits no distension. There is tenderness (mild, lower). There is no rebound.  Musculoskeletal: Normal range of motion. She exhibits no edema.  Neurological: She is alert and oriented to person, place, and time.  Skin: She is not diaphoretic.    ED Course  Procedures (including critical care time) Labs Review Labs Reviewed  URINALYSIS, ROUTINE W REFLEX MICROSCOPIC  PREGNANCY, URINE  CBC  COMPREHENSIVE METABOLIC PANEL  LIPASE, BLOOD    Imaging Review No results found.   EKG Interpretation None      MDM   Final diagnoses:  Nausea vomiting and diarrhea    46F here with diarrhea for 2 days, vomiting began this morning. No fevers, but having chills. Crampy abdominal pain only prior to bowel movements. No blood in either vomit or diarrhea. Patient with mild abdominal pain on exam in lower abdomen. Normal vitals, MMM. Will check labs, give fluids, anti-emetics. Labs ok. Sleeping with fentanyl and phenergan. On re-exam, states she feels better. Has zofran and phenergan at home. Stable for discharge.  Evelina Bucy, MD 10/09/13 [phone]

## 2013-10-11 ENCOUNTER — Ambulatory Visit (INDEPENDENT_AMBULATORY_CARE_PROVIDER_SITE_OTHER): Payer: Medicaid Other | Admitting: Family Medicine

## 2013-10-11 ENCOUNTER — Encounter: Payer: Self-pay | Admitting: Family Medicine

## 2013-10-11 VITALS — BP 126/71 | HR 90 | Temp 98.4°F | Ht 64.0 in | Wt 220.2 lb

## 2013-10-11 DIAGNOSIS — A084 Viral intestinal infection, unspecified: Secondary | ICD-10-CM | POA: Insufficient documentation

## 2013-10-11 DIAGNOSIS — A088 Other specified intestinal infections: Secondary | ICD-10-CM

## 2013-10-11 DIAGNOSIS — L03319 Cellulitis of trunk, unspecified: Secondary | ICD-10-CM

## 2013-10-11 DIAGNOSIS — L02219 Cutaneous abscess of trunk, unspecified: Secondary | ICD-10-CM

## 2013-10-11 DIAGNOSIS — L02212 Cutaneous abscess of back [any part, except buttock]: Secondary | ICD-10-CM

## 2013-10-11 MED ORDER — ONDANSETRON HCL 4 MG PO TABS: 4.0000 mg | ORAL_TABLET | Freq: Three times a day (TID) | ORAL | Status: DC | PRN

## 2013-10-11 NOTE — Progress Notes (Signed)
Subjective:    Patient ID: Erika [name], female    DOB: December 01, 1980, 33 y.o.   MRN: [id number]  HPI [name] is a 33 y.o. female presents to same-day clinic  Diarrhea: Patient states she's had diarrhea since last Friday (5 days). Patient was started on doxycycline for MRSA abscess of the back on Thursday. Patient states that she's had nonbloody yellow watery diarrhea since Friday. Her stomach is feeling very crampy. She denies fever, but endorses chills. She was seen in the ED over the weekend and was getting IV hydration. Her daughter also has diarrhea. She was given a prescription for Phenergan, but is unable to continue taking Phenergan. She has been unable to hold down food, nauseated to liquids. Patient endorses fatigue.  Back abscess: Right back up to about scapula is healed per patient.  Review of Systems Per history of present illness    Objective:   Physical Exam BP 126/71  Pulse 90  Temp(Src) 98.4 F (36.9 C) (Oral)  Ht 5\' 4"  (1.626 m)  Wt 220 lb 3.2 oz (99.882 kg)  BMI 37.78 kg/m2  LMP 09/18/2013 Gen: Pleasant, cooperative Caucasian female. Appears fatigued. Well-developed, well-nourished nontoxic in appearance, no acute distress. HEENT: AT. [place].  Bilateral eyes without injections or icterus. MMM.  CV: RRR, no murmurs Chest: CTAB, no wheeze or crackles Abd: Soft.NTND. BS present. No Masses palpated.  Ext: No erythema. No edema.  Skin: No rashes, purpura or petechiae. Abscess of right scapula completely healed.      Assessment & [name]:

## 2013-10-11 NOTE — Assessment & Plan Note (Addendum)
Affected area completely healed today. Discontinued antibiotics injury to gastric upset but can be caused by antibiotic use versus viral gastroenteritis versus C. difficile infection. Patient to followup in one week if diarrhea has not resolved, we'll consider C. difficile PCR at that time. Likely viral gastroenteritis related (patient's daughter has diarrhea as well), advised patient to remain hydrated, Zofran for nausea prescribed. AVS on viral gastroenteritis given Work excuse provided today for absence August 11 and 12th.

## 2013-10-11 NOTE — Patient Instructions (Signed)
Viral Gastroenteritis Viral gastroenteritis is also known as stomach flu. This condition affects the stomach and intestinal tract. It can cause sudden diarrhea and vomiting. The illness typically lasts 3 to 8 days. Most people develop an immune response that eventually gets rid of the virus. While this natural response develops, the virus can make you quite ill. CAUSES  Many different viruses can cause gastroenteritis, such as rotavirus or noroviruses. You can catch one of these viruses by consuming contaminated food or water. You may also catch a virus by sharing utensils or other personal items with an infected person or by touching a contaminated surface. SYMPTOMS  The most common symptoms are diarrhea and vomiting. These problems can cause a severe loss of body fluids (dehydration) and a body salt (electrolyte) imbalance. Other symptoms may include:  Fever.  Headache.  Fatigue.  Abdominal pain. DIAGNOSIS  Your caregiver can usually diagnose viral gastroenteritis based on your symptoms and a physical exam. A stool sample may also be taken to test for the presence of viruses or other infections. TREATMENT  This illness typically goes away on its own. Treatments are aimed at rehydration. The most serious cases of viral gastroenteritis involve vomiting so severely that you are not able to keep fluids down. In these cases, fluids must be given through an intravenous line (IV). HOME CARE INSTRUCTIONS   Drink enough fluids to keep your urine clear or pale yellow. Drink small amounts of fluids frequently and increase the amounts as tolerated.  Ask your caregiver for specific rehydration instructions.  Avoid:  Foods high in sugar.  Alcohol.  Carbonated drinks.  Tobacco.  Juice.  Caffeine drinks.  Extremely hot or cold fluids.  Fatty, greasy foods.  Too much intake of anything at one time.  Dairy products until 24 to 48 hours after diarrhea stops.  You may consume probiotics.  Probiotics are active cultures of beneficial bacteria. They may lessen the amount and number of diarrheal stools in adults. Probiotics can be found in yogurt with active cultures and in supplements.  Wash your hands well to avoid spreading the virus.  Only take over-the-counter or prescription medicines for pain, discomfort, or fever as directed by your caregiver. Do not give aspirin to children. Antidiarrheal medicines are not recommended.  Ask your caregiver if you should continue to take your regular prescribed and over-the-counter medicines.  Keep all follow-up appointments as directed by your caregiver. SEEK IMMEDIATE MEDICAL CARE IF:   You are unable to keep fluids down.  You do not urinate at least once every 6 to 8 hours.  You develop shortness of breath.  You notice blood in your stool or vomit. This may look like coffee grounds.  You have abdominal pain that increases or is concentrated in one small area (localized).  You have persistent vomiting or diarrhea.  You have a fever.  The patient is a child younger than 3 months, and he or she has a fever.  The patient is a child older than 3 months, and he or she has a fever and persistent symptoms.  The patient is a child older than 3 months, and he or she has a fever and symptoms suddenly get worse.  The patient is a baby, and he or she has no tears when crying. MAKE SURE YOU:   Understand these instructions.  Will watch your condition.  Will get help right away if you are not doing well or get worse. Document Released: 02/17/2005 Document Revised: 05/12/2011 Document Reviewed: 12/04/2010   [employer] Patient Information 2015 [place]. This information is not intended to replace advice given to you by your health care provider. Make sure you discuss any questions you have with your health care provider.  Stop doxycycline. Be certain to stay well-hydrated, may need to use mixture of water/Gatorade and ginger ale.  Attempt to take at least 70 ounces of water a day. I will call you and Zofran to help you with the nausea. I would recommend an over-the-counter antidiarrheal. Return to the clinic if her diarrhea isn't resolved next week, we would have to have a stool sample at that time.

## 2013-11-07 ENCOUNTER — Other Ambulatory Visit: Payer: Self-pay | Admitting: Family Medicine

## 2015-01-17 ENCOUNTER — Encounter (HOSPITAL_COMMUNITY): Payer: Self-pay | Admitting: Cardiology

## 2015-01-17 ENCOUNTER — Emergency Department (HOSPITAL_COMMUNITY)
Admission: EM | Admit: 2015-01-17 | Discharge: 2015-01-17 | Disposition: A | Discharge status: Home / Self Care | Payer: Medicaid Other | Attending: Emergency Medicine | Admitting: Emergency Medicine

## 2015-01-17 DIAGNOSIS — F329 Major depressive disorder, single episode, unspecified: Secondary | ICD-10-CM | POA: Insufficient documentation

## 2015-01-17 DIAGNOSIS — Z87440 Personal history of urinary (tract) infections: Secondary | ICD-10-CM | POA: Insufficient documentation

## 2015-01-17 DIAGNOSIS — E669 Obesity, unspecified: Secondary | ICD-10-CM | POA: Insufficient documentation

## 2015-01-17 DIAGNOSIS — Z792 Long term (current) use of antibiotics: Secondary | ICD-10-CM | POA: Insufficient documentation

## 2015-01-17 DIAGNOSIS — G43909 Migraine, unspecified, not intractable, without status migrainosus: Secondary | ICD-10-CM | POA: Insufficient documentation

## 2015-01-17 DIAGNOSIS — M1990 Unspecified osteoarthritis, unspecified site: Secondary | ICD-10-CM | POA: Insufficient documentation

## 2015-01-17 DIAGNOSIS — L509 Urticaria, unspecified: Secondary | ICD-10-CM | POA: Insufficient documentation

## 2015-01-17 DIAGNOSIS — Z79899 Other long term (current) drug therapy: Secondary | ICD-10-CM | POA: Insufficient documentation

## 2015-01-17 DIAGNOSIS — Z87891 Personal history of nicotine dependence: Secondary | ICD-10-CM | POA: Insufficient documentation

## 2015-01-17 MED ORDER — DIPHENHYDRAMINE HCL 25 MG PO CAPS
25.0000 mg | ORAL_CAPSULE | Freq: Once | ORAL | Status: AC
  Administered 2015-01-17: 25 mg via ORAL
  Filled 2015-01-17: qty 1

## 2015-01-17 MED ORDER — RANITIDINE HCL 150 MG PO CAPS
150.0000 mg | ORAL_CAPSULE | Freq: Every day | ORAL | Status: DC
Start: 2015-01-17 — End: 2016-10-15

## 2015-01-17 MED ORDER — DIPHENHYDRAMINE HCL 25 MG PO TABS: 25.0000 mg | ORAL_TABLET | Freq: Four times a day (QID) | ORAL | Status: DC

## 2015-01-17 NOTE — Discharge Instructions (Signed)
Hives Hives are itchy, red, swollen areas of the skin. They can vary in size and location on your body. Hives can come and go for hours or several days (acute hives) or for several weeks (chronic hives). Hives do not spread from person to person (noncontagious). They may get worse with scratching, exercise, and emotional stress. CAUSES   Allergic reaction to food, additives, or drugs.  Infections, including the common cold.  Illness, such as vasculitis, lupus, or thyroid disease.  Exposure to sunlight, heat, or cold.  Exercise.  Stress.  Contact with chemicals. SYMPTOMS   Red or white swollen patches on the skin. The patches may change size, shape, and location quickly and repeatedly.  Itching.  Swelling of the hands, feet, and face. This may occur if hives develop deeper in the skin. DIAGNOSIS  Your caregiver can usually tell what is wrong by performing a physical exam. Skin or blood tests may also be done to determine the cause of your hives. In some cases, the cause cannot be determined. TREATMENT  Mild cases usually get better with medicines such as antihistamines. Severe cases may require an emergency epinephrine injection. If the cause of your hives is known, treatment includes avoiding that trigger.  HOME CARE INSTRUCTIONS   Avoid causes that trigger your hives.  Take antihistamines as directed by your caregiver to reduce the severity of your hives. Non-sedating or low-sedating antihistamines are usually recommended. Do not drive while taking an antihistamine.  Take any other medicines prescribed for itching as directed by your caregiver.  Wear loose-fitting clothing.  Keep all follow-up appointments as directed by your caregiver. SEEK MEDICAL CARE IF:   You have persistent or severe itching that is not relieved with medicine.  You have painful or swollen joints. SEEK IMMEDIATE MEDICAL CARE IF:   You have a fever.  Your tongue or lips are swollen.  You have  trouble breathing or swallowing.  You feel tightness in the throat or chest.  You have abdominal pain. These problems may be the first sign of a life-threatening allergic reaction. Call your local emergency services (911 in [place] MAKE SURE YOU:   Understand these instructions.  Will watch your condition.  Will get help right away if you are not doing well or get worse.   This information is not intended to replace advice given to you by your health care provider. Make sure you discuss any questions you have with your health care provider.   Document Released: 02/17/2005 Document Revised: 02/22/2013 Document Reviewed: 05/13/2011 Elsevier Interactive Patient Education [employer].  Please read attached information. If you experience any new or worsening signs or symptoms please return to the emergency room for evaluation. Please follow-up with your primary care provider or specialist as discussed. Please use medication prescribed only as directed and discontinue taking if you have any concerning signs or symptoms.

## 2015-01-17 NOTE — ED Provider Notes (Signed)
CSN: [id number]     Arrival date & time 01/17/15  1229 History  By signing my name below, I, Essence Howell, attest that this documentation has been prepared under the direction and in the presence of Okey Regal, PA-C Electronically Signed: Ladene Artist, ED Scribe 01/17/2015 at 2:24 PM.   Chief Complaint  Patient presents with  . Rash   The history is provided by the patient. No language interpreter was used.   HPI Comments: Erika [name] is a 34 y.o. female who presents to the Emergency Department complaining of generalized pruritic rash onset this morning upon waking. Pt reports associated swelling to her right lower arm and tightness in her throat since this morning. She denies SOB, wheezing. She also denies new foods, lotions, soaps, body wash, detergent, linen.   Past Medical History  Diagnosis Date  . Migraine   . Back pain   . Arthritis   . Depression   . Obesity   . UTI (urinary tract infection)    Past Surgical History  Procedure Laterality Date  . Cesarean section  08/20/2005  . Tubal ligation    . 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 Tonsillectomy    . Adenoidectomy    . Nasal sinus surgery    . Mouth surgery    . Incision and drainage abscess Right     with packing on right side   Family History  Problem Relation Age of Onset  . Cancer Maternal Grandmother     breast ca, cervical  . Cancer Paternal Grandmother     breast ca  . Alcohol abuse Father   . Colon cancer Neg Hx   . Breast cancer      pat gm's sister   Social History  Substance Use Topics  . Smoking status: Former Smoker -- 0.50 packs/day    Quit date: 03/03/2010  . Smokeless tobacco: Never Used  . Alcohol Use: Yes     Comment: rare   OB History    No data available     Review of Systems  All other systems reviewed and are negative.  Allergies  Review of patient's allergies indicates no known allergies.  Home Medications   Prior to Admission  medications   Medication Sig Start Date End Date Taking? Authorizing Provider  cyclobenzaprine (FLEXERIL) 10 MG tablet Take 10 mg by mouth 3 (three) times daily as needed for muscle spasms.    Historical Provider, MD  diphenhydrAMINE (BENADRYL) 25 MG tablet Take 1 tablet (25 mg total) by mouth every 6 (six) hours. 01/17/15   Okey Regal, PA-C  doxycycline (VIBRA-TABS) 100 MG tablet Take 1 tablet (100 mg total) by mouth 2 (two) times daily. 10/06/13   Renee A Kuneff, DO  fluticasone (FLONASE) 50 MCG/ACT nasal spray PLACE 2 SPRAYS INTO THE NOSE DAILY.    Dayarmys [place] de Gwendalyn Ege, MD  levothyroxine (SYNTHROID, LEVOTHROID) 25 MCG tablet TAKE 1 TABLET BY MOUTH DAILY BEFORE BREAKFAST. 11/08/13   Coral Spikes, DO  LORazepam (ATIVAN) 0.5 MG tablet Take 1 tablet (0.5 mg total) by mouth 2 (two) times daily as needed for anxiety. 08/05/13   Gerda Diss, MD  ondansetron (ZOFRAN) 4 MG tablet Take 1 tablet (4 mg total) by mouth every 8 (eight) hours as needed for nausea or vomiting. 10/11/13   Renee A Kuneff, DO  oxyCODONE (OXY IR/ROXICODONE) 5 MG immediate release tablet Take 1 tablet (5 mg total) by mouth  every 4 (four) hours as needed for severe pain. 08/05/13   Gerda Diss, MD  promethazine (PHENERGAN) 25 MG tablet Take 25 mg by mouth as needed for nausea or vomiting.    Historical Provider, MD  ranitidine (ZANTAC) 150 MG capsule Take 1 capsule (150 mg total) by mouth daily. 01/17/15   Okey Regal, PA-C  SUMAtriptan (IMITREX) 50 MG tablet Take 50 mg by mouth every 2 (two) hours as needed for migraine or headache. 1 pill at start of migraine symptoms. If no improvement 2 pills 2 hours later. 11/20/11   Donnamae Jude, MD  topiramate (TOPAMAX) 25 MG tablet Take 25 mg by mouth daily as needed.    Historical Provider, MD   BP 119/74 mmHg  Pulse 85  Temp(Src) 98.2 F (36.8 C) (Oral)  Resp 18  Ht 5\' 4"  (1.626 m)  Wt 220 lb (99.791 kg)  BMI 37.74 kg/m2  SpO2 99%  LMP 01/17/2015  Physical Exam   Constitutional: She is oriented to person, place, and time. She appears well-developed and well-nourished. No distress.  HENT:  Head: Normocephalic and atraumatic.  Right Ear: Tympanic membrane normal.  Left Ear: Tympanic membrane normal.  Mouth/Throat: Oropharynx is clear and moist and mucous membranes are normal.  Eyes: Conjunctivae and EOM are normal.  Neck: Neck supple. No tracheal deviation present.  Cardiovascular: Normal rate.   Pulmonary/Chest: Effort normal. No respiratory distress.  Musculoskeletal: Normal range of motion.  Neurological: She is alert and oriented to person, place, and time.  Skin: Skin is warm and dry. Rash noted.  Hives to UE bilateral, L LE and R breast. No hives present on the back.   Psychiatric: She has a normal mood and affect. Her behavior is normal.  Nursing note and vitals reviewed.  ED Course  Procedures (including critical care time) DIAGNOSTIC STUDIES: Oxygen Saturation is 99% on RA, normal by my interpretation.    COORDINATION OF CARE: 2:20 PM-Discussed treatment plan which includes Benadryl and follow-up with PCP with pt at bedside and pt agreed to plan.   Labs Review Labs Reviewed - No data to display  Imaging Review No results found. I have personally reviewed and evaluated these images and lab results as part of my medical decision-making.   EKG Interpretation None      MDM   Final diagnoses:  Hives   Labs:  Imaging:  Consults:  Therapeutics: Benadryl   Discharge Meds: Benadryl and zantac   Assessment/Plan: 34 year old female presents today with hives. No known exacerbating factors, has not tried any medications prior to arrival, she has no airway involvement. Patient in no acute distress, she will be discharged home with instructions to use Benadryl, but this does not improve symptoms, to begin using Zantac. If symptoms persist beyond 2 days she's instructed to follow-up with her primary care provider for reevaluation,  if symptoms worsen she is instructed return to the emergency room immediately for further evaluation. Patient verbalized understanding and agreement for today's plan and had no further questions or concerns at time of discharge  I personally performed the services described in this documentation, which was scribed in my presence. The recorded information has been reviewed and is accurate.    Okey Regal, PA-C 01/17/15 [place], MD 01/18/15 [phone]

## 2015-01-17 NOTE — ED Notes (Signed)
Reports a rash to legs and arms this morning when she woke up. Denies any contact with new substances.

## 2015-01-17 NOTE — ED Notes (Signed)
See PA'S skin assessment of skin

## 2015-01-17 NOTE — ED Notes (Signed)
Declined W/C at D/C and was escorted to lobby by RN. 

## 2015-06-05 ENCOUNTER — Encounter (HOSPITAL_BASED_OUTPATIENT_CLINIC_OR_DEPARTMENT_OTHER): Payer: Self-pay | Admitting: Emergency Medicine

## 2015-06-05 ENCOUNTER — Emergency Department (HOSPITAL_BASED_OUTPATIENT_CLINIC_OR_DEPARTMENT_OTHER)
Admission: EM | Admit: 2015-06-05 | Discharge: 2015-06-05 | Disposition: A | Discharge status: Home / Self Care | Payer: Medicaid Other | Attending: Emergency Medicine | Admitting: Emergency Medicine

## 2015-06-05 DIAGNOSIS — E669 Obesity, unspecified: Secondary | ICD-10-CM | POA: Insufficient documentation

## 2015-06-05 DIAGNOSIS — Z7951 Long term (current) use of inhaled steroids: Secondary | ICD-10-CM | POA: Insufficient documentation

## 2015-06-05 DIAGNOSIS — F329 Major depressive disorder, single episode, unspecified: Secondary | ICD-10-CM | POA: Insufficient documentation

## 2015-06-05 DIAGNOSIS — Z87891 Personal history of nicotine dependence: Secondary | ICD-10-CM | POA: Insufficient documentation

## 2015-06-05 DIAGNOSIS — J040 Acute laryngitis: Secondary | ICD-10-CM | POA: Insufficient documentation

## 2015-06-05 DIAGNOSIS — G43909 Migraine, unspecified, not intractable, without status migrainosus: Secondary | ICD-10-CM | POA: Insufficient documentation

## 2015-06-05 DIAGNOSIS — Z87440 Personal history of urinary (tract) infections: Secondary | ICD-10-CM | POA: Insufficient documentation

## 2015-06-05 DIAGNOSIS — Z79899 Other long term (current) drug therapy: Secondary | ICD-10-CM | POA: Insufficient documentation

## 2015-06-05 DIAGNOSIS — Z792 Long term (current) use of antibiotics: Secondary | ICD-10-CM | POA: Insufficient documentation

## 2015-06-05 DIAGNOSIS — Z9889 Other specified postprocedural states: Secondary | ICD-10-CM | POA: Insufficient documentation

## 2015-06-05 NOTE — Discharge Instructions (Signed)
Maintain vocal rest for the next 48 hours, push fluids and stay well hydrated.  Please follow with your primary care doctor in the next 2 days for a check-up. They must obtain records for further management.   Do not hesitate to return to the Emergency Department for any new, worsening or concerning symptoms.    Laryngitis Laryngitis is inflammation of your vocal cords. This causes hoarseness, coughing, loss of voice, sore throat, or a dry throat. Your vocal cords are two bands of muscles that are found in your throat. When you speak, these cords come together and vibrate. These vibrations come out through your mouth as sound. When your vocal cords are inflamed, your voice sounds different. Laryngitis can be temporary (acute) or long-term (chronic). Most cases of acute laryngitis improve with time. Chronic laryngitis is laryngitis that lasts for more than three weeks. CAUSES Acute laryngitis may be caused by:  A viral infection.  Lots of talking, yelling, or singing. This is also called vocal strain.  Bacterial infections. Chronic laryngitis may be caused by:  Vocal strain.  Injury to your vocal cords.  Acid reflux (gastroesophageal reflux disease or GERD).  Allergies.  Sinus infection.  Smoking.  Alcohol abuse.  Breathing in chemicals or dust.  Growths on the vocal cords. RISK FACTORS Risk factors for laryngitis include:  Smoking.  Alcohol abuse.  Having allergies. SIGNS AND SYMPTOMS Symptoms of laryngitis may include:  Low, hoarse voice.  Loss of voice.  Dry cough.  Sore throat.  Stuffy nose. DIAGNOSIS Laryngitis may be diagnosed by:  Physical exam.  Throat culture.  Blood test.  Laryngoscopy. This procedure allows your health care provider to look at your vocal cords with a mirror or viewing tube. TREATMENT Treatment for laryngitis depends on what is causing it. Usually, treatment involves resting your voice and using medicines to soothe your  throat. However, if your laryngitis is caused by a bacterial infection, you may need to take antibiotic medicine. If your laryngitis is caused by a growth, you may need to have a procedure to remove it. HOME CARE INSTRUCTIONS  Drink enough fluid to keep your urine clear or pale yellow.  Breathe in moist air. Use a humidifier if you live in a dry climate.  Take medicines only as directed by your health care provider.  If you were prescribed an antibiotic medicine, finish it all even if you start to feel better.  Do not smoke cigarettes or electronic cigarettes. If you need help quitting, ask your health care provider.  Talk as little as possible. Also avoid whispering, which can cause vocal strain.  Write instead of talking. Do this until your voice is back to normal. SEEK MEDICAL CARE IF:  You have a fever.  You have increasing pain.  You have difficulty swallowing. SEEK IMMEDIATE MEDICAL CARE IF:  You cough up blood.  You have trouble breathing.   This information is not intended to replace advice given to you by your health care provider. Make sure you discuss any questions you have with your health care provider.   Document Released: 02/17/2005 Document Revised: 03/10/2014 Document Reviewed: 08/02/2013 Elsevier Interactive Patient Education [employer].

## 2015-06-05 NOTE — ED Notes (Signed)
Patient stated that she has had a cough and has been loosing her voice x 1 week.

## 2015-06-05 NOTE — ED Provider Notes (Signed)
CSN: Erika [name]:[id number]     Arrival date & time 06/05/15  1957 History   First MD Initiated Contact with Patient 06/05/15 2056     Chief Complaint  Patient presents with  . Cough     (Consider location/radiation/quality/duration/timing/severity/associated sxs/prior Treatment) HPI   Blood pressure 117/65, pulse 82, temperature 98 F (36.7 C), temperature source Oral, resp. rate 18, height 5\' 4"  (1.626 m), weight 97.523 kg, last menstrual period 05/29/2015, SpO2 100 %.  Erika [name] is a 35 y.o. female complaining of dry cough and hoarse voice worsening over the course of one week. Patient denies chest pain, shortness of breath, fever, chills, rhinorrhea, abdominal pain, change in bowel or bladder habits. No medication taken prior to arrival.  Past Medical History  Diagnosis Date  . Migraine   . Back pain   . Arthritis   . Depression   . Obesity   . UTI (urinary tract infection)    Past Surgical History  Procedure Laterality Date  . Cesarean section  08/20/2005  . Tubal ligation    . 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 Tonsillectomy    . Adenoidectomy    . Nasal sinus surgery    . Mouth surgery    . Incision and drainage abscess Right     with packing on right side   Family History  Problem Relation Age of Onset  . Cancer Maternal Grandmother     breast ca, cervical  . Cancer Paternal Grandmother     breast ca  . Alcohol abuse Father   . Colon cancer Neg Hx   . Breast cancer      pat gm's sister   Social History  Substance Use Topics  . Smoking status: Former Smoker -- 0.50 packs/day    Quit date: 03/03/2010  . Smokeless tobacco: Never Used  . Alcohol Use: Yes     Comment: rare   OB History    No data available     Review of Systems  10 systems reviewed and found to be negative, except as noted in the HPI.   Allergies  Review of patient's allergies indicates no known allergies.  Home Medications   Prior to Admission medications    Medication Sig Start Date End Date Taking? Authorizing Provider  cyclobenzaprine (FLEXERIL) 10 MG tablet Take 10 mg by mouth 3 (three) times daily as needed for muscle spasms.    Historical Provider, MD  diphenhydrAMINE (BENADRYL) 25 MG tablet Take 1 tablet (25 mg total) by mouth every 6 (six) hours. 01/17/15   Okey Regal, PA-C  doxycycline (VIBRA-TABS) 100 MG tablet Take 1 tablet (100 mg total) by mouth 2 (two) times daily. 10/06/13   Renee A Kuneff, DO  fluticasone (FLONASE) 50 MCG/ACT nasal spray PLACE 2 SPRAYS INTO THE NOSE DAILY.    Dayarmys [place]yn Ege, MD  levothyroxine (SYNTHROID, LEVOTHROID) 25 MCG tablet TAKE 1 TABLET BY MOUTH DAILY BEFORE BREAKFAST. 11/08/13   Coral Spikes, DO  LORazepam (ATIVAN) 0.5 MG tablet Take 1 tablet (0.5 mg total) by mouth 2 (two) times daily as needed for anxiety. 08/05/13   Gerda Diss, DO  ondansetron (ZOFRAN) 4 MG tablet Take 1 tablet (4 mg total) by mouth every 8 (eight) hours as needed for nausea or vomiting. 10/11/13   Renee A Kuneff, DO  oxyCODONE (OXY IR/ROXICODONE) 5 MG immediate release tablet Take 1 tablet (5 mg total) by mouth every 4 (four)  hours as needed for severe pain. 08/05/13   Gerda Diss, DO  promethazine (PHENERGAN) 25 MG tablet Take 25 mg by mouth as needed for nausea or vomiting.    Historical Provider, MD  ranitidine (ZANTAC) 150 MG capsule Take 1 capsule (150 mg total) by mouth daily. 01/17/15   Okey Regal, PA-C  SUMAtriptan (IMITREX) 50 MG tablet Take 50 mg by mouth every 2 (two) hours as needed for migraine or headache. 1 pill at start of migraine symptoms. If no improvement 2 pills 2 hours later. 11/20/11   Donnamae Jude, MD  topiramate (TOPAMAX) 25 MG tablet Take 25 mg by mouth daily as needed.    Historical Provider, MD   BP 117/65 mmHg  Pulse 82  Temp(Src) 98 F (36.7 C) (Oral)  Resp 18  Ht 5\' 4"  (1.626 m)  Wt 97.523 kg  BMI 36.89 kg/m2  SpO2 100%  LMP 05/29/2015 Physical Exam  Constitutional: She is oriented  to person, place, and time. She appears well-developed and well-nourished. No distress.  HENT:  Head: Normocephalic.  Mouth/Throat: Oropharynx is clear and moist.  Slightly hoarse, more high-pitched voice  Eyes: Conjunctivae and EOM are normal. Pupils are equal, round, and reactive to light.  Neck: Normal range of motion.  Cardiovascular: Normal rate, regular rhythm and intact distal pulses.   Pulmonary/Chest: Effort normal and breath sounds normal. No stridor. No respiratory distress. She has no wheezes. She has no rales. She exhibits no tenderness.  Abdominal: Soft. Bowel sounds are normal.  Musculoskeletal: Normal range of motion.  Neurological: She is alert and oriented to person, place, and time.  Psychiatric: She has a normal mood and affect.  Nursing note and vitals reviewed.   ED Course  Procedures (including critical care time) Labs Review Labs Reviewed - No data to display  Imaging Review No results found. I have personally reviewed and evaluated these images and lab results as part of my medical decision-making.   EKG Interpretation None      MDM   Final diagnoses:  Laryngitis    Filed Vitals:   06/05/15 2020  BP: 117/65  Pulse: 82  Temp: 98 F (36.7 C)  TempSrc: Oral  Resp: 18  Height: 5\' 4"  (1.626 m)  Weight: 97.523 kg  SpO2: 100%    Erika [name] is 35 y.o. female presenting with Cough and hoarseness with voice being extremely high-pitched. Lung sounds clear to auscultation, patient saturating well on room air, no tachypnea or tachycardia. Likely viral URI with touch of laryngitis. Patient advised vocal rest, aggressive hydration. Work note is provided.   Evaluation does not show pathology that would require ongoing emergent intervention or inpatient treatment. Pt is hemodynamically stable and mentating appropriately. Discussed findings and plan with patient/guardian, who agrees with care plan. All questions answered. Return precautions discussed and  outpatient follow up given.      Monico Blitz, PA-C 06/05/15 2117  Forde Dandy, MD 06/06/15 1230

## 2016-01-01 ENCOUNTER — Emergency Department (HOSPITAL_BASED_OUTPATIENT_CLINIC_OR_DEPARTMENT_OTHER)
Admission: EM | Admit: 2016-01-01 | Discharge: 2016-01-01 | Disposition: A | Discharge status: Home / Self Care | Payer: Medicaid Other | Attending: Emergency Medicine | Admitting: Emergency Medicine

## 2016-01-01 ENCOUNTER — Encounter (HOSPITAL_BASED_OUTPATIENT_CLINIC_OR_DEPARTMENT_OTHER): Payer: Self-pay

## 2016-01-01 DIAGNOSIS — M25561 Pain in right knee: Secondary | ICD-10-CM | POA: Insufficient documentation

## 2016-01-01 DIAGNOSIS — Z87891 Personal history of nicotine dependence: Secondary | ICD-10-CM | POA: Insufficient documentation

## 2016-01-01 MED ORDER — NAPROXEN SODIUM 220 MG PO TABS: 220.0000 mg | ORAL_TABLET | Freq: Two times a day (BID) | ORAL | 1 refills | Status: DC

## 2016-01-01 NOTE — ED Triage Notes (Signed)
Pt c/o rt knee pain, denies injury; states worse when straighten

## 2016-01-01 NOTE — ED Provider Notes (Signed)
[facility] Provider Note   CSN: [id number] Arrival date & time: 01/01/16  [id number]     History   Chief Complaint Chief Complaint  Patient presents with  . Knee Pain    HPI JAILEI [name] is a 35 y.o. female.  HPI Patient works at [employer] standing for long hours. Yesterday, after work she started to develop a pain in her right medial anterior knee. She reports that it is now very painful to completely straighten the leg. It is also painful in certain positions while walking. No associated symptoms. The patient had a concern for possible anterior cruciate ligament tear because she had that once in the other knee although that occurred from jumping on a trampoline. No associated swelling or redness. Past Medical History:  Diagnosis Date  . Arthritis   . Back pain   . Depression   . Migraine   . Obesity   . UTI (urinary tract infection)     Patient Active Problem List   Diagnosis Date Noted  . Viral gastroenteritis 10/11/2013  . Back abscess 10/06/2013  . Elevated TSH 08/29/2013  . Palpitations 08/05/2013  . Seasonal allergies 06/21/2012  . Migraine with aura 03/06/2011  . LOW BACK PAIN, CHRONIC 08/14/2009  . Moderate dysplasia of cervix 01/17/2008  . ACL TEAR 04/26/2007  . OBESITY, NOS 04/30/2006  . BIPOLAR DISORDER 04/30/2006    Past Surgical History:  Procedure Laterality Date  . ADENOIDECTOMY    . 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 CESAREAN SECTION  08/20/2005  . INCISION AND DRAINAGE ABSCESS Right    with packing on right side  . MOUTH SURGERY    . NASAL SINUS SURGERY    . TONSILLECTOMY    . TUBAL LIGATION      OB History    No data available       Home Medications    Prior to Admission medications   Medication Sig Start Date End Date Taking? Authorizing Provider  cyclobenzaprine (FLEXERIL) 10 MG tablet Take 10 mg by mouth 3 (three) times daily as needed for muscle spasms.    Historical Provider, MD    diphenhydrAMINE (BENADRYL) 25 MG tablet Take 1 tablet (25 mg total) by mouth every 6 (six) hours. 01/17/15   Okey Regal, PA-C  doxycycline (VIBRA-TABS) 100 MG tablet Take 1 tablet (100 mg total) by mouth 2 (two) times daily. 10/06/13   Renee A Kuneff, DO  fluticasone (FLONASE) 50 MCG/ACT nasal spray PLACE 2 SPRAYS INTO THE NOSE DAILY.    Dayarmys [place] de Gwendalyn Ege, MD  levothyroxine (SYNTHROID, LEVOTHROID) 25 MCG tablet TAKE 1 TABLET BY MOUTH DAILY BEFORE BREAKFAST. 11/08/13   Coral Spikes, DO  LORazepam (ATIVAN) 0.5 MG tablet Take 1 tablet (0.5 mg total) by mouth 2 (two) times daily as needed for anxiety. 08/05/13   Gerda Diss, DO  naproxen sodium (ALEVE) 220 MG tablet Take 1 tablet (220 mg total) by mouth 2 (two) times daily with a meal. 01/01/16   Charlesetta Shanks, MD  ondansetron (ZOFRAN) 4 MG tablet Take 1 tablet (4 mg total) by mouth every 8 (eight) hours as needed for nausea or vomiting. 10/11/13   Renee A Kuneff, DO  oxyCODONE (OXY IR/ROXICODONE) 5 MG immediate release tablet Take 1 tablet (5 mg total) by mouth every 4 (four) hours as needed for severe pain. 08/05/13   Gerda Diss, DO  promethazine (PHENERGAN) 25 MG tablet Take 25 mg by  mouth as needed for nausea or vomiting.    Historical Provider, MD  ranitidine (ZANTAC) 150 MG capsule Take 1 capsule (150 mg total) by mouth daily. 01/17/15   Okey Regal, PA-C  SUMAtriptan (IMITREX) 50 MG tablet Take 50 mg by mouth every 2 (two) hours as needed for migraine or headache. 1 pill at start of migraine symptoms. If no improvement 2 pills 2 hours later. 11/20/11   Donnamae Jude, MD  topiramate (TOPAMAX) 25 MG tablet Take 25 mg by mouth daily as needed.    Historical Provider, MD    Family History Family History  Problem Relation Age of Onset  . Alcohol abuse Father   . Cancer Maternal Grandmother     breast ca, cervical  . Cancer Paternal Grandmother     breast ca  . Breast cancer      pat gm's sister  . Colon cancer Neg Hx      Social History Social History  Substance Use Topics  . Smoking status: Former Smoker    Packs/day: 0.50    Quit date: 03/03/2010  . Smokeless tobacco: Never Used  . Alcohol use Yes     Comment: rare     Allergies   Review of patient's allergies indicates no known allergies.   Review of Systems Review of Systems Constitutional: No fevers no chills no general malaise Muscle skeletal: No calf pain no lower leg pain or swelling.  Physical Exam Updated Vital Signs BP 99/68 (BP Location: Right Arm)   Pulse 76   Temp 97.8 F (36.6 C) (Oral)   Resp 18   LMP 12/25/2015   SpO2 100%   Physical Exam  Constitutional: She is oriented to person, place, and time. She appears well-developed and well-nourished. No distress.  HENT:  Head: Normocephalic and atraumatic.  Eyes: EOM are normal.  Pulmonary/Chest: Effort normal.  Musculoskeletal:  Visual inspection of right lower leg is normal. No evident effusion or erythema. Calf is soft and nontender. Foot and ankle nontender with normal range of motion. Focal point of tenderness to the joint line medial of the patella. Range of motion is intact except pain with complete extension. No pain with complete flexion. Patella has slight crepitus.  Neurological: She is alert and oriented to person, place, and time. She exhibits normal muscle tone. Coordination normal.  Skin: Skin is warm and dry.  Psychiatric: She has a normal mood and affect.     ED Treatments / Results  Labs (all labs ordered are listed, but only abnormal results are displayed) Labs Reviewed - No data to display  EKG  EKG Interpretation None       Radiology No results found.  Procedures Procedures (including critical care time)  Medications Ordered in ED Medications - No data to display   Initial Impression / Assessment and Plan / ED Course  I have reviewed the triage vital signs and the nursing notes.  Pertinent labs & imaging results that were  available during my care of the patient were reviewed by me and considered in my medical decision making (see chart for details).  Clinical Course    Final Clinical Impressions(s) / ED Diagnoses   Final diagnoses:  Acute pain of right knee  No associated injury. Focal medial joint line pain without effusion or range of motion limitation except for pain with complete or hyperextension. Patient works long hours standing and has moderate obesity. I suspect overuse syndrome with possible minor meniscal tear or inflammation. Plan will be for  knee sleeve ice, elevation Aleve and follow-up with sports medicine orthopedics.  New Prescriptions New Prescriptions   NAPROXEN SODIUM (ALEVE) 220 MG TABLET    Take 1 tablet (220 mg total) by mouth 2 (two) times daily with a meal.     Charlesetta Shanks, MD 01/01/16 [id number]

## 2016-04-09 ENCOUNTER — Encounter (HOSPITAL_BASED_OUTPATIENT_CLINIC_OR_DEPARTMENT_OTHER): Payer: Self-pay

## 2016-04-09 ENCOUNTER — Emergency Department (HOSPITAL_BASED_OUTPATIENT_CLINIC_OR_DEPARTMENT_OTHER): Payer: Self-pay

## 2016-04-09 ENCOUNTER — Emergency Department (HOSPITAL_BASED_OUTPATIENT_CLINIC_OR_DEPARTMENT_OTHER)
Admission: EM | Admit: 2016-04-09 | Discharge: 2016-04-09 | Disposition: A | Discharge status: Home / Self Care | Payer: Self-pay | Attending: Emergency Medicine | Admitting: Emergency Medicine

## 2016-04-09 DIAGNOSIS — Y939 Activity, unspecified: Secondary | ICD-10-CM | POA: Insufficient documentation

## 2016-04-09 DIAGNOSIS — S82892A Other fracture of left lower leg, initial encounter for closed fracture: Secondary | ICD-10-CM | POA: Insufficient documentation

## 2016-04-09 DIAGNOSIS — Z87891 Personal history of nicotine dependence: Secondary | ICD-10-CM | POA: Insufficient documentation

## 2016-04-09 DIAGNOSIS — Y929 Unspecified place or not applicable: Secondary | ICD-10-CM | POA: Insufficient documentation

## 2016-04-09 DIAGNOSIS — X501XXA Overexertion from prolonged static or awkward postures, initial encounter: Secondary | ICD-10-CM | POA: Insufficient documentation

## 2016-04-09 DIAGNOSIS — Y999 Unspecified external cause status: Secondary | ICD-10-CM | POA: Insufficient documentation

## 2016-04-09 MED ORDER — MELOXICAM 15 MG PO TABS: 15.0000 mg | ORAL_TABLET | Freq: Every day | ORAL | 0 refills | Status: DC

## 2016-04-09 MED ORDER — NAPROXEN 250 MG PO TABS
500.0000 mg | ORAL_TABLET | Freq: Once | ORAL | Status: AC
Start: 2016-04-09 — End: 2016-04-09
  Administered 2016-04-09: 500 mg via ORAL
  Filled 2016-04-09: qty 2

## 2016-04-09 NOTE — ED Triage Notes (Signed)
Pt fell and rolled her left ankle this afternoon, c/o left ankle pain that shoots into her knee

## 2016-04-09 NOTE — ED Provider Notes (Signed)
[facility] Provider Note   CSN: [id number] Arrival date & time: 04/09/16  2040   By signing my name below, I, Erika [name], attest that this documentation has been prepared under the direction and in the presence of [name], MD. Electronically Signed: Charolotte [name], Scribe. 04/09/16. 11:35 PM.    History   Chief Complaint Chief Complaint  Patient presents with  . Leg Pain    HPI Erika [name] is a 36 y.o. female with h/o torn ACL of the left knee who presents to the Emergency Department complaining of acute onset, moderate leg pain s/p fall which she describes as having her ankle twist and she fell onto her hip. Pt denies LOC or head injury.  The history is provided by the patient. No language interpreter was used.  Fall  This is a new problem. The current episode started 6 to 12 hours ago. The problem occurs constantly. The problem has not changed since onset.Pertinent negatives include no chest pain, no abdominal pain, no headaches and no shortness of breath. Nothing aggravates the symptoms. Nothing relieves the symptoms. She has tried nothing for the symptoms. The treatment provided no relief.    Past Medical History:  Diagnosis Date  . Arthritis   . Back pain   . Depression   . Migraine   . Obesity   . UTI (urinary tract infection)     Patient Active Problem List   Diagnosis Date Noted  . Viral gastroenteritis 10/11/2013  . Back abscess 10/06/2013  . Elevated TSH 08/29/2013  . Palpitations 08/05/2013  . Seasonal allergies 06/21/2012  . Migraine with aura 03/06/2011  . LOW BACK PAIN, CHRONIC 08/14/2009  . Moderate dysplasia of cervix 01/17/2008  . ACL TEAR 04/26/2007  . OBESITY, NOS 04/30/2006  . BIPOLAR DISORDER 04/30/2006    Past Surgical History:  Procedure Laterality Date  . ADENOIDECTOMY    . 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 CESAREAN SECTION  08/20/2005  . INCISION AND DRAINAGE ABSCESS Right    with packing on  right side  . MOUTH SURGERY    . NASAL SINUS SURGERY    . TONSILLECTOMY    . TUBAL LIGATION      OB History    No data available       Home Medications    Prior to Admission medications   Medication Sig Start Date End Date Taking? Authorizing Provider  cyclobenzaprine (FLEXERIL) 10 MG tablet Take 10 mg by mouth 3 (three) times daily as needed for muscle spasms.    Historical Provider, MD  diphenhydrAMINE (BENADRYL) 25 MG tablet Take 1 tablet (25 mg total) by mouth every 6 (six) hours. 01/17/15   Okey Regal, PA-C  doxycycline (VIBRA-TABS) 100 MG tablet Take 1 tablet (100 mg total) by mouth 2 (two) times daily. 10/06/13   Renee A Kuneff, DO  fluticasone (FLONASE) 50 MCG/ACT nasal spray PLACE 2 SPRAYS INTO THE NOSE DAILY.    Dayarmys Piloto de Gwendalyn Ege, MD  levothyroxine (SYNTHROID, LEVOTHROID) 25 MCG tablet TAKE 1 TABLET BY MOUTH DAILY BEFORE BREAKFAST. 11/08/13   Coral Spikes, DO  LORazepam (ATIVAN) 0.5 MG tablet Take 1 tablet (0.5 mg total) by mouth 2 (two) times daily as needed for anxiety. 08/05/13   Gerda Diss, DO  naproxen sodium (ALEVE) 220 MG tablet Take 1 tablet (220 mg total) by mouth 2 (two) times daily with a meal. 01/01/16   Charlesetta Shanks, MD  ondansetron (ZOFRAN) 4 MG tablet Take 1 tablet (4 mg total) by mouth every 8 (eight) hours as needed for nausea or vomiting. 10/11/13   Renee A Kuneff, DO  oxyCODONE (OXY IR/ROXICODONE) 5 MG immediate release tablet Take 1 tablet (5 mg total) by mouth every 4 (four) hours as needed for severe pain. 08/05/13   Gerda Diss, DO  promethazine (PHENERGAN) 25 MG tablet Take 25 mg by mouth as needed for nausea or vomiting.    Historical Provider, MD  ranitidine (ZANTAC) 150 MG capsule Take 1 capsule (150 mg total) by mouth daily. 01/17/15   Okey Regal, PA-C  SUMAtriptan (IMITREX) 50 MG tablet Take 50 mg by mouth every 2 (two) hours as needed for migraine or headache. 1 pill at start of migraine symptoms. If no improvement 2 pills 2 hours  later. 11/20/11   Donnamae Jude, MD  topiramate (TOPAMAX) 25 MG tablet Take 25 mg by mouth daily as needed.    Historical Provider, MD    Family History Family History  Problem Relation Age of Onset  . Alcohol abuse Father   . Cancer Maternal Grandmother     breast ca, cervical  . Cancer Paternal Grandmother     breast ca  . Breast cancer      pat gm's sister  . Colon cancer Neg Hx     Social History Social History  Substance Use Topics  . Smoking status: Former Smoker    Packs/day: 0.50    Quit date: 03/03/2010  . Smokeless tobacco: Never Used  . Alcohol use Yes     Comment: rare     Allergies   Patient has no known allergies.   Review of Systems Review of Systems  Respiratory: Negative for shortness of breath.   Cardiovascular: Negative for chest pain.  Gastrointestinal: Negative for abdominal pain and vomiting.  Musculoskeletal: Positive for arthralgias and myalgias.  Neurological: Negative for seizures, syncope and headaches.  All other systems reviewed and are negative.    Physical Exam Updated Vital Signs BP 127/64 (BP Location: Right Arm)   Pulse 86   Temp 98.2 F (36.8 C) (Oral)   Resp 16   Ht 5\' 3"  (1.6 m)   Wt 225 lb (102.1 kg)   SpO2 100%   BMI 39.86 kg/m   Physical Exam  Constitutional: She is oriented to person, place, and time. She appears well-developed and well-nourished.  HENT:  Head: Normocephalic and atraumatic. Head is without raccoon's eyes and without Battle's sign.  Mouth/Throat: No oropharyngeal exudate.  Eyes: Pupils are equal, round, and reactive to light.  Neck: Normal range of motion. Neck supple.  Uvula midline.  Cardiovascular: Normal rate, regular rhythm and normal heart sounds.   Pulmonary/Chest: Effort normal and breath sounds normal.  Abdominal: Soft. Bowel sounds are normal. She exhibits no mass. There is no tenderness. There is no rebound and no guarding.  Musculoskeletal: Normal range of motion. She exhibits no  deformity.       Left hip: Normal. She exhibits no bony tenderness and no swelling.       Left knee: Normal.       Left ankle: She exhibits no swelling, no ecchymosis, no deformity, no laceration and normal pulse. Tenderness. Lateral malleolus tenderness found. No AITFL, no CF ligament, no posterior TFL, no head of 5th metatarsal and no proximal fibula tenderness found. Achilles tendon normal.   Pelvis is stable. Left hip is well seated within the joint. Tendons intact. Negative anterior and  posterior drawer tests of the left knee.  No patella alta or baja.  No laxity to varus or valgus stress.    Lymphadenopathy:    She has no cervical adenopathy.  Neurological: She is alert and oriented to person, place, and time.  Skin: Skin is warm and dry. Capillary refill takes less than 2 seconds.  Psychiatric: She has a normal mood and affect.  Nursing note and vitals reviewed.    ED Treatments / Results   Vitals:   04/09/16 2054 04/09/16 2308  BP: 110/67 127/64  Pulse: 88 86  Resp: 18 16  Temp: 98.2 F (36.8 C)     DIAGNOSTIC STUDIES: Oxygen Saturation is 100% on room air, normal by my interpretation.    COORDINATION OF CARE: 11:23 PM Discussed treatment plan with pt at bedside and pt agreed to plan.  Radiology Dg Ankle Complete Left  Result Date: 04/09/2016 CLINICAL DATA:  Fall, rolled the left ankle with pain EXAM: LEFT ANKLE COMPLETE - 3+ VIEW COMPARISON:  None. FINDINGS: Lateral soft tissue swelling. Suspect small avulsion fracture injury adjacent to the lower medial talus. Small calcaneal spur. Possible tiny avulsion intra-articular surface of the lateral malleolus. IMPRESSION: Possible small avulsion fracture injuries involving the inferior, medial talus and the medial aspect of the fibular malleolus. Electronically Signed   By: Donavan Foil M.D.   On: 04/09/2016 21:27   Dg Knee Complete 4 Views Left  Result Date: 04/09/2016 CLINICAL DATA:  Fall with pain EXAM: LEFT KNEE -  COMPLETE 4+ VIEW COMPARISON:  04/13/2007 FINDINGS: No evidence of fracture, dislocation, or joint effusion. No evidence of arthropathy or other focal bone abnormality. Soft tissues are unremarkable. IMPRESSION: Negative. Electronically Signed   By: Donavan Foil M.D.   On: 04/09/2016 21:28    Procedures Procedures (including critical care time)  Medications Ordered in ED  Medications  naproxen (NAPROSYN) tablet 500 mg (500 mg Oral Given 04/09/16 2343)     Final Clinical Impressions(s) / ED Diagnoses  Ankle fracture: Call orthopedics in am to be seen.  Wear boot at all times.  Ice and elevate leg.   All questions answered to patient's satisfaction. Based on history and exam patient has been appropriately medically screened and emergency conditions excluded. Patient is stable for discharge at this time. Strict return precautions given for any further episodes, persistent fever, weakness or any concerns.  I personally performed the services described in this documentation, which was scribed in my presence. The recorded information has been reviewed and is accurate.       Veatrice Kells, MD 04/09/16 [phone]

## 2016-04-15 ENCOUNTER — Ambulatory Visit (INDEPENDENT_AMBULATORY_CARE_PROVIDER_SITE_OTHER): Payer: Self-pay | Admitting: Orthopedic Surgery

## 2016-04-15 ENCOUNTER — Encounter (INDEPENDENT_AMBULATORY_CARE_PROVIDER_SITE_OTHER): Payer: Self-pay | Admitting: Orthopedic Surgery

## 2016-04-15 ENCOUNTER — Other Ambulatory Visit (INDEPENDENT_AMBULATORY_CARE_PROVIDER_SITE_OTHER): Payer: Self-pay

## 2016-04-15 VITALS — Ht 63.0 in | Wt 225.0 lb

## 2016-04-15 DIAGNOSIS — S93412A Sprain of calcaneofibular ligament of left ankle, initial encounter: Secondary | ICD-10-CM

## 2016-04-15 DIAGNOSIS — S93402A Sprain of unspecified ligament of left ankle, initial encounter: Secondary | ICD-10-CM

## 2016-04-15 MED ORDER — ACETAMINOPHEN-CODEINE #3 300-30 MG PO TABS
1.0000 | ORAL_TABLET | Freq: Four times a day (QID) | ORAL | 0 refills | Status: DC | PRN
Start: 2016-04-15 — End: 2016-10-15

## 2016-04-15 NOTE — Progress Notes (Signed)
Office Visit Note   Patient: Erika [name]           Date of Birth: 17-Feb-1981           MRN: [id number] Visit Date: 04/15/2016 Requested by: Erasmo Downer, MD [address] [place], [place] [address] PCP: Shirlee Latch, MD  Subjective: Chief Complaint  Patient presents with  . Left Ankle - Injury, Pain    "stepped off curb and rolled ankle" twist injury 04/10/16    HPI to the knee is a 36 show patient with left ankle pain.  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  This was an inversion injury.  She was evaluated at the emergency room and noted to have an avulsion fracture off of the fibula and talus.  She's been in a fracture boot weightbearing as tolerated.  She's been using Mobic which has not helped.              Review of Systems All systems reviewed are negative as they relate to the chief complaint within the history of present illness.  Patient denies  fevers or chills.    Assessment & Plan: Visit Diagnoses: No diagnosis found.  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  She does have some instability on examination.  Patient also has left ankle ligamentous injury which should heal over the next 5 weeks.  We will take her out of fracture boot put her into an ASO brace with stockinettes.  One time prescription for Tylenol 3.  Out of work for 14 more days then she should be able to return to work.  I will see her back as needed.  I want her using that brace for 5 more weeks.  Follow-Up Instructions: No Follow-up on file.   Orders:  No orders of the defined types were placed in this encounter.  No orders of the defined types were placed in this encounter.     Procedures: No procedures performed   Clinical Data: No additional findings.  Objective: Vital Signs: Ht 5\' 3"  (1.6 m)   Wt 225 lb (102.1 kg)   BMI 39.86 kg/m   Physical  Exam   Constitutional: Patient appears well-developed HEENT:  Head: Normocephalic Eyes:EOM are normal Neck: Normal range of motion Cardiovascular: Normal rate Pulmonary/chest: Effort normal Neurologic: Patient is alert Skin: Skin is warm Psychiatric: Patient has normal mood and affect    Ortho Exam orthopedic exam demonstrates bruising and ecchymosis on the lateral aspect of the left ankle.  She also has some medial sided tenderness.  Syndesmosis stable.  Slightly increased varus tilt testing left versus right.  Palpable intact nontender anterior to posterior tib peroneal and Achilles tendon on the left-hand side.  Pedal pulses palpable.  No other masses lymph adenopathy or skin changes noted in the left foot region.  Left knee also has anterior cruciate ligament deficiency with palpable pedal pulses and stable collateral ligaments.  Anterior Cruciate out.  Specialty Comments:  No specialty comments available.  Imaging: No results found.   PMFS History: Patient Active Problem List   Diagnosis Date Noted  . Viral gastroenteritis 10/11/2013  . Back abscess 10/06/2013  . Elevated TSH 08/29/2013  . Palpitations 08/05/2013  . Seasonal allergies 06/21/2012  . Migraine with aura 03/06/2011  . LOW BACK PAIN, CHRONIC 08/14/2009  . Moderate dysplasia of cervix 01/17/2008  . ACL TEAR 04/26/2007  .  OBESITY, NOS 04/30/2006  . BIPOLAR DISORDER 04/30/2006   Past Medical History:  Diagnosis Date  . Arthritis   . Back pain   . Depression   . Migraine   . Obesity   . UTI (urinary tract infection)     Family History  Problem Relation Age of Onset  . Alcohol abuse Father   . Cancer Maternal Grandmother     breast ca, cervical  . Cancer Paternal Grandmother     breast ca  . Breast cancer      pat gm's sister  . Colon cancer Neg Hx     Past Surgical History:  Procedure Laterality Date  . ADENOIDECTOMY    . 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  CESAREAN SECTION  08/20/2005  . INCISION AND DRAINAGE ABSCESS Right    with packing on right side  . MOUTH SURGERY    . NASAL SINUS SURGERY    . TONSILLECTOMY    . TUBAL LIGATION     Social History   Occupational History  . cashier [employer]   Social History Main Topics  . Smoking status: Former Smoker    Packs/day: 0.50    Quit date: 03/03/2010  . Smokeless tobacco: Never Used  . Alcohol use Yes     Comment: rare  . Drug use: No  . Sexual activity: Yes    Birth control/ protection: Surgical

## 2016-06-12 ENCOUNTER — Encounter (HOSPITAL_BASED_OUTPATIENT_CLINIC_OR_DEPARTMENT_OTHER): Payer: Self-pay

## 2016-06-12 ENCOUNTER — Emergency Department (HOSPITAL_BASED_OUTPATIENT_CLINIC_OR_DEPARTMENT_OTHER): Payer: Self-pay

## 2016-06-12 ENCOUNTER — Emergency Department (HOSPITAL_BASED_OUTPATIENT_CLINIC_OR_DEPARTMENT_OTHER)
Admission: EM | Admit: 2016-06-12 | Discharge: 2016-06-13 | Disposition: A | Discharge status: Home / Self Care | Payer: Self-pay | Attending: Emergency Medicine | Admitting: Emergency Medicine

## 2016-06-12 DIAGNOSIS — M542 Cervicalgia: Secondary | ICD-10-CM | POA: Insufficient documentation

## 2016-06-12 DIAGNOSIS — Z87891 Personal history of nicotine dependence: Secondary | ICD-10-CM | POA: Insufficient documentation

## 2016-06-12 MED ORDER — DIAZEPAM 5 MG PO TABS: 5.0000 mg | ORAL_TABLET | Freq: Two times a day (BID) | ORAL | 0 refills | Status: DC

## 2016-06-12 MED ORDER — NAPROXEN 500 MG PO TABS: 500.0000 mg | ORAL_TABLET | Freq: Two times a day (BID) | ORAL | 0 refills | Status: DC

## 2016-06-12 MED ORDER — DIAZEPAM 5 MG PO TABS
5.0000 mg | ORAL_TABLET | Freq: Once | ORAL | Status: AC
  Administered 2016-06-12: 5 mg via ORAL
  Filled 2016-06-12: qty 1

## 2016-06-12 NOTE — ED Notes (Signed)
ED Provider at bedside. 

## 2016-06-12 NOTE — ED Triage Notes (Signed)
c/o posterior neck pain x 1.5 months-pain started after a fall when she broke her ankle-NAD-steady gait

## 2016-06-12 NOTE — ED Provider Notes (Signed)
[facility] Provider Note   CSN: [id number] Arrival date & time: 06/12/16  2104  By signing my name below, I, Erika [name], attest that this documentation has been prepared under the direction and in the presence of non-physician practitioner, Etta Quill, NP. Electronically Signed: Dolores [name], Scribe. 06/12/2016. 10:17 PM.  History   Chief Complaint Chief Complaint  Patient presents with  . Neck Pain   The history is provided by the patient. No language interpreter was used.  Neck Injury  This is a recurrent problem. The current episode started more than 1 week ago. The problem occurs constantly. The problem has been gradually worsening. Nothing aggravates the symptoms. Nothing relieves the symptoms. Treatments tried: Flexeril. The treatment provided no relief.     HPI Comments:  Erika [name] is a 36 y.o. female who presents to the Emergency Department complaining of constant, worsening posterior neck pain onset >1 month ago.  She describes her pain as beginning just lateral to her spine and radiating to her shoulders. Her pain is exacerbated when she moves her head. Pt states that she fell and broke her ankle about a month and a half ago, which is when her pain began. She has tried Flexeril and OTC anti-inflammatories with no relief. Denies any weakness or numbness.  Past Medical History:  Diagnosis Date  . Arthritis   . Back pain   . Depression   . Migraine   . Obesity   . UTI (urinary tract infection)     Patient Active Problem List   Diagnosis Date Noted  . Moderate left ankle sprain 04/15/2016  . Viral gastroenteritis 10/11/2013  . Back abscess 10/06/2013  . Elevated TSH 08/29/2013  . Palpitations 08/05/2013  . Seasonal allergies 06/21/2012  . Migraine with aura 03/06/2011  . LOW BACK PAIN, CHRONIC 08/14/2009  . Moderate dysplasia of cervix 01/17/2008  . ACL TEAR 04/26/2007  . OBESITY, NOS 04/30/2006  . BIPOLAR DISORDER 04/30/2006    Past  Surgical History:  Procedure Laterality Date  . ADENOIDECTOMY    . 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 CESAREAN SECTION  08/20/2005  . INCISION AND DRAINAGE ABSCESS Right    with packing on right side  . MOUTH SURGERY    . NASAL SINUS SURGERY    . TONSILLECTOMY    . TUBAL LIGATION      OB History    No data available       Home Medications    Prior to Admission medications   Medication Sig Start Date End Date Taking? Authorizing Provider  acetaminophen-codeine (TYLENOL #3) 300-30 MG tablet Take 1 tablet by mouth every 6 (six) hours as needed for moderate pain. 04/15/16   Meredith Pel, MD  cyclobenzaprine (FLEXERIL) 10 MG tablet Take 10 mg by mouth 3 (three) times daily as needed for muscle spasms.    Historical Provider, MD  diphenhydrAMINE (BENADRYL) 25 MG tablet Take 1 tablet (25 mg total) by mouth every 6 (six) hours. Patient not taking: Reported on 04/15/2016 01/17/15   Okey Regal, PA-C  doxycycline (VIBRA-TABS) 100 MG tablet Take 1 tablet (100 mg total) by mouth 2 (two) times daily. Patient not taking: Reported on 04/15/2016 10/06/13   Renee A Kuneff, DO  fluticasone (FLONASE) 50 MCG/ACT nasal spray PLACE 2 SPRAYS INTO THE NOSE DAILY. Patient not taking: Reported on 04/15/2016    Dayarmys [place] de Gwendalyn Ege, MD  levothyroxine (SYNTHROID, LEVOTHROID) 25 MCG tablet TAKE  1 TABLET BY MOUTH DAILY BEFORE BREAKFAST. Patient not taking: Reported on 04/15/2016 11/08/13   Coral Spikes, DO  LORazepam (ATIVAN) 0.5 MG tablet Take 1 tablet (0.5 mg total) by mouth 2 (two) times daily as needed for anxiety. Patient not taking: Reported on 04/15/2016 08/05/13   Gerda Diss, DO  meloxicam (MOBIC) 15 MG tablet Take 1 tablet (15 mg total) by mouth daily. 04/09/16   April Palumbo, MD  naproxen sodium (ALEVE) 220 MG tablet Take 1 tablet (220 mg total) by mouth 2 (two) times daily with a meal. Patient not taking: Reported on 04/15/2016 01/01/16   Charlesetta Shanks, MD    ondansetron (ZOFRAN) 4 MG tablet Take 1 tablet (4 mg total) by mouth every 8 (eight) hours as needed for nausea or vomiting. Patient not taking: Reported on 04/15/2016 10/11/13   Renee A Kuneff, DO  oxyCODONE (OXY IR/ROXICODONE) 5 MG immediate release tablet Take 1 tablet (5 mg total) by mouth every 4 (four) hours as needed for severe pain. Patient not taking: Reported on 04/15/2016 08/05/13   Gerda Diss, DO  promethazine (PHENERGAN) 25 MG tablet Take 25 mg by mouth as needed for nausea or vomiting.    Historical Provider, MD  ranitidine (ZANTAC) 150 MG capsule Take 1 capsule (150 mg total) by mouth daily. Patient not taking: Reported on 04/15/2016 01/17/15   Okey Regal, PA-C  SUMAtriptan (IMITREX) 50 MG tablet Take 50 mg by mouth every 2 (two) hours as needed for migraine or headache. 1 pill at start of migraine symptoms. If no improvement 2 pills 2 hours later. 11/20/11   Donnamae Jude, MD  topiramate (TOPAMAX) 25 MG tablet Take 25 mg by mouth daily as needed.    Historical Provider, MD    Family History Family History  Problem Relation Age of Onset  . Alcohol abuse Father   . Cancer Maternal Grandmother     breast ca, cervical  . Cancer Paternal Grandmother     breast ca  . Breast cancer      pat gm's sister  . Colon cancer Neg Hx     Social History Social History  Substance Use Topics  . Smoking status: Former Smoker    Packs/day: 0.50    Quit date: 03/03/2010  . Smokeless tobacco: Never Used  . Alcohol use Yes     Comment: rare     Allergies   Patient has no known allergies.   Review of Systems Review of Systems  Musculoskeletal: Positive for myalgias and neck pain.  Neurological: Negative for weakness and numbness.  All other systems reviewed and are negative.    Physical Exam Updated Vital Signs BP 110/67 (BP Location: Left Arm)   Pulse 78   Temp 98.3 F (36.8 C) (Oral)   Resp 18   Ht 5\' 3"  (1.6 m)   Wt 230 lb (104.3 kg)   LMP 06/06/2016   SpO2 98%    BMI 40.74 kg/m   Physical Exam  Constitutional: She is oriented to person, place, and time. She appears well-developed and well-nourished. No distress.  HENT:  Head: Normocephalic and atraumatic.  Eyes: EOM are normal.  Neck: Normal range of motion.  Cardiovascular: Normal rate, regular rhythm and normal heart sounds.   Pulmonary/Chest: Effort normal and breath sounds normal.  Abdominal: Soft. She exhibits no distension. There is no tenderness.  Musculoskeletal: Normal range of motion.  Left lateral neck discomfort radiating into left shoulder. No strength deficits noted.   Neurological: She  is alert and oriented to person, place, and time.  Skin: Skin is warm and dry.  Psychiatric: She has a normal mood and affect. Judgment normal.  Nursing note and vitals reviewed.    ED Treatments / Results  DIAGNOSTIC STUDIES:  Oxygen Saturation is 98% on RA, normal by my interpretation.    COORDINATION OF CARE:  10:32 PM Discussed treatment plan with pt at bedside which includes muscle relaxants and pt agreed to plan.  Labs (all labs ordered are listed, but only abnormal results are displayed) Labs Reviewed - No data to display  EKG  EKG Interpretation None       Radiology No results found.  Procedures Procedures (including critical care time)  Medications Ordered in ED Medications - No data to display   Initial Impression / Assessment and Plan / ED Course  I have reviewed the triage vital signs and the nursing notes.  Pertinent labs & imaging results that were available during my care of the patient were reviewed by me and considered in my medical decision making (see chart for details).     Neck pain  Pt presents with aching pain and muscle spasms of the neck.  Patient X-Ray negative for obvious fracture or dislocation. Pt advised to follow up with orthopedics if symptoms persist for further evaluation if conservative therapies do not work. Recommended PT,  exercise, and NSAID/muscle relaxant.  Return precaurtions discussed. Patient will be dc home & is agreeable with above plan.   Final Clinical Impressions(s) / ED Diagnoses   Final diagnoses:  Neck pain    New Prescriptions New Prescriptions   DIAZEPAM (VALIUM) 5 MG TABLET    Take 1 tablet (5 mg total) by mouth 2 (two) times daily.   NAPROXEN (NAPROSYN) 500 MG TABLET    Take 1 tablet (500 mg total) by mouth 2 (two) times daily.    I personally performed the services described in this documentation, which was scribed in my presence. The recorded information has been reviewed and is accurate.     Etta Quill, NP 06/13/16 0002    Fatima Blank, MD 06/13/16 [phone]

## 2016-10-15 ENCOUNTER — Encounter: Payer: Self-pay | Admitting: Internal Medicine

## 2016-10-15 ENCOUNTER — Ambulatory Visit (INDEPENDENT_AMBULATORY_CARE_PROVIDER_SITE_OTHER): Payer: Commercial Managed Care - PPO | Admitting: Internal Medicine

## 2016-10-15 VITALS — BP 106/68 | HR 74 | Temp 97.8°F | Wt 215.0 lb

## 2016-10-15 DIAGNOSIS — R300 Dysuria: Secondary | ICD-10-CM | POA: Diagnosis not present

## 2016-10-15 DIAGNOSIS — N898 Other specified noninflammatory disorders of vagina: Secondary | ICD-10-CM | POA: Insufficient documentation

## 2016-10-15 DIAGNOSIS — E039 Hypothyroidism, unspecified: Secondary | ICD-10-CM | POA: Diagnosis not present

## 2016-10-15 DIAGNOSIS — M2550 Pain in unspecified joint: Secondary | ICD-10-CM | POA: Diagnosis not present

## 2016-10-15 LAB — POCT URINALYSIS DIP (MANUAL ENTRY)
BILIRUBIN UA: NEGATIVE
GLUCOSE UA: NEGATIVE mg/dL
Leukocytes, UA: NEGATIVE
Nitrite, UA: NEGATIVE
Protein Ur, POC: NEGATIVE mg/dL
Spec Grav, UA: 1.025 (ref 1.010–1.025)
Urobilinogen, UA: 0.2 E.U./dL
pH, UA: 5.5 (ref 5.0–8.0)

## 2016-10-15 LAB — POCT UA - MICROSCOPIC ONLY

## 2016-10-15 MED ORDER — MELOXICAM 15 MG PO TABS: 15.0000 mg | ORAL_TABLET | Freq: Every day | ORAL | 2 refills | Status: DC

## 2016-10-15 MED ORDER — METRONIDAZOLE 500 MG PO TABS: 500.0000 mg | ORAL_TABLET | Freq: Two times a day (BID) | ORAL | 0 refills | Status: DC

## 2016-10-15 MED ORDER — MELOXICAM 15 MG PO TABS
15.0000 mg | ORAL_TABLET | Freq: Every day | ORAL | 2 refills | Status: DC
Start: 2016-10-15 — End: 2016-10-15
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
## 2016-10-15 NOTE — Patient Instructions (Signed)
It was nice meeting you today Erika [name]!  Please begin taking metronidazole (Flagyl) one 500mg  tablet twice a day for the next seven days to treat BV. You did not have any signs of infection on your urine sample, so you do not have a UTI.   For your plantar fasciitis and joint pains, you can begin taking one meloxicam (Mobic) tablet a day. DO NOT take ibuprofen or similar medicines (Aleve, Advil) while taking Mobic.   Once we have the results of your thyroid test, I will call to let you know if you need to start taking Synthroid again.   If you have any questions or concerns, please feel free to call the clinic.   Be well,  Dr. Avon Gully

## 2016-10-15 NOTE — Assessment & Plan Note (Signed)
UTI without signs of infection, so symptoms more likely 2/2 BV. As patient with prior BV infections and familiar with symptoms, and reporting symptoms most consistent with BV, will treat empirically.  - Flagyl 500mg  BID x7d

## 2016-10-15 NOTE — Assessment & Plan Note (Signed)
Reports prior diagnosis of arthritis in bilateral knees and lower back. Now reporting pain in shoulders as well. Knee pain likely 2/2 at least in part to obesity, as well as standing at work for extended periods of time all day. Likely contributing to back pain as well. No abnormalities on MSK exam. Slightly limited overhead ROM in shoulders but patient with 5/5 strength upper and lower extremities bilaterally. Overall MSK exam benign. Will treat conservatively with meloxicam and continue to monitor.  - Meloxicam 15mg  qd

## 2016-10-16 LAB — TSH: TSH: 2.23 u[IU]/mL (ref 0.450–4.500)

## 2016-10-17 ENCOUNTER — Telehealth: Payer: Self-pay | Admitting: Internal Medicine

## 2016-10-17 NOTE — Telephone Encounter (Signed)
Attempted to call patient to discuss results of TSH. No answer and patient has not set up voicemail so unable to leave message. As TSH is WNL and patient has not been taking Synthroid, she does not need to begin taking Synthroid again.   Adin Hector, MD, MPH PGY-3 [facility] Pager [phone]

## 2016-12-16 ENCOUNTER — Encounter: Payer: Self-pay | Admitting: Podiatry

## 2016-12-16 ENCOUNTER — Ambulatory Visit (INDEPENDENT_AMBULATORY_CARE_PROVIDER_SITE_OTHER): Payer: Commercial Managed Care - PPO

## 2016-12-16 ENCOUNTER — Ambulatory Visit (INDEPENDENT_AMBULATORY_CARE_PROVIDER_SITE_OTHER): Payer: Commercial Managed Care - PPO | Admitting: Podiatry

## 2016-12-16 VITALS — BP 118/68 | HR 76 | Resp 16

## 2016-12-16 DIAGNOSIS — M778 Other enthesopathies, not elsewhere classified: Secondary | ICD-10-CM

## 2016-12-16 DIAGNOSIS — M7751 Other enthesopathy of right foot: Secondary | ICD-10-CM

## 2016-12-16 DIAGNOSIS — M722 Plantar fascial fibromatosis: Secondary | ICD-10-CM

## 2016-12-16 DIAGNOSIS — M779 Enthesopathy, unspecified: Principal | ICD-10-CM

## 2016-12-16 MED ORDER — METHYLPREDNISOLONE 4 MG PO TBPK
ORAL_TABLET | ORAL | 0 refills | Status: DC
Start: 2016-12-16 — End: 2017-03-22

## 2016-12-16 MED ORDER — MELOXICAM 15 MG PO TABS: 15.0000 mg | ORAL_TABLET | Freq: Every day | ORAL | 3 refills | Status: DC

## 2016-12-16 NOTE — Progress Notes (Signed)
Subjective:  Patient ID: Erika [name], female    DOB: May 07, 1980,  MRN: [id number] HPI Chief Complaint  Patient presents with  . Foot Pain    Plantar and medial heel right - aching x 5-6 months, AM pain, went to ER-said plantar fasciitis, Rx'd meloxicam, tried stretching and OTC inserts-no help, getting worse    36 y.o. female presents with the above complaint.     Past Medical History:  Diagnosis Date  . Arthritis   . Back pain   . Depression   . Migraine   . Obesity   . UTI (urinary tract infection)    Past Surgical History:  Procedure Laterality Date  . ADENOIDECTOMY    . 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 CESAREAN SECTION  08/20/2005  . INCISION AND DRAINAGE ABSCESS Right    with packing on right side  . MOUTH SURGERY    . NASAL SINUS SURGERY    . TONSILLECTOMY    . TUBAL LIGATION      Current Outpatient Prescriptions:  .  fluticasone (FLONASE) 50 MCG/ACT nasal spray, PLACE 2 SPRAYS INTO THE NOSE DAILY. (Patient not taking: Reported on 04/15/2016), Disp: 16 g, Rfl: 6 .  levothyroxine (SYNTHROID, LEVOTHROID) 25 MCG tablet, TAKE 1 TABLET BY MOUTH DAILY BEFORE BREAKFAST. (Patient not taking: Reported on 04/15/2016), Disp: 90 tablet, Rfl: 0  No Known Allergies Review of Systems  All other systems reviewed and are negative.  Objective:   Vitals:   12/16/16 1600  BP: 118/68  Pulse: 76  Resp: 16    General: Well developed, nourished, in no acute distress, alert and oriented x3   Dermatological: Skin is warm, dry and supple bilateral. Nails x 10 are well maintained; remaining integument appears unremarkable at this time. There are no open sores, no preulcerative lesions, no rash or signs of infection present.  Vascular: Dorsalis Pedis artery and Posterior Tibial artery pedal pulses are 2/4 bilateral with immedate capillary fill time. Pedal hair growth present. No varicosities and no lower extremity edema present bilateral.   Neruologic:  Grossly intact via light touch bilateral. Vibratory intact via tuning fork bilateral. Protective threshold with Semmes Wienstein monofilament intact to all pedal sites bilateral. Patellar and Achilles deep tendon reflexes 2+ bilateral. No Babinski or clonus noted bilateral.   Musculoskeletal: No gross boney pedal deformities bilateral. No pain, crepitus, or limitation noted with foot and ankle range of motion bilateral. Muscular strength 5/5 in all groups tested bilateral. She has pain on palpation medial continued tubercle of the right heel. Mild erythema mild warmth to the area. No pain on medial and lateral compression of the calcaneus.  Gait: Unassisted, Nonantalgic.    Radiographs:  3 views right foot taken today demonstrate soft tissue increase in density of the plantar fascia calcaneal insertion site. No fractures identified.  Assessment & [name]:   Assessment: Plantar fasciitis right chronic in nature.  [name]: Discussed etiology pathology conservative versus surgical therapies. At this point we discussed injection therapy. 20 mg of local anesthetic and Kenalog were injected into the one maximal tenderness of the right heel. She's place and plantar fascia brace to be followed by a night splint at bedtime. We also discussed proper shoe gear stretching exercises ice therapy issues or modifications. I also started her on a Medrol Dosepak to be followed by meloxicam. More than likely we will have to get her into a pair of orthotics. She was given both oral and written  home-going instructions for stretching and ice therapy.     Morrison Masser T. [place], [place]

## 2016-12-16 NOTE — Patient Instructions (Signed)

## 2017-01-20 ENCOUNTER — Ambulatory Visit: Payer: Commercial Managed Care - PPO | Admitting: Podiatry

## 2017-03-05 ENCOUNTER — Other Ambulatory Visit: Payer: Self-pay

## 2017-03-05 ENCOUNTER — Ambulatory Visit (INDEPENDENT_AMBULATORY_CARE_PROVIDER_SITE_OTHER): Payer: Commercial Managed Care - PPO | Admitting: Family Medicine

## 2017-03-05 ENCOUNTER — Encounter: Payer: Self-pay | Admitting: Family Medicine

## 2017-03-05 VITALS — BP 120/64 | HR 95 | Temp 97.9°F | Ht 63.0 in | Wt 210.4 lb

## 2017-03-05 DIAGNOSIS — R350 Frequency of micturition: Secondary | ICD-10-CM | POA: Diagnosis not present

## 2017-03-05 DIAGNOSIS — R3989 Other symptoms and signs involving the genitourinary system: Secondary | ICD-10-CM | POA: Diagnosis not present

## 2017-03-05 DIAGNOSIS — Z23 Encounter for immunization: Secondary | ICD-10-CM

## 2017-03-05 LAB — POCT URINALYSIS DIP (MANUAL ENTRY)
Bilirubin, UA: NEGATIVE
Glucose, UA: NEGATIVE mg/dL
Ketones, POC UA: NEGATIVE mg/dL
Leukocytes, UA: NEGATIVE
Nitrite, UA: NEGATIVE
Protein Ur, POC: NEGATIVE mg/dL
Spec Grav, UA: 1.015
Urobilinogen, UA: 0.2 U/dL
pH, UA: 5.5

## 2017-03-05 LAB — POCT UA - MICROSCOPIC ONLY

## 2017-03-05 MED ORDER — CEPHALEXIN 500 MG PO CAPS: 500.0000 mg | ORAL_CAPSULE | Freq: Two times a day (BID) | ORAL | 0 refills | Status: DC

## 2017-03-05 NOTE — Assessment & Plan Note (Signed)
Despite normal UA seems most consistent with a UTI.  Will treat for this.  If not improving should be rechecked

## 2017-03-05 NOTE — Patient Instructions (Signed)
Good to see you today!  Thanks for coming in.  We will treat you for a UTI - Keflex 500 mg twice a day  You should feel better in 2 day if you do not or if you have other symptoms like high fever or nausea and vomiting then contact us  Come back for a PAP smear with Dr Avon Gully

## 2017-03-05 NOTE — Progress Notes (Signed)
Subjective  Patient is presenting with the following illnesses  URINARY PRESSURE Had diarrhea for 2 days that stopped yesterday but today has had feeling of pressure - having to pee but can't and then only small amount.  This is her usual UTI symptoms  No nausea and vomiting or fever.  Has usual back pressure with a UTI.  Last was a few years ago   Chief Complaint noted Review of Symptoms - see HPI PMH - Smoking status noted.  Bipolar, Migraine   Objective Vital Signs reviewed Alert nad No CVAT  Abdomen: soft and non-tender without masses, organomegaly or hernias noted.  No guarding or rebound  UA Noted     Assessments/Plans  Sensation of pressure in bladder area Despite normal UA seems most consistent with a UTI.  Will treat for this.  If not improving should be rechecked    See after visit summary for details of patient instuctions

## 2017-03-22 ENCOUNTER — Emergency Department (HOSPITAL_BASED_OUTPATIENT_CLINIC_OR_DEPARTMENT_OTHER)
Admission: EM | Admit: 2017-03-22 | Discharge: 2017-03-22 | Disposition: A | Discharge status: Home / Self Care | Payer: Commercial Managed Care - PPO | Attending: Emergency Medicine | Admitting: Emergency Medicine

## 2017-03-22 ENCOUNTER — Encounter (HOSPITAL_BASED_OUTPATIENT_CLINIC_OR_DEPARTMENT_OTHER): Payer: Self-pay | Admitting: Emergency Medicine

## 2017-03-22 ENCOUNTER — Other Ambulatory Visit: Payer: Self-pay

## 2017-03-22 DIAGNOSIS — Z79899 Other long term (current) drug therapy: Secondary | ICD-10-CM | POA: Insufficient documentation

## 2017-03-22 DIAGNOSIS — Z87891 Personal history of nicotine dependence: Secondary | ICD-10-CM | POA: Insufficient documentation

## 2017-03-22 DIAGNOSIS — L03032 Cellulitis of left toe: Secondary | ICD-10-CM | POA: Insufficient documentation

## 2017-03-22 DIAGNOSIS — M79674 Pain in right toe(s): Secondary | ICD-10-CM | POA: Diagnosis present

## 2017-03-22 MED ORDER — MUPIROCIN CALCIUM 2 % EX CREA
TOPICAL_CREAM | Freq: Once | CUTANEOUS | Status: DC
  Filled 2017-03-22: qty 15

## 2017-03-22 MED ORDER — SULFAMETHOXAZOLE-TRIMETHOPRIM 800-160 MG PO TABS
1.0000 | ORAL_TABLET | Freq: Once | ORAL | Status: AC
  Administered 2017-03-22: 1 via ORAL
  Filled 2017-03-22: qty 1

## 2017-03-22 MED ORDER — MUPIROCIN 2 % EX OINT
TOPICAL_OINTMENT | CUTANEOUS | Status: AC
  Administered 2017-03-22: 1
  Filled 2017-03-22: qty 22

## 2017-03-22 MED ORDER — SULFAMETHOXAZOLE-TRIMETHOPRIM 800-160 MG PO TABS: 1.0000 | ORAL_TABLET | Freq: Two times a day (BID) | ORAL | 0 refills | Status: AC

## 2017-03-22 MED ORDER — MUPIROCIN CALCIUM 2 % EX CREA: 1.0000 "application " | TOPICAL_CREAM | Freq: Two times a day (BID) | CUTANEOUS | 0 refills | Status: DC

## 2017-03-22 MED ORDER — LIDOCAINE HCL (PF) 1 % IJ SOLN
10.0000 mL | Freq: Once | INTRAMUSCULAR | Status: AC
Start: 2017-03-22 — End: 2017-03-22
  Administered 2017-03-22: 10 mL
  Filled 2017-03-22: qty 10

## 2017-03-22 NOTE — ED Triage Notes (Signed)
Pt reports L great toe wound following a pedicure on 1/7. Pt reports pain and drainage.

## 2017-03-22 NOTE — ED Notes (Addendum)
Bactroban ointment applied to left great toe, not right, 4x4 x 2 applied and wrapped with coban tape

## 2017-03-22 NOTE — ED Notes (Signed)
ED Provider at bedside. 

## 2017-03-22 NOTE — ED Provider Notes (Signed)
[facility] DEPART[facility] Provider Note   CSN: [id number] Arrival date & time: 03/22/17  0710     History   Chief Complaint Chief Complaint  Patient presents with  . Wound Check    HPI Erika [name] is a 37 y.o. female.  HPI  37 year old female presents with a right toe infection.  On 1/7 she had a pedicure.  2 days later she started noticing redness and pain in her great toe.  Went back to the place she got a pedicure and they removed the substance placed.  Since then, her symptoms have been slowly worsening.  She tried some Neosporin and alcohol.  Since today has noticed some drainage around her toe.  The pain is mostly lateral.  No fevers or systemic symptoms.  Past Medical History:  Diagnosis Date  . Arthritis   . Back pain   . Depression   . Migraine   . Obesity   . UTI (urinary tract infection)     Patient Active Problem List   Diagnosis Date Noted  . Sensation of pressure in bladder area 03/05/2017  . Joint pain 10/15/2016  . Vaginal irritation 10/15/2016  . Moderate left ankle sprain 04/15/2016  . Viral gastroenteritis 10/11/2013  . Back abscess 10/06/2013  . Elevated TSH 08/29/2013  . Palpitations 08/05/2013  . Seasonal allergies 06/21/2012  . Migraine with aura 03/06/2011  . LOW BACK PAIN, CHRONIC 08/14/2009  . Moderate dysplasia of cervix 01/17/2008  . ACL TEAR 04/26/2007  . OBESITY, NOS 04/30/2006  . BIPOLAR DISORDER 04/30/2006    Past Surgical History:  Procedure Laterality Date  . ADENOIDECTOMY    . 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 CESAREAN SECTION  08/20/2005  . INCISION AND DRAINAGE ABSCESS Right    with packing on right side  . MOUTH SURGERY    . NASAL SINUS SURGERY    . TONSILLECTOMY    . TUBAL LIGATION      OB History    No data available       Home Medications    Prior to Admission medications   Medication Sig Start Date End Date Taking? Authorizing Provider  levothyroxine  (SYNTHROID, LEVOTHROID) 25 MCG tablet TAKE 1 TABLET BY MOUTH DAILY BEFORE BREAKFAST. Patient not taking: Reported on 04/15/2016 11/08/13   Coral Spikes, DO  meloxicam (MOBIC) 15 MG tablet Take 1 tablet (15 mg total) by mouth daily. 12/16/16   Hyatt, Max T, DPM  mupirocin cream (BACTROBAN) 2 % Apply 1 application topically 2 (two) times daily. 03/22/17   Sherwood Gambler, MD  sulfamethoxazole-trimethoprim (BACTRIM DS,SEPTRA DS) 800-160 MG tablet Take 1 tablet by mouth 2 (two) times daily for 5 days. 03/22/17 03/27/17  Sherwood Gambler, MD    Family History Family History  Problem Relation Age of Onset  . Alcohol abuse Father   . Cancer Maternal Grandmother        breast ca, cervical  . Cancer Paternal Grandmother        breast ca  . Breast cancer Unknown        pat gm's sister  . Colon cancer Neg Hx     Social History Social History   Tobacco Use  . Smoking status: Former Smoker    Packs/day: 0.50    Last attempt to quit: 03/03/2010    Years since quitting: 7.0  . Smokeless tobacco: Never Used  Substance Use Topics  . Alcohol use: Yes  Comment: rare  . Drug use: No     Allergies   Patient has no known allergies.   Review of Systems Review of Systems  Constitutional: Negative for fever.  Musculoskeletal: Positive for arthralgias.  Skin: Positive for color change and wound.  All other systems reviewed and are negative.    Physical Exam Updated Vital Signs BP 130/69 (BP Location: Left Arm)   Pulse 78   Temp 98.2 F (36.8 C) (Oral)   Resp 18   Ht 5\' 3"  (1.6 m)   Wt 95.3 kg (210 lb)   LMP 03/21/2017   SpO2 99%   BMI 37.20 kg/m   Physical Exam  Constitutional: She is oriented to person, place, and time. She appears well-developed and well-nourished. No distress.  HENT:  Head: Normocephalic and atraumatic.  Right Ear: External ear normal.  Left Ear: External ear normal.  Nose: Nose normal.  Eyes: Right eye exhibits no discharge. Left eye exhibits no discharge.    Cardiovascular: Normal rate and regular rhythm.  Pulses:      Dorsalis pedis pulses are 2+ on the right side.  Pulmonary/Chest: Effort normal.  Musculoskeletal:       Right foot: There is normal range of motion and no swelling.  See picture. Mild erythema around distal great toe. Some drainage to lateral aspect. Tenderness but minimal to no fullness to lateral great toe. No tenderness or swelling to plantar or medial toe. No streaking  Feet:  Right Foot:  Skin Integrity: Positive for erythema.  Neurological: She is alert and oriented to person, place, and time.  Skin: Skin is warm and dry. She is not diaphoretic.  Nursing note and vitals reviewed.      ED Treatments / Results  Labs (all labs ordered are listed, but only abnormal results are displayed) Labs Reviewed - No data to display  EKG  EKG Interpretation None       Radiology No results found.  Procedures .Marland KitchenIncision and Drainage Date/Time: 03/22/2017 8:17 AM Performed by: Sherwood Gambler, MD Authorized by: Sherwood Gambler, MD   Consent:    Consent obtained:  Verbal   Consent given by:  Patient   Risks discussed:  Bleeding, incomplete drainage, infection and pain   Alternatives discussed:  Alternative treatment Location:    Type:  Abscess (paronychia)   Size:  <1 cm   Location:  Lower extremity   Lower extremity location:  Toe   Toe location:  L big toe Pre-procedure details:    Skin preparation:  Betadine Anesthesia (see MAR for exact dosages):    Anesthesia method:  Nerve block   Block location:  Digital block   Block anesthetic:  Lidocaine 1% w/o epi   Block injection procedure:  Anatomic landmarks identified, introduced needle and negative aspiration for blood   Block outcome:  Anesthesia achieved Procedure type:    Complexity:  Simple Procedure details:    Incision types:  Stab incision   Scalpel blade:  11   Drainage:  Bloody   Drainage amount:  Scant   Wound treatment:  Wound left open    Packing materials:  None Post-procedure details:    Patient tolerance of procedure:  Tolerated well, no immediate complications   (including critical care time)  Medications Ordered in ED Medications  sulfamethoxazole-trimethoprim (BACTRIM DS,SEPTRA DS) 800-160 MG per tablet 1 tablet (not administered)  mupirocin cream (BACTROBAN) 2 % (not administered)  lidocaine (PF) (XYLOCAINE) 1 % injection 10 mL (10 mLs Infiltration Given 03/22/17 0736)  Initial Impression / Assessment and Plan / ED Course  I have reviewed the triage vital signs and the nursing notes.  Pertinent labs & imaging results that were available during my care of the patient were reviewed by me and considered in my medical decision making (see chart for details).     Patient likely has paronychia versus cellulitis.  There is no evidence of a felon.  Incision and drainage attempted with mostly bloody return.  Placed on topical and oral antibiotics.  Follow-up with podiatry and discussed return precautions.  Final Clinical Impressions(s) / ED Diagnoses   Final diagnoses:  Acute paronychia of toe of left foot    ED Discharge Orders        Ordered    sulfamethoxazole-trimethoprim (BACTRIM DS,SEPTRA DS) 800-160 MG tablet  2 times daily     03/22/17 0816    mupirocin cream (BACTROBAN) 2 %  2 times daily     03/22/17 [id number]       Sherwood Gambler, MD 03/22/17 [phone]

## 2017-06-10 ENCOUNTER — Telehealth: Payer: Self-pay

## 2017-06-10 NOTE — Telephone Encounter (Signed)
Pt called nurse clinic states she has been sick for last 3 days with cough and hoarse voice. Wanting appointment for today. Advised we did not have any open appointments for today but offered her over flow or could go to urgent care. Pt declined overflow visit. Wallace Cullens, RN

## 2017-06-18 ENCOUNTER — Other Ambulatory Visit: Payer: Self-pay

## 2017-06-18 ENCOUNTER — Ambulatory Visit: Payer: Commercial Managed Care - PPO | Admitting: Internal Medicine

## 2017-06-18 ENCOUNTER — Encounter: Payer: Self-pay | Admitting: Internal Medicine

## 2017-06-18 DIAGNOSIS — L259 Unspecified contact dermatitis, unspecified cause: Secondary | ICD-10-CM | POA: Insufficient documentation

## 2017-06-18 DIAGNOSIS — W540XXA Bitten by dog, initial encounter: Secondary | ICD-10-CM | POA: Insufficient documentation

## 2017-06-18 DIAGNOSIS — W540XXD Bitten by dog, subsequent encounter: Secondary | ICD-10-CM | POA: Diagnosis not present

## 2017-06-18 MED ORDER — TRIAMCINOLONE ACETONIDE 0.1 % EX OINT: 1.0000 "application " | TOPICAL_OINTMENT | Freq: Two times a day (BID) | CUTANEOUS | 0 refills | Status: DC

## 2017-06-18 MED ORDER — MELOXICAM 15 MG PO TABS: 15.0000 mg | ORAL_TABLET | Freq: Every day | ORAL | 1 refills | Status: DC

## 2017-06-18 NOTE — Assessment & Plan Note (Signed)
Of R dorsal hand. Seems related to recent glove use at work, however is only affecting part of her hand, and would expect all of hand to be involved if truly related to glove. Patient does report sweating a lot into the glove, and wonders if this may be worsening the issue. Lesions are pustular but are not similar in appearance to shingles, and also are not consistent in distribution with shingles. Will prescribe topical steroid to improve symptoms. Also suggested trying different gloves, or using powder in gloves to help with sweating. Encouraged patient to call if no improvement or worsening.

## 2017-06-18 NOTE — Assessment & Plan Note (Signed)
Currently taking Augmentin and received Tdap at ED on night of injury. Animal Control has been contacted and dogs are in quarantine. No signs of infection on exam and pain well-controlled with Mobic. Encouraged patient to complete abx course, and gave strict return precautions.

## 2017-06-18 NOTE — Progress Notes (Signed)
Subjective:   Patient: Erika [name]       Birthdate: Oct 03, 1980       MRN: [id number]      HPI  Erika [name] is a 37 y.o. female presenting for dog bite f/u and hand rash.   Dog bite Occurred 4d ago. Patient was standing in her neighbor's yard when two dogs (pit bull boxer mixes) belonging to another neighbor attacked her. One bit the back of her R thigh. The other dog attempted to bite her face. She put her R hand in front of her face, and the dog attempted to bite that arm instead. EMS and [facility] PD presented to scene and encouraged patient to be seen at ED and to contact Animal Control. She did not file a police report. She did go to [facility] ED that evening, where she was given a tetanus booster and prescribed Augmentin. She called Animal Control the next morning, who have taken the dogs into custody for quarantine.  Patient says that the bite on her leg is healing well and she has a large bruise rather than a true bite on her R arm that is rather painful. Denies drainage from site of leg wound, however wound is on back of her leg so she has difficulty seeing it. Has been taking Augmentin as prescribed. Has been taking Mobic for pain which helps.   Hand rash On dorsal surface of R hand. Began when she started having to wear gloves at work at [employer]. Gloves are powder-free Latex-free. She does not have similar rash on other hand. Rash improves if she does not have to work for a few days, then returns when she goes back to work. Rash appears like blisters at times and can be very itchy and painful. She has put mupirocin on it which was prescribed for her for paronychia, but this has not been helpful.   Smoking status reviewed. Patient is former smoker.   Review of Systems See HPI.     Objective:  Physical Exam  Constitutional: She is oriented to person, place, and time. She appears well-developed and well-nourished. No distress.  HENT:  Head: Normocephalic and  atraumatic.  Neck: Normal range of motion.  Cardiovascular: Normal rate.  Pulmonary/Chest: Effort normal. No respiratory distress.  Musculoskeletal: Normal range of motion.  Neurological: She is alert and oriented to person, place, and time.  Skin:  3 puncture wounds present on posterior surface of R upper leg below buttocks. No induration or surrounding erythema. No drainage or purulence. Mildly tender to palpation. Surrounding ecchymoses.   Hematoma ~2cm in diameter on R forearm at site of attempted dog bite. No skin breaks. Mildly TTP.   4 small erythematous pustular lesions present on dorsal surface of R hand. No lesions elsewhere on R hand or L hand.   Psychiatric: She has a normal mood and affect. Her behavior is normal.    Assessment & Plan:  Dog bite Currently taking Augmentin and received Tdap at ED on night of injury. Animal Control has been contacted and dogs are in quarantine. No signs of infection on exam and pain well-controlled with Mobic. Encouraged patient to complete abx course, and gave strict return precautions.   Contact dermatitis Of R dorsal hand. Seems related to recent glove use at work, however is only affecting part of her hand, and would expect all of hand to be involved if truly related to glove. Patient does report sweating a lot into the glove, and  wonders if this may be worsening the issue. Lesions are pustular but are not similar in appearance to shingles, and also are not consistent in distribution with shingles. Will prescribe topical steroid to improve symptoms. Also suggested trying different gloves, or using powder in gloves to help with sweating. Encouraged patient to call if no improvement or worsening.    Tarri Abernethy, MD, MPH PGY-3 [facility] Pager [phone]

## 2017-06-18 NOTE — Patient Instructions (Signed)
It was nice seeing you again today Erika [name]!  Please continue taking the antibiotic (Augmentin) until you have completed the full course. If you have worsening pain at the site of the dog bite, or if you notice pus or drainage coming from the site, please let us know. Also let us know if you develop fevers, chills, nausea, vomiting, or abdominal pain. Please also try to continuing to get in touch with Animal Control. For pain you can continue to take Mobic as you have been. You can also take Tyelnol if Mobic alone is not controlling your pain.   For the rash on your hand, you can use the Kenalog steroid ointment on the area up to twice a day. If it does not improve with this, please let us know.   If you have any questions or concerns, please feel free to call the clinic.   Be well,  Dr. Avon Gully

## 2017-06-23 ENCOUNTER — Other Ambulatory Visit: Payer: Self-pay | Admitting: Family Medicine

## 2017-06-23 ENCOUNTER — Telehealth: Payer: Self-pay

## 2017-06-23 MED ORDER — DOXYCYCLINE HYCLATE 100 MG PO CAPS: 100.0000 mg | ORAL_CAPSULE | Freq: Two times a day (BID) | ORAL | 0 refills | Status: AC

## 2017-06-23 NOTE — Telephone Encounter (Signed)
Covering inbox for Dr. Avon Gully.  Sent in 5 day course of doxycycline for prophylaxis against animal bite. If patient develops fevers, chills, drainage from wounds she needs to be seen again. Please tell her to take doxycycline on full stomach to avoid nausea.   She should reach out to animal control office associated with the incidence to discuss concern for lack of shots, specifically rabies concern. Thanks.  Lucila Maine, DO PGY-2, [facility] 06/23/2017 12:36 PM

## 2017-06-23 NOTE — Telephone Encounter (Signed)
Pt calling states that the Augmentin is making her very sick to her stomach. Having diarrhea, stomach pain and nausea/dry heaving. Wanting to know if we can change to something else since she still has 5 days left to take. Also wanted to let MD know she found out the dog that bit her has never had any of its shots and is concerned. Her call back [place] Wallace Cullens, RN

## 2017-06-24 ENCOUNTER — Telehealth: Payer: Self-pay | Admitting: Internal Medicine

## 2017-06-24 NOTE — Telephone Encounter (Signed)
Medicine she is taking for the dog bite is giving her diarhea and making her sick.  She needs to know what she can do for that?  Please call patient to discuss what she needs for out of work letter from when she was seen last week.  Best number for her today is [phone].  She must be called today if at all possible because no one has called her back yet.

## 2017-06-24 NOTE — Telephone Encounter (Signed)
Patient left message on nurse line stating she has not heard back. Called patient back at [phone] which is number that was left and is number in chart. No answer, only a message that "voicmail not set up yet".  Danley Danker, RN [facility] RN)

## 2017-06-25 NOTE — Telephone Encounter (Signed)
Attempted to return call to Ms. [place]. No answer at number, unable to leave voicemail.  If/when she calls back, I would advise her to stop antibiotics if she is having side effects. They were given as prophylaxis for dog bite. We will observe her off antibiotics. If she developed redness at wound or drainage she should come in to be seen.  Lucila Maine, DO PGY-2, [facility] 06/25/2017 8:51 AM

## 2017-06-25 NOTE — Telephone Encounter (Signed)
Spoke with patient. She will stop Augmentin. She is very concerned because dog has had no shots. Animal control is involved. Encouraged her to reach out to animal control with questions. Also expresses a lot of anxiety related to dog attack. Requests appt to speak with PCP about this. Appt made. Danley Danker, RN [facility] RN)

## 2017-06-25 NOTE — Telephone Encounter (Signed)
Patient made aware today after calling back in. Was told that she also could stop antibiotic totally and be observed as per other encounter on 4.25. Patient still expresses concern with dog vaccine status. Encouraged to reach out to animal control. Appt made to discuss anxiety from dog attack with PCP. Danley Danker, RN [facility] RN)

## 2017-06-29 ENCOUNTER — Other Ambulatory Visit: Payer: Self-pay

## 2017-06-29 ENCOUNTER — Encounter: Payer: Self-pay | Admitting: Internal Medicine

## 2017-06-29 ENCOUNTER — Ambulatory Visit (INDEPENDENT_AMBULATORY_CARE_PROVIDER_SITE_OTHER): Payer: Commercial Managed Care - PPO | Admitting: Internal Medicine

## 2017-06-29 DIAGNOSIS — W540XXD Bitten by dog, subsequent encounter: Secondary | ICD-10-CM | POA: Diagnosis not present

## 2017-06-29 DIAGNOSIS — F430 Acute stress reaction: Secondary | ICD-10-CM | POA: Insufficient documentation

## 2017-06-29 NOTE — Patient Instructions (Addendum)
It was nice seeing you again today Ms. Mangini!  Please begin practicing the deep breathing exercises. If your anxiety does not seem to be improving with time and with the exercises, please schedule another appointment.   Please be sure that Animal Control will contact you when the dog is released from quarantine so you will know their final findings of their observation of the dog.   If you have any questions or concerns, please feel free to call the clinic.   Be well,  Dr. Avon Gully

## 2017-06-29 NOTE — Progress Notes (Signed)
Subjective:   Patient: Erika [name]       Birthdate: April 05, 1980       MRN: [id number]      HPI  Erika [name] is a 37 y.o. female presenting for anxiety.   Follow up of dog bite Patient was attacked by two dogs on 04/14. Dogs belong to her neighbor that lives two doors down. Patient was standing in yard for house between her and the dogs' house, when the two dogs ran out of the house and attacked her. Both dogs had shown aggressive behavior before, attacking humans and other animals. Animal Control was contacted immediately and took both dogs into quarantine. Patient was seen at Urgent Care that day and was given Tdap and started on Augmentin. She was seen by me a few days later. At that time, wound was healing well. She then began to have diarrhea from Augmentin, so was transitioned to doxycycline. Diarrhea was worse with doxycycline, so decision was made to monitor off of antibiotics. Wound has continued to heal well off of antibiotics. Patient denies erythema or drainage at site, and says it is scabbing over. She does not have pain at the site. Denies fevers or chills.  Patient has had significant anxiety over the past two weeks since the attacks. She is concerned in part because Animal Control informed her that one of the dogs has not been vaccinated at all. That dog is still in quarantine. The other day was vaccinated, however his rabies vaccine lapsed in 04/2017. That dog was released to his owner on 04/27. Patient asked Animal Control if she needed rabies vaccine or not, and she was told to consult her doctor.  She says that she has a history of anxiety, but current symptoms since attack are more severe than usual. She is afraid to go outside now that the one dog has been returned to his owner. Says she has also been crying a lot. She feels scared when she hears a dog barking. She is having some difficulty sleeping, but that is not a new thing. Has history of bipolar disorder and anxiety for  which she has taken medication in past, but is not currently prescribe any medication for either issue.   Smoking status reviewed. Patient is former smoker.   Review of Systems See HPI.     Objective:  Physical Exam  Constitutional: She appears well-developed and well-nourished.  HENT:  Head: Normocephalic and atraumatic.  Cardiovascular: Normal rate.  Pulmonary/Chest: Effort normal. No respiratory distress.  Musculoskeletal: Normal range of motion.  Skin: Skin is warm and dry.  Psychiatric: She has a normal mood and affect. Her behavior is normal.      Assessment & Plan:  Dog bite Healing well off antibiotics. As such, will not resume antibiotics.  Regarding need for rabies post-exposure prophylaxis, do not feel that this is indicated. Technically as patient was in neighbor's yard closer to the two dogs' yard, attack could be considered provoke, as she was theoretically entering the dogs' territory. Both dogs had also exhibited this type of behavior before, so this was not new for them. One dog has received shots though rabies vaccine has been lapsed x2 mo. That dog was quarantined for over 10 days then felt safe to release by Animal Control. Other dog has not received shots, though has been in quarantine for 15 days now. Per UpToDate algorithm, given all these things, post-exposure prophylaxis is not indicated. Went through algorithm with patient, who feels comfortable not  proceeding with rabies vaccine at this time. Did encourage her to contact Animal Control when second dog is released so she knows their final report after full observation of the dog.   Acute stress reaction Significant anxiety after dog attack two weeks ago. Does have history of bipolar disorder and anxiety for which she is not currently taking medication, though says current symptoms are more severe than typical anxiety. Explained that, as incident occurred only 2w ago, she is still in period where anxiety regarding  the event is considered somewhat normal and does not require medication. Provided handouts with deep breathing techniques. If sx not improving after additional two weeks, or if sx worsening, could consider beginning anxiety treatment, however feel that current picture is most consistent with acute stress reaction following dog attack.    Precepted with Dr. Sheffield Slider and Dr. Deirdre Priest.   Tarri Abernethy, MD, MPH PGY-3 [facility] Pager [phone]

## 2017-06-29 NOTE — Assessment & Plan Note (Signed)
Significant anxiety after dog attack two weeks ago. Does have history of bipolar disorder and anxiety for which she is not currently taking medication, though says current symptoms are more severe than typical anxiety. Explained that, as incident occurred only 2w ago, she is still in period where anxiety regarding the event is considered somewhat normal and does not require medication. Provided handouts with deep breathing techniques. If sx not improving after additional two weeks, or if sx worsening, could consider beginning anxiety treatment, however feel that current picture is most consistent with acute stress reaction following dog attack.

## 2017-06-29 NOTE — Assessment & Plan Note (Signed)
Healing well off antibiotics. As such, will not resume antibiotics.  Regarding need for rabies post-exposure prophylaxis, do not feel that this is indicated. Technically as patient was in neighbor's yard closer to the two dogs' yard, attack could be considered provoke, as she was theoretically entering the dogs' territory. Both dogs had also exhibited this type of behavior before, so this was not new for them. One dog has received shots though rabies vaccine has been lapsed x2 mo. That dog was quarantined for over 10 days then felt safe to release by Animal Control. Other dog has not received shots, though has been in quarantine for 15 days now. Per UpToDate algorithm, given all these things, post-exposure prophylaxis is not indicated. Went through algorithm with patient, who feels comfortable not proceeding with rabies vaccine at this time. Did encourage her to contact Animal Control when second dog is released so she knows their final report after full observation of the dog.

## 2017-08-19 ENCOUNTER — Ambulatory Visit (INDEPENDENT_AMBULATORY_CARE_PROVIDER_SITE_OTHER): Payer: Commercial Managed Care - PPO | Admitting: Family Medicine

## 2017-08-19 VITALS — BP 120/80 | HR 77 | Temp 98.6°F | Ht 63.0 in | Wt 208.8 lb

## 2017-08-19 DIAGNOSIS — L249 Irritant contact dermatitis, unspecified cause: Secondary | ICD-10-CM

## 2017-08-19 DIAGNOSIS — J029 Acute pharyngitis, unspecified: Secondary | ICD-10-CM | POA: Diagnosis not present

## 2017-08-19 LAB — POCT RAPID STREP A (OFFICE): RAPID STREP A SCREEN: NEGATIVE

## 2017-08-19 MED ORDER — HYDROCORTISONE 0.5 % EX OINT: 1.0000 "application " | TOPICAL_OINTMENT | Freq: Two times a day (BID) | CUTANEOUS | 0 refills | Status: AC

## 2017-08-19 NOTE — Assessment & Plan Note (Signed)
Steep negative  No airway concern, sore throat when swalling, dry cough (no tonsils)  -Viral pharyngitis, OTC symptomatic control discussed with return precautions

## 2017-08-19 NOTE — Assessment & Plan Note (Signed)
Dr. Avon Gully prescribed topical steroid from prior note but it does not appear that patient picked it up.  Steroid re-prescribed and note given for work asking for alternative glove material if available.  Patient not sure she will give note because she is concerned her shifts could get cut if options do not exist.   Also scheduled for Derm clinic in July

## 2017-08-19 NOTE — Progress Notes (Signed)
    Subjective:  Erika [name] is a 37 y.o. female who presents to the [facility] today with a chief complaint of sore throat and dermatitis.   HPI: Sore throat is with cough and discomfort on swalling x 2days.   She has had sick contacts at work.  She has not seen another doctor for this episode.  Cough seems to be " in her throat" and not in chest, it is dry.  No sputum/no blood.  She also states she has some sinus pressure and hx of sinus surgery for sinusitis.  She has no fever symptoms.  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  The rash is from wrist distal, bilaterally.  She denies any other symtpoms elsewehre.  She denies bleeding but does say she scratches quite a bit.  She has tried lotion OTC but it hasn't helped, she is not sure what the gloves are made of or what soaps she uses.   Objective:  Physical Exam: BP 120/80   Pulse 77   Temp 98.6 F (37 C) (Oral)   Ht 5\' 3"  (1.6 m)   Wt 208 lb 12.8 oz (94.7 kg)   LMP 08/01/2017   SpO2 98%   BMI 36.99 kg/m   Gen: NAD, resting comfortably, obese HEENT: MMM, no tonsils, ears clear bilaterally with signs of prior tympanostomy on TM, clear nares CV: RRR with no murmurs appreciated Pulm: NWOB, CTAB with no crackles, wheezes, or rhonchi, 1-2 coughs during exam were dry GI: Normal bowel sounds present. Soft, Nontender, Nondistended. MSK: no edema, cyanosis, or clubbing noted Skin: (pic in media tab of hands) hands bilaterally distal to wrist with dry/scattered redness without scaling/drainage.  Not raised/no bleeding, not confined to webbing Neuro: grossly normal, moves all extremities Psych: Normal affect and thought content  Results for orders placed or performed in visit on 08/19/17 (from the past 72 hour(s))   POCT rapid strep A     Status: None   Collection Time: 08/19/17  3:19 PM  Result Value Ref Range   Rapid Strep A Screen Negative Negative     Assessment/Plan:  Contact dermatitis Dr. Avon Gully prescribed topical steroid from prior note but it does not appear that patient picked it up.  Steroid re-prescribed and note given for work asking for alternative glove material if available.  Patient not sure she will give note because she is concerned her shifts could get cut if options do not exist.   Also scheduled for Derm clinic in July  Sore throat Steep negative  No airway concern, sore throat when swalling, dry cough (no tonsils)  -Viral pharyngitis, OTC symptomatic control discussed with return precautions   Sherene Sires, [place] - PGY1 08/19/2017 4:01 PM

## 2017-08-19 NOTE — Patient Instructions (Signed)
It was a pleasure to see you today! Thank you for choosing [facility] for your primary care. Erika [name] was seen for sore throat and dermatitis. Come back to the clinic  On 7/18, and go to the emergency room if you have any life threatening symptoms.  Your throat swab showed neg for strep which means this is likely a viral pharyngitis which is not appropriate for an antibiotic.  We suggest cough drops (over the counter) and maybe warm tea with honey for symptomatic help.  We are prescrbing a steroid cream for your hands and have written you a note to hopefully allow for a different type of     If we did any lab work today that did not result today, one of two things will happen.  1. If everything is normal, you will get a letter in mail sent to the address in your chart with the results for your records.  It is important to keep your address up to date as that is where we will send results.  2. If the results require some sort of discussion, my nurses or myself will call you on the phone number listed in your records.  It is important to keep your phone number up to date in our system as this is how we will try to reach you.  If we cannot reach you on the phone, we will try to send you a letter in the mail so please enable to voicemail function of your phone.  If you don't hear from [place] in two weeks, please give [place] a call to verify your results. Otherwise, we look forward to seeing you again at your next visit. If you have any questions or concerns before then, please call the clinic at [phone].   Please bring all your medications to every doctors visit   Sign up for My Chart to have easy access to your labs results, and communication with your Primary care physician.     Please check-out at the front desk before leaving the clinic.     Best,  Dr. Sherene Sires FAMILY MEDICINE RESIDENT - PGY1 08/19/2017 3:23 PM

## 2017-09-17 ENCOUNTER — Ambulatory Visit (INDEPENDENT_AMBULATORY_CARE_PROVIDER_SITE_OTHER): Payer: Commercial Managed Care - PPO | Admitting: Family Medicine

## 2017-09-17 VITALS — BP 116/78 | HR 74 | Temp 98.0°F | Wt 213.8 lb

## 2017-09-17 DIAGNOSIS — L259 Unspecified contact dermatitis, unspecified cause: Secondary | ICD-10-CM

## 2017-09-17 MED ORDER — CETIRIZINE HCL 10 MG PO TABS: 10.0000 mg | ORAL_TABLET | Freq: Every day | ORAL | 11 refills | Status: DC

## 2017-09-17 MED ORDER — TRIAMCINOLONE ACETONIDE 0.1 % EX CREA: 1.0000 "application " | TOPICAL_CREAM | Freq: Two times a day (BID) | CUTANEOUS | 0 refills | Status: AC

## 2017-09-17 NOTE — Assessment & Plan Note (Signed)
Contact vs. Irritant dermatitis. Unsure if it is from gloves vs. Soap from work. Will increase potency of topical steroid and trial Zyrtec for pruritis. Recommend pt avoid harsh soaps, lotions, perfumes, as much as possible. Pt advised to change gloves at work frequently to avoid sweating. Pt to return to clinic if no improvement.

## 2017-09-17 NOTE — Progress Notes (Addendum)
    Subjective:  Erika [name] is a 37 y.o. female who presents to the [facility] today with a chief complaint of rash on hand.   HPI:  Rash on hands  Pt has had rash on both hands for past several months. It is itchy. She has had not improvement from putting 0.5% hydrocortisone treatment. She works at [employer] and wears gloves and washes her hands multiple times a day. She has tried new, Vynil gloves at work without improvement. She has not had a rash like this before. She denies any fever, chills, joint pain, drainage from the site. It does get worse when her hands sweat inside the glove.   ROS: Per HPI   Objective:  Physical Exam: BP 116/78   Pulse 74   Temp 98 F (36.7 C) (Oral)   Wt 213 lb 12.8 oz (97 kg)   SpO2 98%   BMI 37.87 kg/m   Gen: NAD, resting comfortably Skin: erythematous maculopapule on ventral hands bilaterally. No flaking. Some trauma from excoriations Neuro: grossly normal, moves all extremities Psych: Normal affect and thought content  No results found for this or any previous visit (from the past 72 hour(s)).   Assessment/Plan:  Contact dermatitis Contact vs. Irritant dermatitis. Unsure if it is from gloves vs. Soap from work. Will increase potency of topical steroid and trial Zyrtec for pruritis. Recommend pt avoid harsh soaps, lotions, perfumes, as much as possible. Pt advised to change gloves at work frequently to avoid sweating. Pt to return to clinic if no improvement.    Lab Orders  No laboratory test(s) ordered today    Meds ordered this encounter  Medications  . triamcinolone cream (KENALOG) 0.1 %    Sig: Apply 1 application topically 2 (two) times daily.    Dispense:  30 g    Refill:  0  . DISCONTD: cetirizine (ZYRTEC) 10 MG tablet    Sig: Take 1 tablet (10 mg total) by mouth daily.    Dispense:  30 tablet    Refill:  [place], MD, [place] - PGY1 09/17/2017 4:12 PM   I was present during the history and  physical  Gary

## 2017-09-17 NOTE — Patient Instructions (Signed)
Contact Dermatitis Dermatitis is redness, soreness, and swelling (inflammation) of the skin. Contact dermatitis is a reaction to certain substances that touch the skin. You either touched something that irritated your skin, or you have allergies to something you touched. Follow these instructions at home: Skin Care  Moisturize your skin as needed.  Apply cool compresses to the affected areas.  Try taking a bath with: ? Epsom salts. Follow the instructions on the package. You can get these at a pharmacy or grocery store. ? Baking soda. Pour a small amount into the bath as told by your doctor. ? Colloidal oatmeal. Follow the instructions on the package. You can get this at a pharmacy or grocery store.  Try applying baking soda paste to your skin. Stir water into baking soda until it looks like paste.  Do not scratch your skin.  Bathe less often.  Bathe in lukewarm water. Avoid using hot water. Medicines  Take or apply over-the-counter and prescription medicines only as told by your doctor.  If you were prescribed an antibiotic medicine, take or apply your antibiotic as told by your doctor. Do not stop taking the antibiotic even if your condition starts to get better. General instructions  Keep all follow-up visits as told by your doctor. This is important.  Avoid the substance that caused your reaction. If you do not know what caused it, keep a journal to try to track what caused it. Write down: ? What you eat. ? What cosmetic products you use. ? What you drink. ? What you wear in the affected area. This includes jewelry.  If you were given a bandage (dressing), take care of it as told by your doctor. This includes when to change and remove it. Contact a doctor if:  You do not get better with treatment.  Your condition gets worse.  You have signs of infection such as: ? Swelling. ? Tenderness. ? Redness. ? Soreness. ? Warmth.  You have a fever.  You have new  symptoms. Get help right away if:  You have a very bad headache.  You have neck pain.  Your neck is stiff.  You throw up (vomit).  You feel very sleepy.  You see red streaks coming from the affected area.  Your bone or joint underneath the affected area becomes painful after the skin has healed.  The affected area turns darker.  You have trouble breathing. This information is not intended to replace advice given to you by your health care provider. Make sure you discuss any questions you have with your health care provider. Document Released: 12/15/2008 Document Revised: 07/26/2015 Document Reviewed: 07/05/2014 Elsevier Interactive Patient Education  2018 Elsevier Inc.  

## 2018-04-07 ENCOUNTER — Encounter (HOSPITAL_COMMUNITY): Payer: Self-pay | Admitting: *Deleted

## 2018-04-07 ENCOUNTER — Inpatient Hospital Stay (HOSPITAL_COMMUNITY)
Admission: AD | Admit: 2018-04-07 | Discharge: 2018-04-07 | Disposition: A | Discharge status: Home / Self Care | Payer: Medicaid Other | Attending: Obstetrics and Gynecology | Admitting: Obstetrics and Gynecology

## 2018-04-07 ENCOUNTER — Other Ambulatory Visit: Payer: Self-pay

## 2018-04-07 DIAGNOSIS — N939 Abnormal uterine and vaginal bleeding, unspecified: Secondary | ICD-10-CM | POA: Diagnosis present

## 2018-04-07 DIAGNOSIS — Z87891 Personal history of nicotine dependence: Secondary | ICD-10-CM | POA: Diagnosis not present

## 2018-04-07 DIAGNOSIS — N920 Excessive and frequent menstruation with regular cycle: Secondary | ICD-10-CM | POA: Diagnosis not present

## 2018-04-07 LAB — URINALYSIS, MICROSCOPIC (REFLEX): WBC UA: NONE SEEN WBC/hpf (ref 0–5)

## 2018-04-07 LAB — URINALYSIS, ROUTINE W REFLEX MICROSCOPIC
BILIRUBIN URINE: NEGATIVE
Glucose, UA: NEGATIVE mg/dL
KETONES UR: NEGATIVE mg/dL
Leukocytes, UA: NEGATIVE
NITRITE: NEGATIVE
PH: 5.5 (ref 5.0–8.0)
Protein, ur: NEGATIVE mg/dL
Specific Gravity, Urine: 1.015 (ref 1.005–1.030)

## 2018-04-07 LAB — WET PREP, GENITAL
CLUE CELLS WET PREP: NONE SEEN
Sperm: NONE SEEN
Trich, Wet Prep: NONE SEEN
Yeast Wet Prep HPF POC: NONE SEEN

## 2018-04-07 LAB — CBC
HCT: 37.8 % (ref 36.0–46.0)
Hemoglobin: 12.4 g/dL (ref 12.0–15.0)
MCH: 27 pg (ref 26.0–34.0)
MCHC: 32.8 g/dL (ref 30.0–36.0)
MCV: 82.4 fL (ref 80.0–100.0)
NRBC: 0 % (ref 0.0–0.2)
PLATELETS: 212 10*3/uL (ref 150–400)
RBC: 4.59 MIL/uL (ref 3.87–5.11)
RDW: 13 % (ref 11.5–15.5)
WBC: 6.9 10*3/uL (ref 4.0–10.5)

## 2018-04-07 MED ORDER — NAPROXEN 500 MG PO TABS: 500.0000 mg | ORAL_TABLET | Freq: Two times a day (BID) | ORAL | 0 refills | Status: DC

## 2018-04-07 NOTE — MAU Note (Signed)
Pt reports yesterday she was bleeding really heavily ( pad and tampon q 2 hours), passing large clots. Yesterday she was having lower abd cramping , no pain today.

## 2018-04-07 NOTE — Discharge Instructions (Signed)
Abnormal Uterine Bleeding  Abnormal uterine bleeding means bleeding more than usual from your uterus. It can include:   Bleeding between periods.   Bleeding after sex.   Bleeding that is heavier than normal.   Periods that last longer than usual.   Bleeding after you have stopped having your period (menopause).  There are many problems that may cause this. You should see a doctor for any kind of bleeding that is not normal. Treatment depends on the cause of the bleeding.  Follow these instructions at home:   Watch your condition for any changes.   Do not use tampons, douche, or have sex, if your doctor tells you not to.   Change your pads often.   Get regular well-woman exams. Make sure they include a pelvic exam and cervical cancer screening.   Keep all follow-up visits as told by your doctor. This is important.  Contact a doctor if:   The bleeding lasts more than one week.   You feel dizzy at times.   You feel like you are going to throw up (nauseous).   You throw up.  Get help right away if:   You pass out.   You have to change pads every hour.   You have belly (abdominal) pain.   You have a fever.   You get sweaty.   You get weak.   You passing large blood clots from your vagina.  Summary   Abnormal uterine bleeding means bleeding more than usual from your uterus.   There are many problems that may cause this. You should see a doctor for any kind of bleeding that is not normal.   Treatment depends on the cause of the bleeding.  This information is not intended to replace advice given to you by your health care provider. Make sure you discuss any questions you have with your health care provider.  Document Released: 12/15/2008 Document Revised: 02/12/2016 Document Reviewed: 02/12/2016  Elsevier Interactive Patient Education  2019 Elsevier Inc.

## 2018-04-07 NOTE — MAU Provider Note (Signed)
History     CSN: [id number]  Arrival date and time: 04/07/18 6063   First Provider Initiated Contact with Patient 04/07/18 1027      Chief Complaint  Patient presents with  . Vaginal Bleeding   Erika [name] is a 38 y.o. [phone] who is here today with vaginal bleeding. Her period started on 2/2 and it has been heavier than normal.   Vaginal Bleeding  The patient's primary symptoms include vaginal bleeding. The patient's pertinent negatives include no pelvic pain. This is a new problem. Episode onset: 04/04/18. The problem occurs constantly. The problem has been unchanged. The patient is experiencing no pain. She is not pregnant. Pertinent negatives include no chills, dysuria, fever, frequency, nausea or vomiting. The vaginal discharge was bloody. The vaginal bleeding is heavier than menses. She has been passing clots (one clot about the size of her hand. ). She has not been passing tissue. Nothing aggravates the symptoms. She has tried nothing for the symptoms. She is sexually active. She uses tubal ligation for contraception. Her menstrual history has been regular (LMP 04/04/18 ).    OB History    Gravida  4   Para      Term      Preterm      AB  1   Living  3     SAB      TAB  1   Ectopic      Multiple      Live Births              Past Medical History:  Diagnosis Date  . Arthritis   . Back pain   . Depression   . Migraine   . Obesity   . UTI (urinary tract infection)     Past Surgical History:  Procedure Laterality Date  . ADENOIDECTOMY    . 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 CESAREAN SECTION  08/20/2005  . INCISION AND DRAINAGE ABSCESS Right    with packing on right side  . MOUTH SURGERY    . NASAL SINUS SURGERY    . TONSILLECTOMY    . TUBAL LIGATION      Family History  Problem Relation Age of Onset  . Alcohol abuse Father   . Cancer Maternal Grandmother        breast ca, cervical  . Cancer Paternal Grandmother         breast ca  . Breast cancer Other        pat gm's sister  . Colon cancer Neg Hx     Social History   Tobacco Use  . Smoking status: Former Smoker    Packs/day: 0.50    Last attempt to quit: 03/03/2010    Years since quitting: 8.1  . Smokeless tobacco: Never Used  Substance Use Topics  . Alcohol use: Yes    Comment: rare  . Drug use: No    Allergies: No Known Allergies  Medications Prior to Admission  Medication Sig Dispense Refill Last Dose  . hydrocortisone ointment 0.5 % Apply 1 application topically 2 (two) times daily. 30 g 0   . meloxicam (MOBIC) 15 MG tablet Take 1 tablet (15 mg total) by mouth daily. 90 tablet 1   . triamcinolone cream (KENALOG) 0.1 % Apply 1 application topically 2 (two) times daily. 30 g 0     Review of Systems  Constitutional: Negative for chills and fever.  Gastrointestinal: Negative for  nausea and vomiting.  Genitourinary: Positive for vaginal bleeding. Negative for dysuria, frequency and pelvic pain.   Physical Exam   Blood pressure (!) 101/44, pulse 67, temperature 98.3 F (36.8 C), resp. rate 18, height 5\' 4"  (1.626 m), weight 101.6 kg, last menstrual period 04/04/2018, SpO2 100 %.  Physical Exam  Nursing note and vitals reviewed. Constitutional: She is oriented to person, place, and time. She appears well-developed and well-nourished. No distress.  HENT:  Head: Normocephalic.  Cardiovascular: Normal rate.  Respiratory: Effort normal.  GI: Soft. There is no abdominal tenderness. There is no rebound.  Genitourinary:    Genitourinary Comments:  External: no lesion Vagina: small amount of blood seen  Cervix: pink, smooth, no CMT Uterus: NSSC Adnexa: NT    Neurological: She is alert and oriented to person, place, and time.  Skin: Skin is warm and dry.  Psychiatric: She has a normal mood and affect.     Results for orders placed or performed during the hospital encounter of 04/07/18 (from the past 24 hour(s))  Urinalysis,  Routine w reflex microscopic     Status: Abnormal   Collection Time: 04/07/18 10:01 AM  Result Value Ref Range   Color, Urine YELLOW YELLOW   APPearance CLEAR CLEAR   Specific Gravity, Urine 1.015 1.005 - 1.030   pH 5.5 5.0 - 8.0   Glucose, UA NEGATIVE NEGATIVE mg/dL   Hgb urine dipstick LARGE (A) NEGATIVE   Bilirubin Urine NEGATIVE NEGATIVE   Ketones, ur NEGATIVE NEGATIVE mg/dL   Protein, ur NEGATIVE NEGATIVE mg/dL   Nitrite NEGATIVE NEGATIVE   Leukocytes, UA NEGATIVE NEGATIVE  Urinalysis, Microscopic (reflex)     Status: Abnormal   Collection Time: 04/07/18 10:01 AM  Result Value Ref Range   RBC / HPF 6-10 0 - 5 RBC/hpf   WBC, UA NONE SEEN 0 - 5 WBC/hpf   Bacteria, UA FEW (A) NONE SEEN   Squamous Epithelial / LPF 0-5 0 - 5   Mucus PRESENT   Wet prep, genital     Status: Abnormal   Collection Time: 04/07/18 10:44 AM  Result Value Ref Range   Yeast Wet Prep HPF POC NONE SEEN NONE SEEN   Trich, Wet Prep NONE SEEN NONE SEEN   Clue Cells Wet Prep HPF POC NONE SEEN NONE SEEN   WBC, Wet Prep HPF POC FEW (A) NONE SEEN   Sperm NONE SEEN   CBC     Status: None   Collection Time: 04/07/18 10:54 AM  Result Value Ref Range   WBC 6.9 4.0 - 10.5 K/uL   RBC 4.59 3.87 - 5.11 MIL/uL   Hemoglobin 12.4 12.0 - 15.0 g/dL   HCT 37.8 36.0 - 46.0 %   MCV 82.4 80.0 - 100.0 fL   MCH 27.0 26.0 - 34.0 pg   MCHC 32.8 30.0 - 36.0 g/dL   RDW 13.0 11.5 - 15.5 %   Platelets 212 150 - 400 K/uL   nRBC 0.0 0.0 - 0.2 %     MAU Course  Procedures  MDM   Assessment and Plan   1. Menorrhagia with regular cycle    DC home Comfort measures reviewed  Bleeding precautions RX: Naproxen 500mg  BID PRN  Outpatient [place] ordered Will send message to clinic to schedule patient   [place] for [place] Follow up.   Specialty:  Obstetrics and Gynecology Contact information: [address] [place] [phone]  [facility]  Follow up.   Why:  They will call you with an appointment

## 2018-04-08 LAB — GC/CHLAMYDIA PROBE AMP (~~LOC~~) NOT AT ARMC
CHLAMYDIA, DNA PROBE: NEGATIVE
NEISSERIA GONORRHEA: NEGATIVE

## 2018-04-14 ENCOUNTER — Ambulatory Visit (INDEPENDENT_AMBULATORY_CARE_PROVIDER_SITE_OTHER): Payer: Self-pay | Admitting: Clinical

## 2018-04-14 ENCOUNTER — Ambulatory Visit (HOSPITAL_COMMUNITY)
Admission: RE | Admit: 2018-04-14 | Discharge: 2018-04-14 | Disposition: A | Discharge status: Home / Self Care | Payer: Medicaid Other | Source: Ambulatory Visit | Attending: Advanced Practice Midwife | Admitting: Advanced Practice Midwife

## 2018-04-14 ENCOUNTER — Ambulatory Visit (INDEPENDENT_AMBULATORY_CARE_PROVIDER_SITE_OTHER): Payer: Medicaid Other | Admitting: Obstetrics & Gynecology

## 2018-04-14 ENCOUNTER — Other Ambulatory Visit (HOSPITAL_COMMUNITY)
Admission: RE | Admit: 2018-04-14 | Discharge: 2018-04-14 | Disposition: A | Discharge status: Home / Self Care | Payer: Medicaid Other | Source: Ambulatory Visit | Attending: Obstetrics & Gynecology | Admitting: Obstetrics & Gynecology

## 2018-04-14 VITALS — BP 116/69 | HR 75 | Wt 224.6 lb

## 2018-04-14 DIAGNOSIS — Z0000 Encounter for general adult medical examination without abnormal findings: Secondary | ICD-10-CM

## 2018-04-14 DIAGNOSIS — F439 Reaction to severe stress, unspecified: Secondary | ICD-10-CM

## 2018-04-14 DIAGNOSIS — N920 Excessive and frequent menstruation with regular cycle: Secondary | ICD-10-CM | POA: Diagnosis not present

## 2018-04-14 DIAGNOSIS — Z23 Encounter for immunization: Secondary | ICD-10-CM

## 2018-04-14 DIAGNOSIS — F4322 Adjustment disorder with anxiety: Secondary | ICD-10-CM

## 2018-04-14 NOTE — BH Specialist Note (Signed)
Integrated Behavioral Health Initial Visit  MRN: [id number] Name: Erika [name]  Number of [place] Clinician visits:: 1/6 Session Start time: 3:49 Session End time: 4:23 Total time: 30 minutes  Type of Service: [place] Interpretor:No. Interpretor Name and Language: n/a   Warm Hand Off Completed.       SUBJECTIVE: Erika [name] is a 38 y.o. female accompanied by n/a Patient was referred by Clovia Cuff, MD for stress Patient reports the following symptoms/concerns: Pt states her primary concern today is feeling anxiety and panic have increased in recent months; attributes to increase to the stress of "dealing with" her sister(sister is experiencing opioid use disorder); attributes anxiety to family history of early menopause.   Pt does not attribute symptoms to untreated bipolar affective disorder. Pt requesting self-coping strategies for stress and anxiety only.  Duration of problem: Increase in over two months; Severity of problem: moderate  OBJECTIVE: Mood: Anxious and Affect: Appropriate Risk of harm to self or others: No plan to harm self or others  LIFE CONTEXT: Family and Social: Pt has three daughters (2 grown; 43yo). Mother is supportive School/Work: Recently left stressful job Self-Care: Recognizing a greater need for self-care Life Changes: Ongoing family conflict (sister experiencing opioid use disorder), recent job loss; possible perimenopaus  GOALS ADDRESSED: Patient will: 1. Reduce symptoms of: anxiety and stress 2. Increase knowledge and/or ability of: self-management skills and stress reduction  3. Demonstrate ability to: Increase healthy adjustment to current life circumstances and Increase adequate support systems for patient/family  INTERVENTIONS: Interventions utilized: Mindfulness or Psychologist, educational, Psychoeducation and/or Health Education and Link to [employer]  Standardized  Assessments completed: GAD-7 and PHQ 9  ASSESSMENT: Patient currently experiencing Adjustment disorder with anxiety.   Patient may benefit from psychoeducation and brief therapeutic interventions regarding coping with symptoms of anxiety, panic, and depression .  PLAN: 1. Follow up with behavioral health clinician on : As needed 2. Behavioral recommendations:  -CALM relaxation breathing exercise at least twice daily (morning; at bedtime); continue for as long as remains helpful  -Read educational materials regarding coping with symptoms of anxiety, panic, and depression -Consider apps discussed as additional self-coping strategy -Consider sharing community resources and treatment options with sister (and family members, for awareness and support) 3. Referral(s): Integrated [employer] (In Clinic) and [employer] Resources:  Resources to share with family members 4. "From scale of 1-10, how likely are you to follow plan?": 9  RHILYN BATTLE, LCSW  Depression screen [facility] 2/9 04/14/2018 04/14/2018 06/29/2017 06/18/2017 03/05/2017  Decreased Interest - 1 0 0 0  Down, Depressed, Hopeless - 1 1 0 0  PHQ - 2 Score - 2 1 0 0  Altered sleeping - 1 - - -  Tired, decreased energy - 3 - - -  Change in appetite - 1 - - -  Feeling bad or failure about yourself  - 1 - - -  Trouble concentrating - 3 - - -  Moving slowly or fidgety/restless - 1 - - -  Suicidal thoughts 0 1 - - -  PHQ-9 Score - 13 - - -   GAD 7 : Generalized Anxiety Score 04/14/2018  Nervous, Anxious, on Edge 3  Control/stop worrying 3  Worry too much - different things 3  Trouble relaxing 3  Restless 1  Easily annoyed or irritable 3  Afraid - awful might happen 1  Total GAD 7 Score 17

## 2018-04-14 NOTE — Progress Notes (Signed)
Subjective:    Patient ID: Erika [name], female    DOB: 10-Dec-1980, 38 y.o.   MRN: [id number]  HPI 38 yo separated P3 (21, 20, and 27 yo daughters) here today for follow up after a visit at the MAU last week for an abnormally heavy period. She passed a large clot. Her hbg then was normal as was an u/s.    Review of Systems She used to take thyroid medicine but stopped about 2015 or 2016 when she lost her insurance.  She had a BTL.    Objective:   Physical Exam Breathing, conversing, and ambulating normally Well nourished, well hydrated White female, no apparent distress Abd- benign Normal speculum exam   Assessment & [name]:  Abnormally heavy period- check TSH Pap smear and flu vaccine today She will speak with Asher Muir today due to some stress/anxiety I have given her reassurance regarding her 1 time passage of a clot with her period.

## 2018-04-15 ENCOUNTER — Telehealth: Payer: Self-pay | Admitting: *Deleted

## 2018-04-15 ENCOUNTER — Other Ambulatory Visit: Payer: Self-pay | Admitting: Obstetrics & Gynecology

## 2018-04-15 LAB — TSH: TSH: 4.53 u[IU]/mL — AB (ref 0.450–4.500)

## 2018-04-15 MED ORDER — LEVOTHYROXINE SODIUM 25 MCG PO TABS: 25.0000 ug | ORAL_TABLET | Freq: Every day | ORAL | 6 refills | Status: DC

## 2018-04-15 NOTE — Progress Notes (Unsigned)
Synthroid prescribed.

## 2018-04-15 NOTE — Telephone Encounter (Signed)
-----   Message from Emily Filbert, MD sent at 04/15/2018 10:46 AM EST ----- Please let her know that her TSH was abnormal and I have prescribed synthroid. She will need another TSH in 6 weeks. Thanks

## 2018-04-15 NOTE — Telephone Encounter (Signed)
I called Erika [name] and notified her of results per Dr.Dove and that synthroid was sent to her pharmacy . I also advised her she needs a lab recheck of her TSH in 6 weeks. She agreed to a Monday around 9am in 6 weeks. I informed her I will have registrars schedule and call her with appointment. She voices understanding.

## 2018-04-19 LAB — CYTOLOGY - PAP
Diagnosis: NEGATIVE
HPV: NOT DETECTED

## 2018-05-24 ENCOUNTER — Other Ambulatory Visit: Payer: Self-pay

## 2018-05-24 ENCOUNTER — Other Ambulatory Visit: Payer: Self-pay | Admitting: *Deleted

## 2018-05-24 DIAGNOSIS — N920 Excessive and frequent menstruation with regular cycle: Secondary | ICD-10-CM

## 2018-12-18 ENCOUNTER — Other Ambulatory Visit: Payer: Self-pay

## 2018-12-18 ENCOUNTER — Encounter (HOSPITAL_BASED_OUTPATIENT_CLINIC_OR_DEPARTMENT_OTHER): Payer: Self-pay | Admitting: Adult Health

## 2018-12-18 ENCOUNTER — Emergency Department (HOSPITAL_BASED_OUTPATIENT_CLINIC_OR_DEPARTMENT_OTHER)
Admission: EM | Admit: 2018-12-18 | Discharge: 2018-12-18 | Disposition: A | Discharge status: Home / Self Care | Payer: Medicaid Other | Attending: Emergency Medicine | Admitting: Emergency Medicine

## 2018-12-18 ENCOUNTER — Emergency Department (HOSPITAL_BASED_OUTPATIENT_CLINIC_OR_DEPARTMENT_OTHER): Payer: Medicaid Other

## 2018-12-18 DIAGNOSIS — Z87891 Personal history of nicotine dependence: Secondary | ICD-10-CM | POA: Insufficient documentation

## 2018-12-18 DIAGNOSIS — R519 Headache, unspecified: Secondary | ICD-10-CM | POA: Diagnosis present

## 2018-12-18 DIAGNOSIS — G43909 Migraine, unspecified, not intractable, without status migrainosus: Secondary | ICD-10-CM | POA: Insufficient documentation

## 2018-12-18 DIAGNOSIS — R112 Nausea with vomiting, unspecified: Secondary | ICD-10-CM | POA: Insufficient documentation

## 2018-12-18 LAB — CBC WITH DIFFERENTIAL/PLATELET
Abs Immature Granulocytes: 0.06 10*3/uL (ref 0.00–0.07)
Basophils Absolute: 0.1 10*3/uL (ref 0.0–0.1)
Basophils Relative: 1 %
Eosinophils Absolute: 0.4 10*3/uL (ref 0.0–0.5)
Eosinophils Relative: 4 %
HCT: 41.2 % (ref 36.0–46.0)
Hemoglobin: 13.8 g/dL (ref 12.0–15.0)
Immature Granulocytes: 1 %
Lymphocytes Relative: 38 %
Lymphs Abs: 3.8 10*3/uL (ref 0.7–4.0)
MCH: 28.1 pg (ref 26.0–34.0)
MCHC: 33.5 g/dL (ref 30.0–36.0)
MCV: 83.9 fL (ref 80.0–100.0)
Monocytes Absolute: 0.7 10*3/uL (ref 0.1–1.0)
Monocytes Relative: 7 %
Neutro Abs: 5.1 10*3/uL (ref 1.7–7.7)
Neutrophils Relative %: 49 %
Platelets: 221 10*3/uL (ref 150–400)
RBC: 4.91 MIL/uL (ref 3.87–5.11)
RDW: 12.7 % (ref 11.5–15.5)
WBC: 10 10*3/uL (ref 4.0–10.5)
nRBC: 0 % (ref 0.0–0.2)

## 2018-12-18 MED ORDER — KETOROLAC TROMETHAMINE 30 MG/ML IJ SOLN: 30.0000 mg | Freq: Once | INTRAMUSCULAR | Status: DC

## 2018-12-18 MED ORDER — SODIUM CHLORIDE 0.9 % IV BOLUS
1000.0000 mL | Freq: Once | INTRAVENOUS | Status: AC
Start: 2018-12-18 — End: 2018-12-18
  Administered 2018-12-18: 20:00:00 1000 mL via INTRAVENOUS

## 2018-12-18 MED ORDER — DEXAMETHASONE SODIUM PHOSPHATE 4 MG/ML IJ SOLN
4.0000 mg | Freq: Once | INTRAMUSCULAR | Status: AC
  Administered 2018-12-18: 4 mg via INTRAVENOUS
  Filled 2018-12-18: qty 1

## 2018-12-18 MED ORDER — PROCHLORPERAZINE EDISYLATE 10 MG/2ML IJ SOLN
10.0000 mg | Freq: Once | INTRAMUSCULAR | Status: AC
  Administered 2018-12-18: 10 mg via INTRAVENOUS
  Filled 2018-12-18: qty 2

## 2018-12-18 MED ORDER — DIPHENHYDRAMINE HCL 50 MG/ML IJ SOLN
25.0000 mg | Freq: Once | INTRAMUSCULAR | Status: AC
  Administered 2018-12-18: 25 mg via INTRAVENOUS
  Filled 2018-12-18: qty 1

## 2018-12-18 NOTE — ED Notes (Signed)
Resident MD at bedside.

## 2018-12-18 NOTE — ED Triage Notes (Signed)
Erika [name] presents with a headache that begins and is worse in her occiput. She states the headache began 3 weeks ago and she has been seen for it multiple times. She does have a hx of migraines but this is different. She reports dizziness, fatigue, feeling like her brain is sluggish and no relief from migraine medication. Lying down, movement and light and sound make the headache owrse. Nothing makes it better.

## 2018-12-18 NOTE — ED Provider Notes (Signed)
[facility] EMERGENCY DEPARTMENT Provider Note   CSN: [id number] Arrival date & time: 12/18/18  1828     History   Chief Complaint Chief Complaint  Patient presents with  . Headache    HPI Erika [name] is a 38 y.o. female.     Patient with a history of migraines presents with 3 weeks of headache.  Patient states that the headache started about 1 week after her gallbladder surgery which she had 1 month ago.  Reports that the headaches are constant but do fluctuate in intensity.  Start Lexapro region and then radiate to the front of her head.  Patient was seen in the ED on 10/4 for possible viral illness, Covid negative.  Patient says that she no longer has any infectious symptoms.  Denying any change in vision or other neurologic findings.  Patient was seen by her PCP, for which she was started on Topamax and sumatriptan and Flexeril for migraine treatment and prevention.  Patient says that this has not improved it.  Says that the headache is affecting her ability to live.  Says she is unable to leave her house.  Patient had multiple head imaging in the past including MRI brain in 2019 that was normal.  Patient denies any trauma to the area.  Patient report nausea and vomiting x1 today.  Patient was sent in by PCP for head imaging.  They had ordered a CT head in outpatient setting, but headaches were so bad they wanted to be evaluated ED.     Past Medical History:  Diagnosis Date  . Arthritis   . Back pain   . Depression   . Migraine   . Obesity   . UTI (urinary tract infection)     Patient Active Problem List   Diagnosis Date Noted  . Sore throat 08/19/2017  . Acute stress reaction 06/29/2017  . Dog bite 06/18/2017  . Contact dermatitis 06/18/2017  . Sensation of pressure in bladder area 03/05/2017  . Joint pain 10/15/2016  . Seasonal allergies 06/21/2012  . Moderate dysplasia of cervix 01/17/2008  . OBESITY, NOS 04/30/2006  . BIPOLAR DISORDER 04/30/2006    Past Surgical History:  Procedure Laterality Date  . ADENOIDECTOMY    . 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 CESAREAN SECTION  08/20/2005  . INCISION AND DRAINAGE ABSCESS Right    with packing on right side  . MOUTH SURGERY    . NASAL SINUS SURGERY    . TONSILLECTOMY    . TUBAL LIGATION       OB History    Gravida  4   Para      Term      Preterm      AB  1   Living  3     SAB      TAB  1   Ectopic      Multiple      Live Births               Home Medications    Prior to Admission medications   Medication Sig Start Date End Date Taking? Authorizing Provider  hydrocortisone ointment 0.5 % Apply 1 application topically 2 (two) times daily. Patient not taking: Reported on 04/14/2018 08/19/17   Sherene Sires, DO  levothyroxine (SYNTHROID, LEVOTHROID) 25 MCG tablet Take 1 tablet (25 mcg total) by mouth daily before breakfast. 04/15/18   Emily Filbert, MD  meloxicam (MOBIC) 15  MG tablet Take 1 tablet (15 mg total) by mouth daily. Patient not taking: Reported on 04/14/2018 06/18/17   Verner Mould, MD  naproxen (NAPROSYN) 500 MG tablet Take 1 tablet (500 mg total) by mouth 2 (two) times daily. Patient not taking: Reported on 04/14/2018 04/07/18   Marcille Buffy D, CNM  triamcinolone cream (KENALOG) 0.1 % Apply 1 application topically 2 (two) times daily. Patient not taking: Reported on 04/14/2018 09/17/17   Bonnita Hollow, MD  cetirizine (ZYRTEC) 10 MG tablet Take 1 tablet (10 mg total) by mouth daily. 09/17/17 09/17/17  Bonnita Hollow, MD    Family History Family History  Problem Relation Age of Onset  . Alcohol abuse Father   . Cancer Maternal Grandmother        breast ca, cervical  . Cancer Paternal Grandmother        breast ca  . Breast cancer Other        pat gm's sister  . Colon cancer Neg Hx     Social History Social History   Tobacco Use  . Smoking status: Former Smoker    Packs/day: 0.50    Quit date:  03/03/2010    Years since quitting: 8.8  . Smokeless tobacco: Never Used  Substance Use Topics  . Alcohol use: Yes    Comment: rare  . Drug use: No     Allergies   Patient has no known allergies.   Review of Systems Review of Systems  As per HPI Physical Exam Updated Vital Signs BP 125/81   Pulse 89   Temp 98.1 F (36.7 C) (Oral)   Resp 18   Ht 5\' 4"  (1.626 m)   Wt 90.7 kg   SpO2 100%   BMI 34.33 kg/m   Physical Exam Constitutional:      General: She is not in acute distress.    Appearance: She is well-developed. She is not ill-appearing.  HENT:     Head: Normocephalic and atraumatic.  Eyes:     Extraocular Movements: Extraocular movements intact.     Pupils: Pupils are equal, round, and reactive to light. Pupils are equal.  Neck:     Musculoskeletal: Normal range of motion and neck supple. No neck rigidity.     Comments: Base of occiput tender to palpation, no midline tenderness along cervical spine Cardiovascular:     Rate and Rhythm: Normal rate and regular rhythm.  Pulmonary:     Effort: Pulmonary effort is normal.     Breath sounds: Normal breath sounds.  Abdominal:     General: Bowel sounds are normal.     Palpations: Abdomen is soft.  Musculoskeletal: Normal range of motion.  Skin:    General: Skin is warm and dry.  Neurological:     Mental Status: She is alert and oriented to person, place, and time.     Cranial Nerves: No cranial nerve deficit.     Sensory: No sensory deficit.     Motor: No weakness.  Psychiatric:        Mood and Affect: Mood normal.        Behavior: Behavior normal.      ED Treatments / Results  Labs (all labs ordered are listed, but only abnormal results are displayed) Labs Reviewed  CBC WITH DIFFERENTIAL/PLATELET  BASIC METABOLIC PANEL    EKG None  Radiology Ct Head Wo Contrast  Result Date: 12/18/2018 CLINICAL DATA:  Headache EXAM: CT HEAD WITHOUT CONTRAST TECHNIQUE: Contiguous axial images were obtained  from  the base of the skull through the vertex without intravenous contrast. COMPARISON:  None. FINDINGS: Brain: No evidence of acute territorial infarction, hemorrhage, hydrocephalus,extra-axial collection or mass lesion/mass effect. Normal gray-white differentiation. Ventricles are normal in size and contour. Vascular: No hyperdense vessel or unexpected calcification. Skull: The skull is intact. No fracture or focal lesion identified. Sinuses/Orbits: The visualized paranasal sinuses and mastoid air cells are clear. The orbits and globes intact. Other: None IMPRESSION: No acute intracranial abnormality. Electronically Signed   By: Prudencio Pair M.D.   On: 12/18/2018 20:04    Procedures Procedures (including critical care time)  Medications Ordered in ED Medications  prochlorperazine (COMPAZINE) injection 10 mg (10 mg Intravenous Given 12/18/18 1935)  diphenhydrAMINE (BENADRYL) injection 25 mg (25 mg Intravenous Given 12/18/18 1934)  dexamethasone (DECADRON) injection 4 mg (4 mg Intravenous Given 12/18/18 1933)  sodium chloride 0.9 % bolus 1,000 mL (1,000 mLs Intravenous New Bag/Given 12/18/18 1931)     Initial Impression / Assessment and Plan / ED Course  I have reviewed the triage vital signs and the nursing notes.  Pertinent labs & imaging results that were available during my care of the patient were reviewed by me and considered in my medical decision making (see chart for details).        Patient presents with likely complex migraine.  Patient has no neurological deficits on exam.  Has had normal habit imaging in the past.  Was sent by PCP for imaging, will pursue CT head without contrast and basic labs.  Will trial migraine cocktail to see if any improvement.  Interval update Head CT negative for acute abnormalities.  Patient headache improved after migraine cocktail.  Patient discharged home to follow-up with PCP or neurology for ongoing headaches.  Final Clinical Impressions(s) / ED  Diagnoses   Final diagnoses:  Migraine without status migrainosus, not intractable, unspecified migraine type    ED Discharge Orders    None       Bonnita Hollow, MD 12/18/18 2021    Drenda Freeze, MD 12/19/18 1500

## 2018-12-18 NOTE — ED Notes (Addendum)
Josephine Igo Resident MD at bedside. VORB to d/c BMET

## 2018-12-18 NOTE — ED Notes (Signed)
Patient transported to CT 

## 2018-12-18 NOTE — ED Notes (Signed)
Pt d/c home with ride 

## 2018-12-20 ENCOUNTER — Other Ambulatory Visit: Payer: Self-pay | Admitting: Obstetrics & Gynecology

## 2018-12-31 ENCOUNTER — Telehealth: Payer: Self-pay | Admitting: *Deleted

## 2018-12-31 NOTE — Telephone Encounter (Signed)
-----   Message from Emily Filbert, MD sent at 12/31/2018  9:09 AM EDT ----- She needs to see a fam med doc to take care of her hypothyroidism. Thanks

## 2018-12-31 NOTE — Telephone Encounter (Addendum)
I called Erika [name] and left a message we are calling with some important information from Dr.Dove about an appointment that we need to schedule with you. Please call our office. Per chart has PCP Linda,RN

## 2018-12-31 NOTE — Telephone Encounter (Signed)
I called Nuzhat and she confirms she goes to [facility] off [address] and that she is on thyroid medicine that Dr.Dove started and her doctor at [facility] has continued. I informed her I will forward this to Dr.Dove- that she wanted to make sure she had follow up for her hypothyroidism. She voices understanding and thanked me for the call.  Linda,RN

## 2019-07-06 ENCOUNTER — Emergency Department (HOSPITAL_BASED_OUTPATIENT_CLINIC_OR_DEPARTMENT_OTHER): Payer: Medicaid Other

## 2019-07-06 ENCOUNTER — Emergency Department (HOSPITAL_COMMUNITY): Payer: Medicaid Other

## 2019-07-06 ENCOUNTER — Emergency Department (HOSPITAL_BASED_OUTPATIENT_CLINIC_OR_DEPARTMENT_OTHER)
Admission: EM | Admit: 2019-07-06 | Discharge: 2019-07-06 | Disposition: A | Discharge status: Home / Self Care | Payer: Medicaid Other | Attending: Emergency Medicine | Admitting: Emergency Medicine

## 2019-07-06 ENCOUNTER — Encounter (HOSPITAL_BASED_OUTPATIENT_CLINIC_OR_DEPARTMENT_OTHER): Payer: Self-pay | Admitting: Emergency Medicine

## 2019-07-06 ENCOUNTER — Other Ambulatory Visit: Payer: Self-pay

## 2019-07-06 DIAGNOSIS — R55 Syncope and collapse: Secondary | ICD-10-CM | POA: Insufficient documentation

## 2019-07-06 DIAGNOSIS — Z79899 Other long term (current) drug therapy: Secondary | ICD-10-CM | POA: Diagnosis not present

## 2019-07-06 DIAGNOSIS — Z87891 Personal history of nicotine dependence: Secondary | ICD-10-CM | POA: Diagnosis not present

## 2019-07-06 DIAGNOSIS — R1031 Right lower quadrant pain: Secondary | ICD-10-CM

## 2019-07-06 DIAGNOSIS — N83209 Unspecified ovarian cyst, unspecified side: Secondary | ICD-10-CM | POA: Insufficient documentation

## 2019-07-06 DIAGNOSIS — R102 Pelvic and perineal pain: Secondary | ICD-10-CM

## 2019-07-06 DIAGNOSIS — E039 Hypothyroidism, unspecified: Secondary | ICD-10-CM | POA: Diagnosis not present

## 2019-07-06 DIAGNOSIS — N83201 Unspecified ovarian cyst, right side: Secondary | ICD-10-CM | POA: Insufficient documentation

## 2019-07-06 HISTORY — DX: Disorder of thyroid, unspecified: E07.9

## 2019-07-06 LAB — PREGNANCY, URINE: Preg Test, Ur: NEGATIVE

## 2019-07-06 LAB — CBC WITH DIFFERENTIAL/PLATELET
Abs Immature Granulocytes: 0.05 10*3/uL (ref 0.00–0.07)
Basophils Absolute: 0 10*3/uL (ref 0.0–0.1)
Basophils Relative: 0 %
Eosinophils Absolute: 0.2 10*3/uL (ref 0.0–0.5)
Eosinophils Relative: 1 %
HCT: 37.7 % (ref 36.0–46.0)
Hemoglobin: 12.7 g/dL (ref 12.0–15.0)
Immature Granulocytes: 0 %
Lymphocytes Relative: 19 %
Lymphs Abs: 2.5 10*3/uL (ref 0.7–4.0)
MCH: 28.5 pg (ref 26.0–34.0)
MCHC: 33.7 g/dL (ref 30.0–36.0)
MCV: 84.7 fL (ref 80.0–100.0)
Monocytes Absolute: 1.1 10*3/uL — ABNORMAL HIGH (ref 0.1–1.0)
Monocytes Relative: 9 %
Neutro Abs: 9.3 10*3/uL — ABNORMAL HIGH (ref 1.7–7.7)
Neutrophils Relative %: 71 %
Platelets: 214 10*3/uL (ref 150–400)
RBC: 4.45 MIL/uL (ref 3.87–5.11)
RDW: 13.5 % (ref 11.5–15.5)
WBC: 13.1 10*3/uL — ABNORMAL HIGH (ref 4.0–10.5)
nRBC: 0 % (ref 0.0–0.2)

## 2019-07-06 LAB — URINALYSIS, ROUTINE W REFLEX MICROSCOPIC
Bilirubin Urine: NEGATIVE
Glucose, UA: NEGATIVE mg/dL
Ketones, ur: 15 mg/dL — AB
Nitrite: NEGATIVE
Protein, ur: NEGATIVE mg/dL
Specific Gravity, Urine: 1.01 (ref 1.005–1.030)
pH: 5.5 (ref 5.0–8.0)

## 2019-07-06 LAB — COMPREHENSIVE METABOLIC PANEL
ALT: 19 U/L (ref 0–44)
AST: 15 U/L (ref 15–41)
Albumin: 4.2 g/dL (ref 3.5–5.0)
Alkaline Phosphatase: 59 U/L (ref 38–126)
Anion gap: 7 (ref 5–15)
BUN: 15 mg/dL (ref 6–20)
CO2: 20 mmol/L — ABNORMAL LOW (ref 22–32)
Calcium: 8.8 mg/dL — ABNORMAL LOW (ref 8.9–10.3)
Chloride: 105 mmol/L (ref 98–111)
Creatinine, Ser: 0.7 mg/dL (ref 0.44–1.00)
GFR calc Af Amer: >60 mL/min (ref >60)
GFR calc non Af Amer: >60 mL/min (ref >60)
Glucose, Bld: 106 mg/dL — ABNORMAL HIGH (ref 70–99)
Potassium: 3.6 mmol/L (ref 3.5–5.1)
Sodium: 132 mmol/L — ABNORMAL LOW (ref 135–145)
Total Bilirubin: 0.7 mg/dL (ref 0.3–1.2)
Total Protein: 7.4 g/dL (ref 6.5–8.1)

## 2019-07-06 LAB — URINALYSIS, MICROSCOPIC (REFLEX)

## 2019-07-06 LAB — WET PREP, GENITAL
Clue Cells Wet Prep HPF POC: NONE SEEN
Sperm: NONE SEEN
Trich, Wet Prep: NONE SEEN
Yeast Wet Prep HPF POC: NONE SEEN

## 2019-07-06 LAB — LIPASE, BLOOD: Lipase: 23 U/L (ref 11–51)

## 2019-07-06 MED ORDER — MORPHINE SULFATE (PF) 4 MG/ML IV SOLN
4.0000 mg | Freq: Once | INTRAVENOUS | Status: AC
  Administered 2019-07-06: 05:00:00 4 mg via INTRAVENOUS
  Filled 2019-07-06: qty 1

## 2019-07-06 MED ORDER — OXYCODONE-ACETAMINOPHEN 5-325 MG PO TABS
1.0000 | ORAL_TABLET | Freq: Once | ORAL | Status: AC
  Administered 2019-07-06: 1 via ORAL
  Filled 2019-07-06: qty 1

## 2019-07-06 MED ORDER — IBUPROFEN 600 MG PO TABS: 600.0000 mg | ORAL_TABLET | Freq: Four times a day (QID) | ORAL | 0 refills | Status: DC | PRN

## 2019-07-06 MED ORDER — MORPHINE SULFATE (PF) 4 MG/ML IV SOLN
4.0000 mg | Freq: Once | INTRAVENOUS | Status: AC
  Administered 2019-07-06: 4 mg via INTRAVENOUS
  Filled 2019-07-06: qty 1

## 2019-07-06 MED ORDER — SENNOSIDES-DOCUSATE SODIUM 8.6-50 MG PO TABS: 1.0000 | ORAL_TABLET | Freq: Every day | ORAL | 0 refills | Status: AC

## 2019-07-06 MED ORDER — KETOROLAC TROMETHAMINE 30 MG/ML IJ SOLN
30.0000 mg | Freq: Once | INTRAMUSCULAR | Status: AC
  Administered 2019-07-06: 30 mg via INTRAVENOUS
  Filled 2019-07-06: qty 1

## 2019-07-06 MED ORDER — SODIUM CHLORIDE 0.9 % IV BOLUS (SEPSIS)
1000.0000 mL | Freq: Once | INTRAVENOUS | Status: AC
  Administered 2019-07-06: 1000 mL via INTRAVENOUS

## 2019-07-06 MED ORDER — IBUPROFEN 400 MG PO TABS
600.0000 mg | ORAL_TABLET | Freq: Once | ORAL | Status: AC
  Administered 2019-07-06: 600 mg via ORAL
  Filled 2019-07-06: qty 1

## 2019-07-06 MED ORDER — ONDANSETRON HCL 4 MG/2ML IJ SOLN
4.0000 mg | Freq: Once | INTRAMUSCULAR | Status: AC
  Administered 2019-07-06: 4 mg via INTRAVENOUS
  Filled 2019-07-06: qty 2

## 2019-07-06 MED ORDER — OXYCODONE-ACETAMINOPHEN 5-325 MG PO TABS: 1.0000 | ORAL_TABLET | Freq: Four times a day (QID) | ORAL | 0 refills | Status: DC | PRN
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
## 2019-07-06 NOTE — ED Triage Notes (Signed)
Abd pain since yesterday, radiates to right flank. No n/v but diarrhea. States " fell out" last week and has a lot of stuff going on with her

## 2019-07-06 NOTE — ED Provider Notes (Signed)
Clinical Course as of Jul 05 999  Wed Jul 06, 2019  [id number] IMPRESSION: 1. 2.8 cm right ovarian cyst which is hemorrhagic by a preceding abdominal CT. 2. Additional cystic structure at the right adnexa with some tubular features and possible endosalpingeal folds. Question hydrosalpinx with internal debris. 3. Normal ovarian blood flow on both sides. 4. Probable adenomyosis.   [MT]  [phone] I spoke to the GYN attending on call who reviewed the images and felt this presentation was consistent with a ruptured cyst, particularly in the abscence of any indications or signs of infection down here.  He recommended outpatient f/u in 1 month for repeat ultrasound imaging.  His office will reach out to the patient to help set this up.  I relayed this to the patient.  She is feeling overall better, her pain improved from 10/10 on arrival to 4/10 now.  She feels she can go home.  I told her I'd prescribe 3 days of percocet with longer course of motrin, and I expect her pain to continue to gradually improve over the next 2 days if this was a ruptured cyst.   If her pain worsens, she can return to the ER.   [MT]    Clinical Course User Index [MT] Sena Clouatre, Carola Rhine, MD      Wyvonnia Dusky, MD 07/06/19 1001

## 2019-07-06 NOTE — ED Provider Notes (Signed)
Patient transferred from [facility] for rule out torsion.  On my evaluation she continues to endorse pain.  Rates her pain at 8 out of 10.  Otherwise is hemodynamically stable.  Ultrasound ordered.  Will redose pain medication.   Physical Exam  BP 113/66   Pulse 93   Temp 98.4 F (36.9 C) (Oral)   Resp 17   Ht 1.626 m (5\' 4" )   Wt 90.7 kg   SpO2 100%   BMI 34.33 kg/m   Physical Exam Awake, alert, no acute distress Right lower quadrant tenderness to palpation        Merryl Hacker, MD 07/06/19 [phone]

## 2019-07-06 NOTE — ED Notes (Signed)
Report given to Casey RN with [facility] ?

## 2019-07-06 NOTE — Discharge Instructions (Addendum)
You should hear back from the [facility] in the next 1-2 days about setting up your next GYN office visit.  If you do not hear from them, please call Dr Alease Medina office at the number listed above.  We expect your next appointment will happen in about 4 weeks, at which point you may need another ultrasound to look at your ovaries.  In the meantime, please continue taking motrin 600 mg every 6 hours for the next 5-7 days.  Try to take this with food.  Drink plenty of water.  You can take percocet for severe pain for the next 1-3 days.   I expect your pain to improve over the next 2-3 days at home.  Please take the time to read over the attached information about ovarian cysts.

## 2019-07-06 NOTE — ED Notes (Signed)
Patient transported to CT 

## 2019-07-06 NOTE — ED Notes (Signed)
Report called to Cruzita Lederer, charge nurse at [facility] ED

## 2019-07-07 LAB — GC/CHLAMYDIA PROBE AMP (~~LOC~~) NOT AT ARMC
Chlamydia: NEGATIVE
Comment: NEGATIVE
Comment: NORMAL
Neisseria Gonorrhea: NEGATIVE

## 2019-07-28 ENCOUNTER — Encounter: Payer: Self-pay | Admitting: Obstetrics & Gynecology

## 2019-07-28 ENCOUNTER — Ambulatory Visit (INDEPENDENT_AMBULATORY_CARE_PROVIDER_SITE_OTHER): Payer: Medicaid Other | Admitting: Obstetrics & Gynecology

## 2019-07-28 ENCOUNTER — Other Ambulatory Visit: Payer: Self-pay

## 2019-07-28 VITALS — BP 108/63 | HR 75 | Ht 63.0 in | Wt 210.0 lb

## 2019-07-28 DIAGNOSIS — N83291 Other ovarian cyst, right side: Secondary | ICD-10-CM

## 2019-07-28 DIAGNOSIS — R102 Pelvic and perineal pain: Secondary | ICD-10-CM | POA: Diagnosis not present

## 2019-07-28 DIAGNOSIS — N83201 Unspecified ovarian cyst, right side: Secondary | ICD-10-CM

## 2019-07-28 NOTE — Progress Notes (Signed)
Follow up from ED visit from Jul 06, 2019. Imaging done- Patient had ruptured ovarian cyst. Possible adenomyosis. Patient also complaining of painful intercourse since this incident. Patient states that she had last pap smear in Feb. 2021- hx of of abnormal pap smears  Kathrene Alu RN

## 2019-07-28 NOTE — Progress Notes (Signed)
History:  39 y.o. [id number] here today for f/u of ED visit. Pt was seen on 07/06/2019 for severe pelvic pain.  Pt was dx'd with ruptured ov cyst. She reports continued pain with intercourse however the pain overall is much improved since her ED visit. She is not taking pain meds at present.     The following portions of the patient's history were reviewed and updated as appropriate: allergies, current medications, past family history, past medical history, past social history, past surgical history and problem list.  Review of Systems:  Pertinent items are noted in HPI.    Objective:  Physical Exam Blood pressure 108/63, pulse 75, height 5\' 3"  (1.6 m), weight 210 lb (95.3 kg), last menstrual period 07/13/2019.  CONSTITUTIONAL: Well-developed, well-nourished female in no acute distress.  HENT:  Normocephalic, atraumatic EYES: Conjunctivae and EOM are normal. No scleral icterus.  NECK: Normal range of motion SKIN: Skin is warm and dry. No rash noted. Not diaphoretic.No pallor. [place]: Alert and oriented to person, place, and time. Normal coordination.  Abd: Soft, nontender and nondistended Pelvic: Normal appearing external genitalia; normal appearing vaginal mucosa and cervix.  Normal discharge.  Small uterus, no other palpable masses, no uterine or adnexal tenderness  Labs and Imaging CT Renal Stone Study  Result Date: 07/06/2019 CLINICAL DATA:  Sudden onset right flank pain kidney stone suspected EXAM: CT ABDOMEN AND PELVIS WITHOUT CONTRAST TECHNIQUE: Multidetector CT imaging of the abdomen and pelvis was performed following the standard protocol without IV contrast. COMPARISON:  03/13/2013 FINDINGS: Lower chest:  No contributory findings. Hepatobiliary: Hepatic steatosis.Cholecystectomy. No bile duct dilatation Pancreas: Unremarkable. Spleen: Unremarkable. Adrenals/Urinary Tract: Negative adrenals. No hydronephrosis or stone. 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 Unremarkable bladder.  Stomach/Bowel: No obstruction. No appendicitis. Sigmoid diverticulosis. Vascular/Lymphatic: No acute vascular abnormality. No mass or adenopathy. Reproductive:3.5 cm cystic density in the right ovary with layering high-density component. The regional soft tissues are infiltrated and there is small volume pelvic fluid. The non cystic portions of the right ovary do not appear clearly thickened or edematous and there is no gross twisting of the vascular pedicle. Small bowel loops are closely located to the right ovary, without thickening. Other: No ascites or pneumoperitoneum. Musculoskeletal: No acute abnormalities. IMPRESSION: 1. 3.5 cm right ovarian cystic structure with layering hemorrhage. The adjacent soft tissues are infiltrated and there may be leakage. This is most likely the symptomatic finding. 2. No hydronephrosis or urinary calculus. 3. Hepatic steatosis and colonic diverticulosis. Electronically Signed   By: Monte Fantasia M.D.   On: 07/06/2019 04:52   US PELVIC COMPLETE W TRANSVAGINAL AND TORSION R/O  Result Date: 07/06/2019 CLINICAL DATA:  Right lower quadrant pain. EXAM: TRANSABDOMINAL AND TRANSVAGINAL ULTRASOUND OF PELVIS DOPPLER ULTRASOUND OF OVARIES TECHNIQUE: Both transabdominal and transvaginal ultrasound examinations of the pelvis were performed. Transabdominal technique was performed for global imaging of the pelvis including uterus, ovaries, adnexal regions, and pelvic cul-de-sac. It was necessary to proceed with endovaginal exam following the transabdominal exam to visualize the ovaries. Color and duplex Doppler ultrasound was utilized to evaluate blood flow to the ovaries. COMPARISON:  Abdominal CT earlier today. Pelvic ultrasound 04/14/2018 FINDINGS: Uterus Measurements: 8 x 4 x 5 cm = volume: 90 mL. No fibroids or other mass visualized. Endometrium Thickness: 12 mm.  Subendometrial cysts suggesting adenomyosis. Right ovary Measurements: 4.2 x 3.4 x 3.3 cm = volume: 25 mL. 2.8 cm cystic  structure in the ovary correlating with hemorrhagic cyst on prior CT. There is an additional  irregularly-shaped cystic structure with mid level echoes and apparent continuation as a tubular structure extending towards the uterus. At the level of dominant cystic component there may be endosalpingeal folds. Left ovary Measurements: 2.7 x 2.8 x 1.8 cm = volume: 7 cm mL. Normal appearance/no adnexal mass. Pulsed Doppler evaluation of both ovaries demonstrates normal low-resistance arterial and venous waveforms. Other findings No abnormal free fluid. IMPRESSION: 1. 2.8 cm right ovarian cyst which is hemorrhagic by a preceding abdominal CT. 2. Additional cystic structure at the right adnexa with some tubular features and possible endosalpingeal folds. Question hydrosalpinx with internal debris. 3. Normal ovarian blood flow on both sides. 4. Probable adenomyosis. Electronically Signed   By: Monte Fantasia M.D.   On: 07/06/2019 07:36    Assessment & Plan:  Pelvic pain/ov cyst: sx improved.   Repeat US in 3 months   Pt wants virtual f/u.   Total face-to-face time with patient was 30 min.  Greater than 50% was spent in counseling and coordination of care with the patient.   Kalyn Hofstra L. Harraway-Smith, M.D., Cherlynn June

## 2019-10-07 ENCOUNTER — Other Ambulatory Visit: Payer: Self-pay

## 2019-10-07 ENCOUNTER — Ambulatory Visit (HOSPITAL_BASED_OUTPATIENT_CLINIC_OR_DEPARTMENT_OTHER): Admission: RE | Admit: 2019-10-07 | Payer: Medicaid Other | Source: Ambulatory Visit

## 2019-10-07 DIAGNOSIS — R102 Pelvic and perineal pain unspecified side: Secondary | ICD-10-CM

## 2019-10-12 ENCOUNTER — Telehealth: Payer: Medicaid Other | Admitting: Obstetrics & Gynecology

## 2020-02-05 IMAGING — CT CT HEAD W/O CM
3 series · 15 of 47 positions shown, 18 images · non-contrast
Comparison: None.

CLINICAL DATA: Headache

EXAM:
CT HEAD WITHOUT CONTRAST
TECHNIQUE: Contiguous axial images were obtained from the base of the skull
through the vertex without intravenous contrast.

[Series 2: head wo · axial · 0.41mm/px · z∈[+558,+682]mm · 9 of 31 slices shown, 12 images]
[im 3/31  brain]
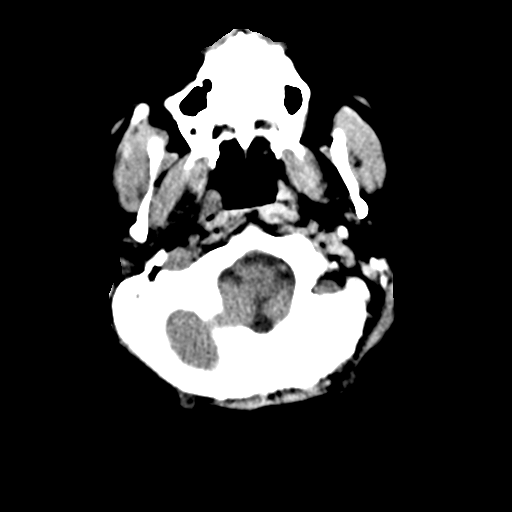
[im 3/31  bone]
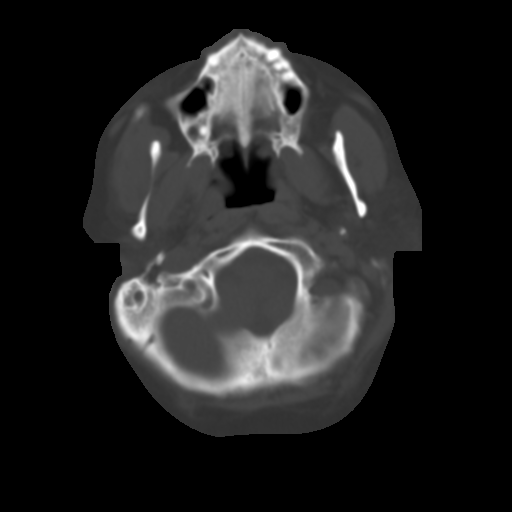
[im 6/31  brain]
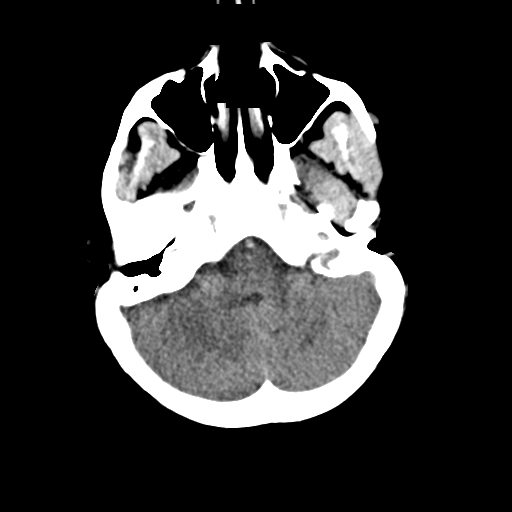
[im 9/31  brain]
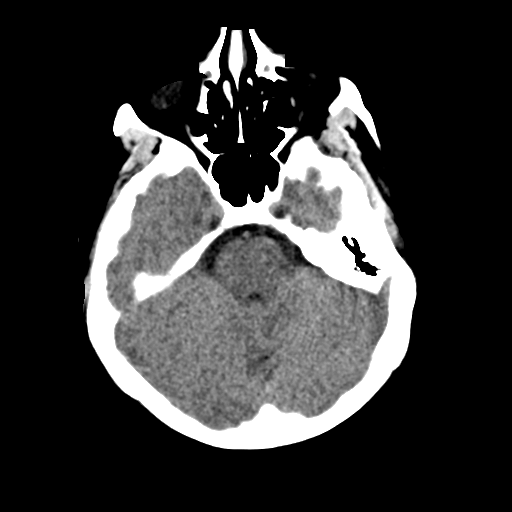
[im 12/31  brain]
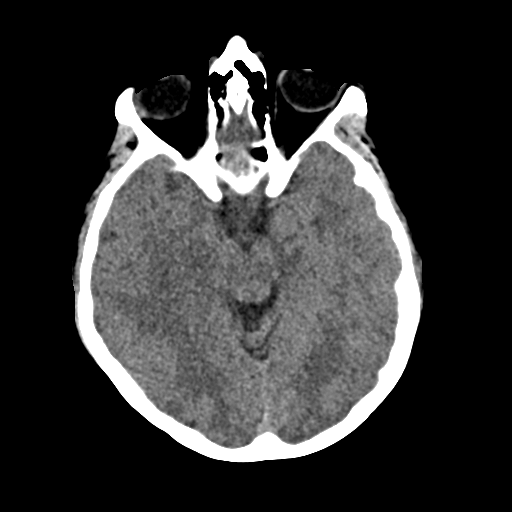
[im 16/31  brain]
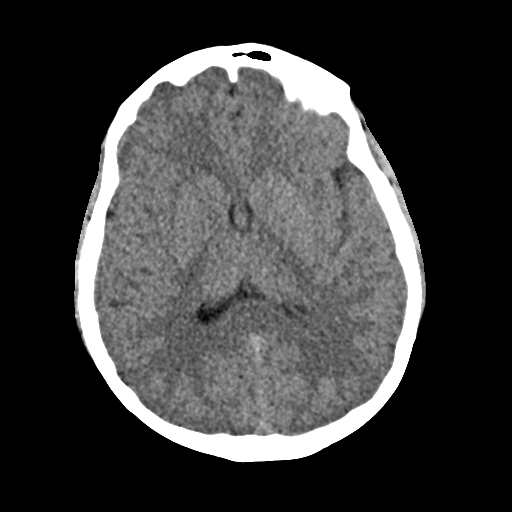
[im 16/31  bone]
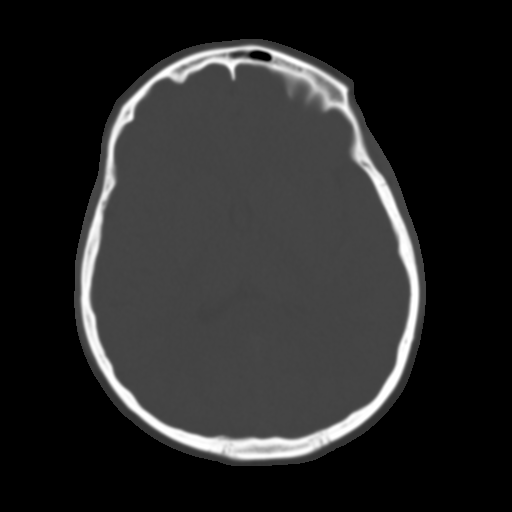
[im 19/31  brain]
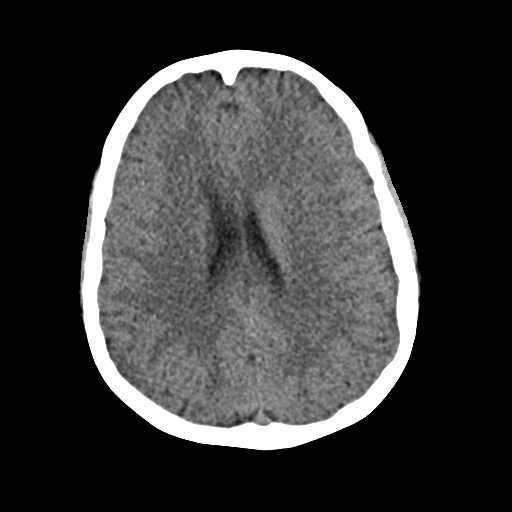
[im 22/31  brain]
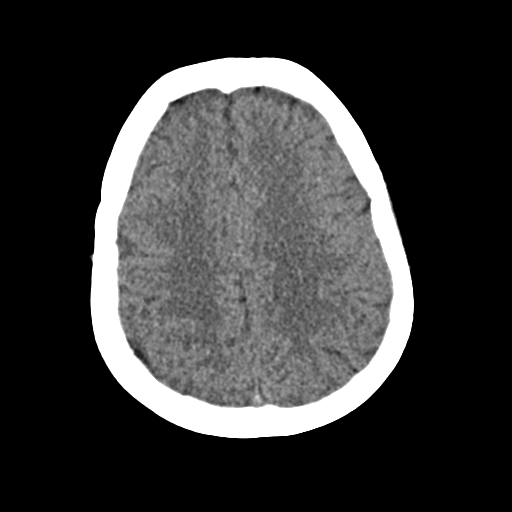
[im 25/31  brain]
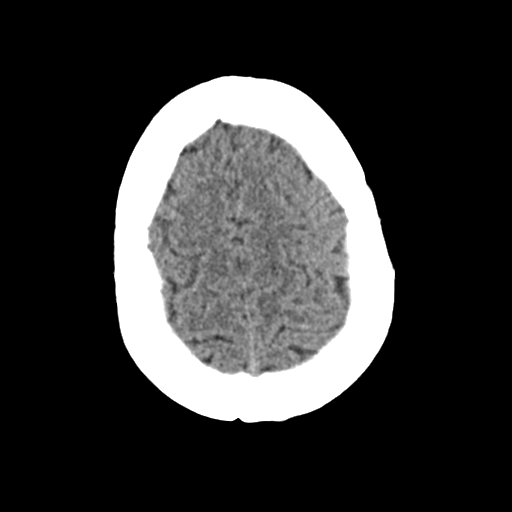
[im 28/31  brain]
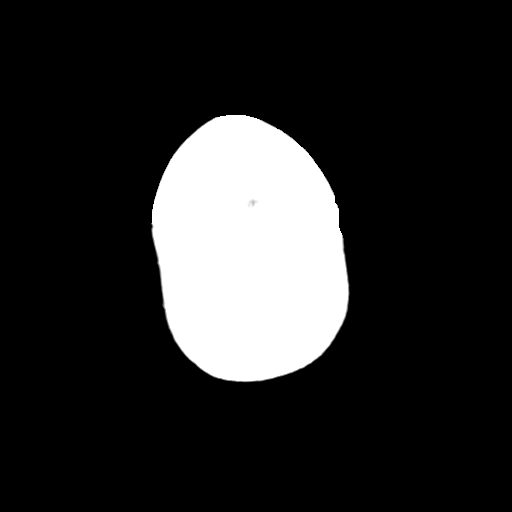
[im 28/31  bone]
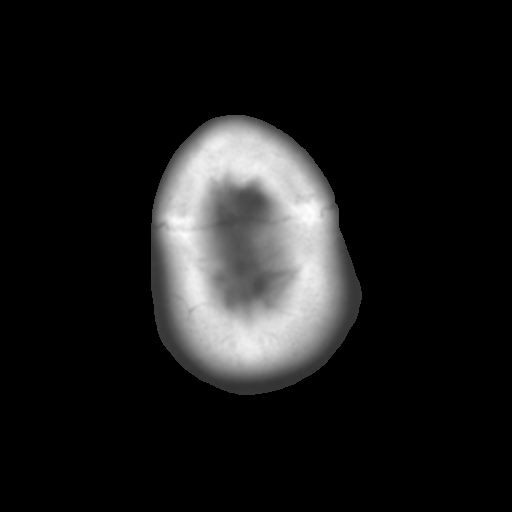

[Series 4: coronal soft · coronal · 0.29mm/px · 3 of 67 slices shown]
[im 23/67  brain]
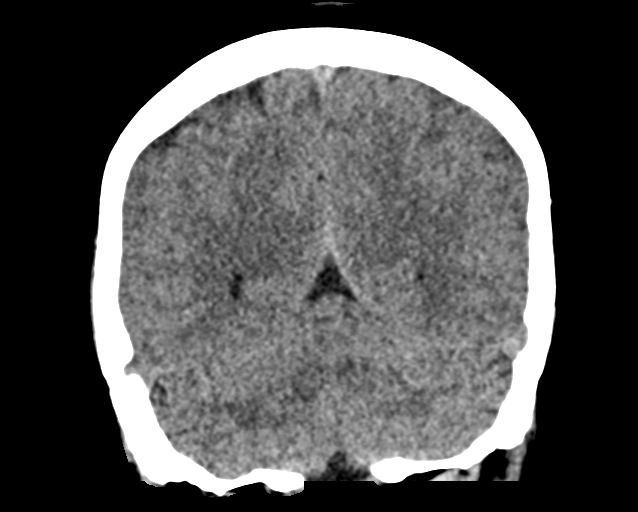
[im 30/67  brain]
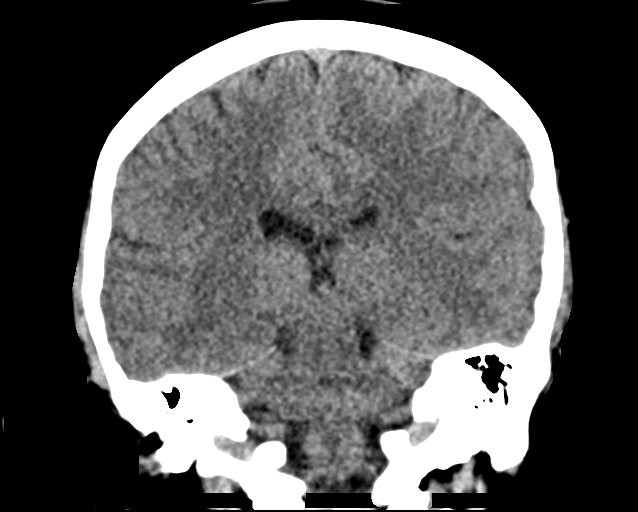
[im 37/67  brain]
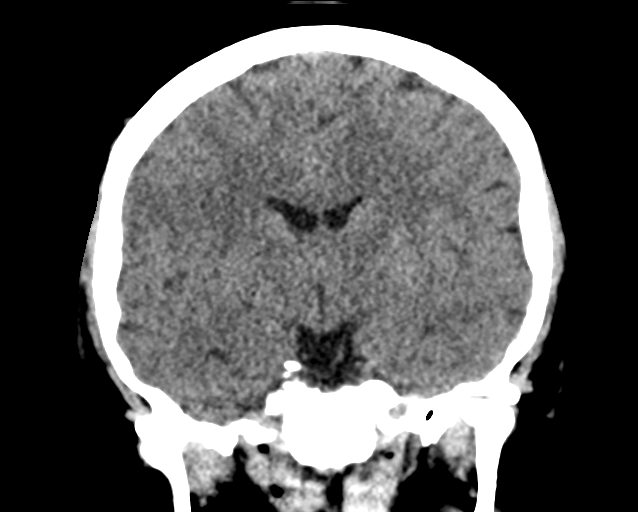

[Series 5: sag soft · sagittal · 0.29mm/px · 3 of 40 slices shown]
[im 14/40  brain]
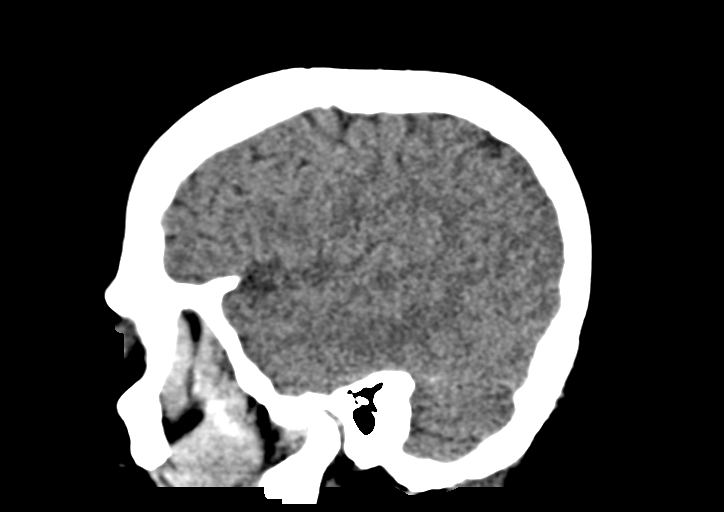
[im 20/40  brain]
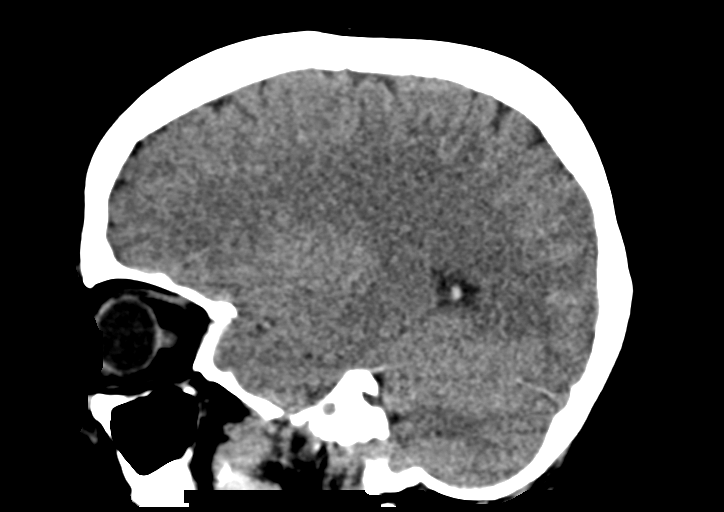
[im 27/40  brain]
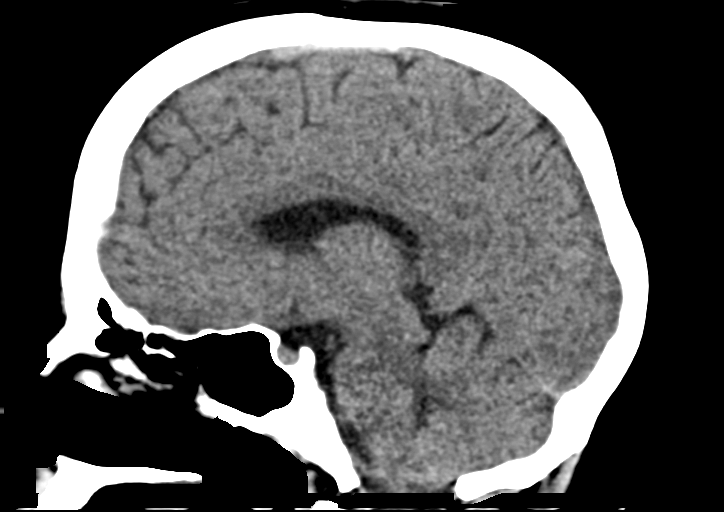

[15 of 47 positions shown; findings below may reference images not displayed]

FINDINGS: Brain: No evidence of acute territorial infarction, hemorrhage,
hydrocephalus,extra-axial collection or mass lesion/mass effect.
Normal gray-white differentiation. Ventricles are normal in size and
contour.

Vascular: No hyperdense vessel or unexpected calcification.

Skull: The skull is intact. No fracture or focal lesion identified.

Sinuses/Orbits: The visualized paranasal sinuses and mastoid air
cells are clear. The orbits and globes intact.

Other: None
IMPRESSION: No acute intracranial abnormality.

## 2021-07-01 HISTORY — PX: LAPAROSCOPIC GASTRIC SLEEVE RESECTION: SHX5895

## 2022-02-28 ENCOUNTER — Emergency Department (HOSPITAL_BASED_OUTPATIENT_CLINIC_OR_DEPARTMENT_OTHER): Payer: Medicaid Other

## 2022-02-28 ENCOUNTER — Other Ambulatory Visit: Payer: Self-pay

## 2022-02-28 ENCOUNTER — Emergency Department (HOSPITAL_BASED_OUTPATIENT_CLINIC_OR_DEPARTMENT_OTHER)
Admission: EM | Admit: 2022-02-28 | Discharge: 2022-02-28 | Disposition: A | Discharge status: Home / Self Care | Payer: Medicaid Other | Attending: Emergency Medicine | Admitting: Emergency Medicine

## 2022-02-28 ENCOUNTER — Encounter (HOSPITAL_BASED_OUTPATIENT_CLINIC_OR_DEPARTMENT_OTHER): Payer: Self-pay | Admitting: Pediatrics

## 2022-02-28 DIAGNOSIS — Y998 Other external cause status: Secondary | ICD-10-CM | POA: Insufficient documentation

## 2022-02-28 DIAGNOSIS — S060XAA Concussion with loss of consciousness status unknown, initial encounter: Secondary | ICD-10-CM

## 2022-02-28 DIAGNOSIS — S161XXA Strain of muscle, fascia and tendon at neck level, initial encounter: Secondary | ICD-10-CM | POA: Insufficient documentation

## 2022-02-28 DIAGNOSIS — Y9389 Activity, other specified: Secondary | ICD-10-CM | POA: Diagnosis not present

## 2022-02-28 DIAGNOSIS — S0990XA Unspecified injury of head, initial encounter: Secondary | ICD-10-CM | POA: Diagnosis present

## 2022-02-28 DIAGNOSIS — Y92410 Unspecified street and highway as the place of occurrence of the external cause: Secondary | ICD-10-CM | POA: Diagnosis not present

## 2022-02-28 MED ORDER — ACETAMINOPHEN 500 MG PO TABS
1000.0000 mg | ORAL_TABLET | Freq: Once | ORAL | Status: AC
  Administered 2022-02-28: 1000 mg via ORAL
  Filled 2022-02-28: qty 2

## 2022-02-28 MED ORDER — PROCHLORPERAZINE MALEATE 10 MG PO TABS
10.0000 mg | ORAL_TABLET | Freq: Once | ORAL | Status: AC
  Administered 2022-02-28: 10 mg via ORAL
  Filled 2022-02-28: qty 1

## 2022-02-28 MED ORDER — DEXAMETHASONE 4 MG PO TABS
10.0000 mg | ORAL_TABLET | Freq: Once | ORAL | Status: AC
  Administered 2022-02-28: 10 mg via ORAL
  Filled 2022-02-28: qty 3
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
## 2022-02-28 NOTE — Discharge Instructions (Addendum)
You were seen in the ER today for evaluation of your head and neck pain. Your CT imaging is unremarkable. I am glad you are feeling better after the medication. I would like for you to take '1000mg'$  of Tylenol every 6 hours. Additionally, I would like you to take the muscle relaxer you were prescribed by your PCP. Please be careful with driving or operating heavy machinery as this can cause sleepiness. I placed the information for [place] [facility] for you to follow up with. Please call to schedule an appointment. Please give yourself some brain rest over the next few days. Please make sure you are staying well hydrated with plenty of fluids, mainly water. If you have any concerns, new or worsening symptoms, please return to the ER for evaluation.   Get help right away if: You have: A very bad headache that is not helped by medicine. Trouble walking or weakness in your arms and legs. Clear or bloody fluid coming from your nose or ears. Changes in how you see (vision). A seizure. More confusion or more grumpy moods. Your symptoms get worse. You are sleepier than normal and have trouble staying awake. You lose your balance. The black centers of your eyes (pupils) change in size. Your speech is slurred. Your dizziness gets worse. You vomit. These symptoms may be an emergency. Do not wait to see if the symptoms will go away. Get medical help right away. Call your local emergency services (911 in the [place] Do not drive yourself to the hospital.

## 2022-02-28 NOTE — ED Notes (Signed)
Pt provided discharge instructions and prescription information. Pt was given the opportunity to ask questions and questions were answered.   
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
## 2022-03-06 ENCOUNTER — Ambulatory Visit (INDEPENDENT_AMBULATORY_CARE_PROVIDER_SITE_OTHER): Payer: Medicaid Other | Admitting: Family Medicine

## 2022-03-06 VITALS — BP 104/72 | HR 58 | Ht 64.0 in | Wt 172.0 lb

## 2022-03-06 DIAGNOSIS — M542 Cervicalgia: Secondary | ICD-10-CM

## 2022-03-06 DIAGNOSIS — R42 Dizziness and giddiness: Secondary | ICD-10-CM | POA: Diagnosis not present

## 2022-03-06 DIAGNOSIS — E041 Nontoxic single thyroid nodule: Secondary | ICD-10-CM | POA: Diagnosis not present

## 2022-03-06 DIAGNOSIS — S060X9A Concussion with loss of consciousness of unspecified duration, initial encounter: Secondary | ICD-10-CM

## 2022-03-06 MED ORDER — SUMATRIPTAN SUCCINATE 50 MG PO TABS: 50.0000 mg | ORAL_TABLET | ORAL | 0 refills | Status: AC | PRN

## 2022-03-06 MED ORDER — TOPIRAMATE 25 MG PO TABS: 25.0000 mg | ORAL_TABLET | Freq: Two times a day (BID) | ORAL | 1 refills | Status: DC

## 2022-03-06 MED ORDER — PROMETHAZINE HCL 25 MG PO TABS: 25.0000 mg | ORAL_TABLET | Freq: Four times a day (QID) | ORAL | 2 refills | Status: DC | PRN

## 2022-03-06 MED ORDER — CYCLOBENZAPRINE HCL 5 MG PO TABS: 5.0000 mg | ORAL_TABLET | Freq: Every evening | ORAL | 1 refills | Status: DC | PRN

## 2022-03-06 NOTE — Patient Instructions (Signed)
Thank you for coming in today.   I've referred you to Physical Therapy.  Let us know if you don't hear from them in one week.   Restart topamax twice daily.  Use sumatriptan as needed.   Use flexeril at bedtime as needed.   Use phenergan as needed for nasusea.   Recheck in 2-3 weeks.   [facility] should call you to schedule the thyroid ultrasound to follow up the thyroid nodule.
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
## 2022-03-12 ENCOUNTER — Ambulatory Visit
Admission: RE | Admit: 2022-03-12 | Discharge: 2022-03-12 | Disposition: A | Discharge status: Home / Self Care | Payer: Medicaid Other | Source: Ambulatory Visit | Attending: Family Medicine | Admitting: Family Medicine

## 2022-03-12 DIAGNOSIS — E041 Nontoxic single thyroid nodule: Secondary | ICD-10-CM

## 2022-03-17 ENCOUNTER — Telehealth: Payer: Self-pay | Admitting: Family Medicine

## 2022-03-17 DIAGNOSIS — E041 Nontoxic single thyroid nodule: Secondary | ICD-10-CM

## 2022-03-17 NOTE — Progress Notes (Signed)
Thyroid ultrasound shows that the nodule is abnormal enough appearing that they would like to do a biopsy.  I have referred you to interventional radiology for thyroid biopsy.  You should hear soon about scheduling.  Please let me know if you do not hear anything in the next week.

## 2022-03-17 NOTE — Telephone Encounter (Signed)
Referred to interventional radiology for biopsy.
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
## 2022-03-20 ENCOUNTER — Ambulatory Visit: Payer: Medicaid Other | Admitting: Family Medicine

## 2022-03-20 VITALS — BP 106/78 | HR 63 | Wt 169.0 lb

## 2022-03-20 DIAGNOSIS — F319 Bipolar disorder, unspecified: Secondary | ICD-10-CM

## 2022-03-20 DIAGNOSIS — S060X9D Concussion with loss of consciousness of unspecified duration, subsequent encounter: Secondary | ICD-10-CM

## 2022-03-20 DIAGNOSIS — E041 Nontoxic single thyroid nodule: Secondary | ICD-10-CM

## 2022-03-20 DIAGNOSIS — R42 Dizziness and giddiness: Secondary | ICD-10-CM

## 2022-03-20 DIAGNOSIS — M542 Cervicalgia: Secondary | ICD-10-CM | POA: Diagnosis not present

## 2022-03-20 MED ORDER — NORTRIPTYLINE HCL 10 MG PO CAPS: 10.0000 mg | ORAL_CAPSULE | Freq: Every day | ORAL | 3 refills | Status: DC

## 2022-03-20 NOTE — Patient Instructions (Addendum)
Thank you for coming in today.   Phone number to interventional radiology is [phone] option 1 then ask for thyroid biopsy schedule.   Start nortryptline at bedtime.   Continue PT.   Recheck in 3 weeks.

## 2022-04-10 ENCOUNTER — Other Ambulatory Visit (HOSPITAL_COMMUNITY)
Admission: RE | Admit: 2022-04-10 | Discharge: 2022-04-10 | Disposition: A | Discharge status: Home / Self Care | Payer: Medicaid Other | Source: Ambulatory Visit | Attending: Family Medicine | Admitting: Family Medicine

## 2022-04-10 ENCOUNTER — Encounter: Payer: Medicaid Other | Admitting: Sports Medicine

## 2022-04-10 ENCOUNTER — Ambulatory Visit
Admission: RE | Admit: 2022-04-10 | Discharge: 2022-04-10 | Disposition: A | Discharge status: Home / Self Care | Payer: Medicaid Other | Source: Ambulatory Visit | Attending: Family Medicine | Admitting: Family Medicine

## 2022-04-10 DIAGNOSIS — D34 Benign neoplasm of thyroid gland: Secondary | ICD-10-CM | POA: Diagnosis not present

## 2022-04-10 DIAGNOSIS — E041 Nontoxic single thyroid nodule: Secondary | ICD-10-CM | POA: Diagnosis present
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
## 2022-04-11 ENCOUNTER — Ambulatory Visit: Payer: Medicaid Other | Admitting: Family Medicine

## 2022-04-11 VITALS — BP 98/64 | HR 67 | Ht 64.0 in | Wt 171.0 lb

## 2022-04-11 DIAGNOSIS — E041 Nontoxic single thyroid nodule: Secondary | ICD-10-CM

## 2022-04-11 DIAGNOSIS — R42 Dizziness and giddiness: Secondary | ICD-10-CM | POA: Diagnosis not present

## 2022-04-11 DIAGNOSIS — S060X9D Concussion with loss of consciousness of unspecified duration, subsequent encounter: Secondary | ICD-10-CM

## 2022-04-11 NOTE — Patient Instructions (Addendum)
Thank you for coming in today.   Try meclizine for dizzy symptoms or even nausea at times.   We will switch PT to vestibular PT.   I will let you know about the thyroid biopsy results when I know them and what we are going to do about it.   Check back in 1 month

## 2022-04-14 ENCOUNTER — Encounter: Payer: Self-pay | Admitting: Family Medicine

## 2022-04-14 LAB — CYTOLOGY - NON PAP

## 2022-04-14 NOTE — Progress Notes (Signed)
Pathology results show benign follicular nodule.  This is reassuring and not cancer.  Will talk about the results in more detail when you return next month.

## 2022-05-08 ENCOUNTER — Ambulatory Visit: Payer: Medicaid Other | Admitting: Family Medicine

## 2022-05-08 VITALS — BP 96/58 | HR 62 | Ht 64.0 in | Wt 166.0 lb

## 2022-05-08 DIAGNOSIS — R42 Dizziness and giddiness: Secondary | ICD-10-CM | POA: Diagnosis not present

## 2022-05-08 DIAGNOSIS — S060X9D Concussion with loss of consciousness of unspecified duration, subsequent encounter: Secondary | ICD-10-CM | POA: Diagnosis not present
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
## 2022-05-08 NOTE — Patient Instructions (Addendum)
Thank you for coming in today.   Recheck in 2 months.   Let me know if you have a problem.   Pay attention to your blood pressure.   I am happy to you are improving.
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
## 2022-07-08 ENCOUNTER — Encounter: Payer: Medicaid Other | Admitting: Sports Medicine

## 2022-07-10 ENCOUNTER — Ambulatory Visit: Payer: Medicaid Other | Admitting: Family Medicine

## 2022-07-10 ENCOUNTER — Encounter: Payer: Self-pay | Admitting: Family Medicine

## 2022-07-10 VITALS — BP 102/80 | HR 72 | Ht 64.0 in | Wt 161.0 lb

## 2022-07-10 DIAGNOSIS — H539 Unspecified visual disturbance: Secondary | ICD-10-CM

## 2022-07-10 DIAGNOSIS — S060X9D Concussion with loss of consciousness of unspecified duration, subsequent encounter: Secondary | ICD-10-CM

## 2022-07-10 MED ORDER — CYCLOBENZAPRINE HCL 5 MG PO TABS: 5.0000 mg | ORAL_TABLET | Freq: Every evening | ORAL | 12 refills | Status: AC | PRN

## 2022-07-10 MED ORDER — PROMETHAZINE HCL 25 MG PO TABS: 25.0000 mg | ORAL_TABLET | Freq: Four times a day (QID) | ORAL | 12 refills | Status: AC | PRN
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
## 2022-07-10 NOTE — Patient Instructions (Addendum)
Thank you for coming in today.   Recheck in 3 months.   Continue current rehab on your own.   Continue current medicines.   We will ask [employer] in [place] for [employer]

## 2022-10-04 ENCOUNTER — Emergency Department (HOSPITAL_BASED_OUTPATIENT_CLINIC_OR_DEPARTMENT_OTHER)
Admission: EM | Admit: 2022-10-04 | Discharge: 2022-10-04 | Disposition: A | Discharge status: Home / Self Care | Payer: Medicaid Other | Attending: Emergency Medicine | Admitting: Emergency Medicine

## 2022-10-04 ENCOUNTER — Other Ambulatory Visit: Payer: Self-pay

## 2022-10-04 ENCOUNTER — Encounter (HOSPITAL_BASED_OUTPATIENT_CLINIC_OR_DEPARTMENT_OTHER): Payer: Self-pay

## 2022-10-04 DIAGNOSIS — M62838 Other muscle spasm: Secondary | ICD-10-CM

## 2022-10-04 DIAGNOSIS — R252 Cramp and spasm: Secondary | ICD-10-CM | POA: Insufficient documentation

## 2022-10-04 LAB — CBC
HCT: 35.3 % — ABNORMAL LOW (ref 36.0–46.0)
Hemoglobin: 11.6 g/dL — ABNORMAL LOW (ref 12.0–15.0)
MCH: 28.4 pg (ref 26.0–34.0)
MCHC: 32.9 g/dL (ref 30.0–36.0)
MCV: 86.5 fL (ref 80.0–100.0)
Platelets: 179 10*3/uL (ref 150–400)
RBC: 4.08 MIL/uL (ref 3.87–5.11)
RDW: 13 % (ref 11.5–15.5)
WBC: 6.3 10*3/uL (ref 4.0–10.5)
nRBC: 0 % (ref 0.0–0.2)

## 2022-10-04 LAB — COMPREHENSIVE METABOLIC PANEL
ALT: 22 U/L (ref 0–44)
AST: 21 U/L (ref 15–41)
Albumin: 4.1 g/dL (ref 3.5–5.0)
Alkaline Phosphatase: 77 U/L (ref 38–126)
Anion gap: 10 (ref 5–15)
BUN: 15 mg/dL (ref 6–20)
CO2: 24 mmol/L (ref 22–32)
Calcium: 8.8 mg/dL — ABNORMAL LOW (ref 8.9–10.3)
Chloride: 102 mmol/L (ref 98–111)
Creatinine, Ser: 0.73 mg/dL (ref 0.44–1.00)
GFR, Estimated: >60 mL/min (ref >60)
Glucose, Bld: 90 mg/dL (ref 70–99)
Potassium: 4 mmol/L (ref 3.5–5.1)
Sodium: 136 mmol/L (ref 135–145)
Total Bilirubin: 0.5 mg/dL (ref 0.3–1.2)
Total Protein: 6.6 g/dL (ref 6.5–8.1)

## 2022-10-04 LAB — MAGNESIUM: Magnesium: 1.9 mg/dL (ref 1.7–2.4)

## 2022-10-04 MED ORDER — LACTATED RINGERS IV BOLUS
1000.0000 mL | Freq: Once | INTRAVENOUS | Status: AC
  Administered 2022-10-04: 1000 mL via INTRAVENOUS

## 2022-10-04 MED ORDER — OXYCODONE HCL 5 MG PO TABS: 5.0000 mg | ORAL_TABLET | Freq: Four times a day (QID) | ORAL | 0 refills | Status: AC | PRN

## 2022-10-04 MED ORDER — ONDANSETRON 4 MG PO TBDP
8.0000 mg | ORAL_TABLET | Freq: Once | ORAL | Status: AC
  Administered 2022-10-04: 8 mg via ORAL
  Filled 2022-10-04: qty 2

## 2022-10-04 MED ORDER — OXYCODONE-ACETAMINOPHEN 5-325 MG PO TABS
1.0000 | ORAL_TABLET | Freq: Once | ORAL | Status: AC
  Administered 2022-10-04: 1 via ORAL
  Filled 2022-10-04: qty 1

## 2022-10-04 NOTE — ED Triage Notes (Signed)
Pt reports two episodes of muscle spasms in BIL LE over the past hour and a half. Pt called bariatric doctor and was told to come to ED.

## 2022-10-04 NOTE — ED Provider Notes (Cosign Needed Addendum)
[facility] Provider Note   CSN: [id number] Arrival date & time: 10/04/22  2100     History  Chief Complaint  Patient presents with   Spasms    RASHIA MCKESSON is a 42 y.o. female.  42 year old female presents today for cramping to bilateral lower extremities.  This has only occurred twice.  She states she reached out to her bariatric surgeon who recommended she come into the ED for evaluation.  Denies any other complaints.  No leg swelling.  No recent surgery or immobilization.  States typically she drinks plenty of fluids however did not have any significant hydration today.  The history is provided by the patient. No language interpreter was used.       Home Medications Prior to Admission medications   Medication Sig Start Date End Date Taking? Authorizing Provider  cyclobenzaprine (FLEXERIL) 5 MG tablet Take 1-2 tablets (5-10 mg total) by mouth at bedtime as needed for muscle spasms. 07/10/22   Rodolph Bong, MD  Diclofenac Sodium 3 % GEL SMARTSIG:1-2 Gram(s) Topical 3-4 Times Daily 05/17/19   [provider]  hydrocortisone ointment 0.5 % Apply 1 application topically 2 (two) times daily. 08/19/17   Marthenia Rolling, DO  promethazine (PHENERGAN) 25 MG tablet Take 1 tablet (25 mg total) by mouth every 6 (six) hours as needed for nausea or vomiting. 07/10/22   Rodolph Bong, MD  SUMAtriptan (IMITREX) 50 MG tablet Take 1 tablet (50 mg total) by mouth every 2 (two) hours as needed for migraine. May repeat in 2 hours if headache persists or recurs. 03/06/22   Rodolph Bong, MD  triamcinolone cream (KENALOG) 0.1 % Apply 1 application topically 2 (two) times daily. 09/17/17   Garnette Gunner, MD  cetirizine (ZYRTEC) 10 MG tablet Take 1 tablet (10 mg total) by mouth daily. 09/17/17 09/17/17  Garnette Gunner, MD      Allergies    Gabapentin and Hydrocodone-acetaminophen    Review of Systems   Review of Systems  Constitutional:  Negative  for fever.  Musculoskeletal:  Positive for myalgias (Spasms).  All other systems reviewed and are negative.   Physical Exam Updated Vital Signs BP 111/65 (BP Location: Left Arm)   Pulse 64   Temp (!) 97.5 F (36.4 C) (Oral)   Resp 16   Ht 5' 3.5" (1.613 m)   Wt 68.5 kg   LMP 07/13/2019 (Within Days)   SpO2 100%   BMI 26.33 kg/m  Physical Exam Vitals and nursing note reviewed.  Constitutional:      General: She is not in acute distress.    Appearance: Normal appearance. She is not ill-appearing.  HENT:     Head: Normocephalic and atraumatic.     Nose: Nose normal.  Eyes:     Conjunctiva/sclera: Conjunctivae normal.  Cardiovascular:     Rate and Rhythm: Normal rate.  Pulmonary:     Effort: Pulmonary effort is normal. No respiratory distress.  Musculoskeletal:        General: No deformity. Normal range of motion.     Cervical back: Normal range of motion.  Skin:    Findings: No rash.  Neurological:     Mental Status: She is alert.     ED Results / Procedures / Treatments   Labs (all labs ordered are listed, but only abnormal results are displayed) Labs Reviewed  CBC - Abnormal; Notable for the following components:      Result Value  Hemoglobin 11.6 (*)    HCT 35.3 (*)    All other components within normal limits  COMPREHENSIVE METABOLIC PANEL - Abnormal; Notable for the following components:   Calcium 8.8 (*)    All other components within normal limits  MAGNESIUM    EKG None  Radiology No results found.  Procedures Procedures    Medications Ordered in ED Medications  lactated ringers bolus 1,000 mL (1,000 mLs Intravenous New Bag/Given 10/04/22 2230)    ED Course/ Medical Decision Making/ A&P                                 Medical Decision Making Amount and/or Complexity of Data Reviewed Labs: ordered.  Risk Prescription drug management.   42 year old female with past medical history significant for gastric sleeve surgery about a year  ago who presents today for concern of cramping to bilateral lower extremities.  Decreased hydration status as of late.  CBC without leukocytosis.  Hemoglobin of 11.6 which is slightly below baseline but not significantly.  No signs of bleeding.  CMP without acute concerns.  Potassium 4.0.  Magnesium 1.9.  Will provide IV fluids.  Otherwise exam is reassuring.  Discharged in stable condition.   Doubt DVT.  No evidence of asymmetrical swelling.  Patient unable to tolerate NSAIDs due to gastric sleeve surgery.  Will give short course of pain medication for severe breakthrough pain.  Final Clinical Impression(s) / ED Diagnoses Final diagnoses:  Muscle spasm    Rx / DC Orders ED Discharge Orders          Ordered    oxyCODONE (ROXICODONE) 5 MG immediate release tablet  Every 6 hours PRN        10/04/22 2319              Marita Kansas, PA-C 10/04/22 2300    Virgina Norfolk, DO 10/04/22 2310    Marita Kansas, PA-C 10/04/22 2320    Virgina Norfolk, DO 10/05/22 1526

## 2022-10-04 NOTE — Discharge Instructions (Signed)
Your workup today was reassuring.  Electrolytes were normal.  You received IV fluids.  Any concerning symptoms return to the emergency room otherwise follow-up with your primary care provider.

## 2022-10-04 NOTE — ED Notes (Signed)
Pt. Report given to [facility]   Pt. Has approx. of Lactaid Ringers to infuse.

## 2022-10-04 NOTE — ED Notes (Signed)
Pt. Reports she started having leg spasms today in both legs.  Pt. Has been eating watermelon all day.  She reports urinating all day today today also.

## 2022-10-10 ENCOUNTER — Ambulatory Visit: Payer: Medicaid Other | Admitting: Family Medicine

## 2022-10-10 VITALS — BP 96/64 | HR 58 | Ht 63.5 in | Wt 152.0 lb

## 2022-10-10 DIAGNOSIS — M542 Cervicalgia: Secondary | ICD-10-CM

## 2022-10-10 DIAGNOSIS — G43019 Migraine without aura, intractable, without status migrainosus: Secondary | ICD-10-CM | POA: Diagnosis not present

## 2022-10-10 DIAGNOSIS — S060X9D Concussion with loss of consciousness of unspecified duration, subsequent encounter: Secondary | ICD-10-CM | POA: Diagnosis not present

## 2022-10-10 MED ORDER — NORTRIPTYLINE HCL 10 MG PO CAPS: 10.0000 mg | ORAL_CAPSULE | Freq: Every day | ORAL | 1 refills | Status: AC

## 2022-10-10 NOTE — Patient Instructions (Addendum)
Thank you for coming in today.   Let me know how this goes.   Lets try nortriptyline at bedtime daily to prevent headaches.   If not working or too annoying we can try other medicines.   Recheck once we get the results back from both the brain and Cspine MRI.   You should hear from MRI scheduling within 1 week. If you do not hear please let me know.
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
## 2022-11-05 ENCOUNTER — Ambulatory Visit
Admission: RE | Admit: 2022-11-05 | Discharge: 2022-11-05 | Disposition: A | Discharge status: Home / Self Care | Payer: Medicaid Other | Source: Ambulatory Visit | Attending: Family Medicine | Admitting: Family Medicine

## 2022-11-05 DIAGNOSIS — G43019 Migraine without aura, intractable, without status migrainosus: Secondary | ICD-10-CM

## 2022-11-05 DIAGNOSIS — M542 Cervicalgia: Secondary | ICD-10-CM

## 2022-11-05 DIAGNOSIS — S060X9D Concussion with loss of consciousness of unspecified duration, subsequent encounter: Secondary | ICD-10-CM

## 2022-11-06 NOTE — Progress Notes (Signed)
MRI of the brain is normal.  No evidence of tumor or stroke or MS or bleeds.

## 2022-11-06 NOTE — Progress Notes (Signed)
MRI cervical spine shows potentially pinched nerves at several levels and some arthritis changes in the neck.  This certainly could cause neck pain or even headache.  Recommend return to clinic to go over the results in full detail and talk about next steps.

## 2022-11-10 ENCOUNTER — Ambulatory Visit: Payer: Medicaid Other

## 2022-11-10 ENCOUNTER — Ambulatory Visit (INDEPENDENT_AMBULATORY_CARE_PROVIDER_SITE_OTHER): Payer: Medicaid Other

## 2022-11-10 ENCOUNTER — Ambulatory Visit: Payer: Medicaid Other | Admitting: Family Medicine

## 2022-11-10 ENCOUNTER — Encounter: Payer: Self-pay | Admitting: Family Medicine

## 2022-11-10 VITALS — BP 96/68 | HR 75 | Ht 63.5 in | Wt 153.0 lb

## 2022-11-10 DIAGNOSIS — G8929 Other chronic pain: Secondary | ICD-10-CM | POA: Diagnosis not present

## 2022-11-10 DIAGNOSIS — M542 Cervicalgia: Secondary | ICD-10-CM

## 2022-11-10 DIAGNOSIS — M25512 Pain in left shoulder: Secondary | ICD-10-CM
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
## 2022-11-10 NOTE — Patient Instructions (Addendum)
Thank you for coming in today.   Please get an Xray today before you leave   Please call [facility] ([facility]) at [phone] to schedule your spine injection.    Let me know how you are doing after the injection

## 2022-11-11 NOTE — Progress Notes (Signed)
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

## 2022-11-12 ENCOUNTER — Encounter: Payer: Self-pay | Admitting: Family Medicine

## 2022-11-21 ENCOUNTER — Encounter: Payer: Self-pay | Admitting: Family Medicine

## 2022-11-21 ENCOUNTER — Ambulatory Visit: Payer: Medicaid Other | Admitting: Family Medicine

## 2022-11-21 VITALS — BP 86/62 | HR 65 | Ht 63.5 in | Wt 153.0 lb

## 2022-11-21 DIAGNOSIS — I671 Cerebral aneurysm, nonruptured: Secondary | ICD-10-CM

## 2022-11-21 DIAGNOSIS — R55 Syncope and collapse: Secondary | ICD-10-CM

## 2022-11-21 DIAGNOSIS — R5383 Other fatigue: Secondary | ICD-10-CM

## 2022-11-21 DIAGNOSIS — D508 Other iron deficiency anemias: Secondary | ICD-10-CM | POA: Diagnosis not present

## 2022-11-21 HISTORY — DX: Cerebral aneurysm, nonruptured: I67.1

## 2022-11-21 LAB — T4, FREE: Free T4: 0.98 ng/dL (ref 0.60–1.60)

## 2022-11-21 LAB — TSH: TSH: 2.61 u[IU]/mL (ref 0.35–5.50)

## 2022-11-21 NOTE — Progress Notes (Signed)
Subjective:   I, Philbert Riser, PhD, LAT, ATC acting as a scribe for Clementeen Graham, MD.  Chief Complaint: Erika [name],  is a 42 y.o. female who presents for another head injury. Pt had a syncopal event yesterday, while at work, and was transported to the [facility] ED. 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 Today, pt reports the ED thought syncopal episode was a drop in the BP, dehydration, and slight anemia. She did not hit her head when she LOC. Pt c/o HA ongoing since yesterday.   Injury date: 11/20/22 Visit #: 1  History of Present Illness:   Concussion Self-Reported Symptom Score Symptoms rated on a scale 1-6, in last 24 hours  Headache: 0   Pressure in head: 0 Neck pain: 0 Nausea or vomiting: 0 Dizziness: 0  Blurred vision: 0  Balance problems: 0 Sensitivity to light:  0 Sensitivity to noise: 0 Feeling slowed down: 0 Feeling like "in a fog": 0 "Don't feel right": 0 Difficulty concentrating: 0 Difficulty remembering: 0 Fatigue or low energy: 0 Confusion: 0 Drowsiness: 0 More emotional: 0 Irritability: 0 Sadness: 0 Nervous or anxious: 0 Trouble falling asleep: 0   Total # of Symptoms: 0/22 Total Symptom Score: 0/132  Tinnitus: No  Review of Systems: No fevers or chills    Review of History: Bariatric surgery.  History of low blood pressures in the past.  Objective:    Physical Examination Vitals:   11/21/22 0926  BP: (!) 86/62  Pulse: 65  SpO2: 99%   MSK: Normal cervical motion Neuro: Alert and oriented normal coordination and gait Psych: Normal speech thought process and affect.     Imaging:  EXAM: CT ANGIOGRAPHY HEAD AND NECK  TECHNIQUE: Multidetector CT imaging of the head and neck was performed using the standard protocol during bolus administration of intravenous contrast. Multiplanar CT image reconstructions and MIPs were obtained to evaluate the vascular anatomy. Carotid stenosis measurements  (when applicable) are obtained utilizing NASCET criteria, using the distal internal carotid diameter as the denominator.  RADIATION DOSE REDUCTION: This exam was performed according to the departmental dose-optimization program which includes automated exposure control, adjustment of the mA and/or kV according to patient size and/or use of iterative reconstruction technique.  CONTRAST:  80 mL iohexoL (OMNIPAQUE) 350 mg iodine/mL injection (MDV) 80 mL  COMPARISON:  No prior CTA available, correlation is made with CT head 02/28/2022  FINDINGS: CT HEAD FINDINGS  Brain: No evidence of acute infarct, hemorrhage, mass, mass effect, or midline shift. No hydrocephalus or extra-axial fluid collection.  Vascular: No hyperdense vessel.  Skull: Negative for fracture or focal lesion.  Sinuses/Orbits: No acute finding.  Other: Fluid in the right mastoid air cells.  CTA NECK FINDINGS  Aortic arch: Standard branching. Imaged portion shows no evidence of aneurysm or dissection. No significant stenosis of the major arch vessel origins.  Right carotid system: No evidence of stenosis, dissection, or occlusion.  Left carotid system: No evidence of stenosis, dissection, or occlusion.  Vertebral arteries: No evidence of stenosis, dissection, or occlusion.  Skeleton: No acute osseous abnormality.  Other neck: Heterogeneous 2.6 cm nodule in the left thyroid lobe. This was most recently evaluated with ultrasound on 03/12/2022. Otherwise negative.  Upper chest: No focal pulmonary opacity or pleural effusion.  Review of the MIP images confirms the above findings  CTA HEAD FINDINGS  Anterior circulation: Both internal carotid arteries are patent to the termini, without significant stenosis. 1-2 mm laterally directed  outpouching from the left cavernous ICA (series 12, image 221).  A1 segments patent. Normal anterior communicating artery. Anterior cerebral arteries are patent to their  distal aspects without significant stenosis.  No M1 stenosis or occlusion. MCA branches perfused to their distal aspects without significant stenosis.  Posterior circulation: Vertebral arteries patent to the vertebrobasilar junction without significant stenosis.  Basilar patent to its distal aspect without significant stenosis. Superior cerebellar arteries patent proximally.  Patent P1 segments. PCAs perfused to their distal aspects without significant stenosis. Diminutive bilateral posterior communicating arteries.  Venous sinuses: Well opacified, patent.  Anatomic variants: None significant.  No evidence of vascular malformation.  Review of the MIP images confirms the above findings  IMPRESSION: 1. No acute intracranial process. 2. No intracranial large vessel occlusion or significant stenosis. 3. No hemodynamically significant stenosis in the neck. 4. 1-2 mm laterally directed outpouching from the left cavernous ICA, favored to represent a tiny aneurysm.   Electronically Signed   By: Wiliam Ke M.D.   On: 11/20/2022 19:38 Exam End: 11/20/22 18:04   Recent labs: CBC: November 20, 2022 outside hospital.  Hemoglobin 11.3.  RBC 3.9 both low.  MCV normal at 86.5.  Metabolic panel September 19 outside hospital.  Significant for slightly elevated chloride at 108 slightly decreased anion gap at 5.  Normal GFR other electrolytes and normal liver enzymes.   Assessment and Plan   42 y.o. female with syncope fortunately occurring without injury.  She had an excellent workup in the emergency room that was basically unrevealing.  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  Labs significant for slight anemia with low RBC volume.  She does have a history of low blood pressure and I think basically had a vasovagal or orthostatic syncopal event.  She does have established relationship with a cardiologist at [facility]  medical and I encouraged her to follow-up with cardiology.  Plan to check iron stores and TSH to evaluate fatigue and syncope and anemia seen on recent labs.  We talked about the aneurysm.  It is so tiny I do not think it is clinically relevant.  Consider recheck aneurysm in 2 years.       Action/Discussion: Reviewed diagnosis, management options, expected outcomes, and the reasons for scheduled and emergent follow-up. Questions were adequately answered. Patient expressed verbal understanding and agreement with the following plan.     Patient Education: Reviewed with patient the risks (i.e, a repeat concussion, post-concussion syndrome, second-impact syndrome) of returning to play prior to complete resolution, and thoroughly reviewed the signs and symptoms of concussion.Reviewed need for complete resolution of all symptoms, with rest AND exertion, prior to return to play. Reviewed red flags for urgent medical evaluation: worsening symptoms, nausea/vomiting, intractable headache, musculoskeletal changes, focal neurological deficits. [facility] Concussion Care Plan, which clearly outlines the plans stated above, was given to patient.   Level of service: Total encounter time 40 minutes including face-to-face time with the patient and, reviewing past medical record, and charting on the date of service.        After Visit Summary printed out and provided to patient as appropriate.  The above documentation has been reviewed and is accurate and complete Clementeen Graham

## 2022-11-21 NOTE — Patient Instructions (Addendum)
Thank you for coming in today.   I would like to get you in with a cardiologist.  I think you fainted because your blood pressure was too low.   Please get labs today before you leave   Let me know how this goes.   Someone in Sep of 2026 should do another look at your blood vessels in your head to follow that aneurysm.

## 2022-11-22 LAB — IRON,TIBC AND FERRITIN PANEL
%SAT: 28 % (calc) (ref 16–45)
Ferritin: 53 ng/mL (ref 16–232)
Iron: 91 ug/dL (ref 40–190)
TIBC: 320 mcg/dL (calc) (ref 250–450)

## 2022-11-24 NOTE — Progress Notes (Signed)
Labs are okay.  Thigh Royd and iron stores are basically okay.  The iron stores are just a touch low so continue the oral iron.  Is not bad enough that IV iron makes any sense.

## 2022-11-25 ENCOUNTER — Telehealth: Payer: Self-pay
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
## 2022-11-26 NOTE — Telephone Encounter (Signed)
Left message for patient to call back to schedule.  °

## 2022-11-26 NOTE — Telephone Encounter (Signed)
Please reach out to pt to assist with scheduling f/u after neck injection. Dr. Denyse Amass would like to see her back at last once more after the neck injection before releasing her.   Thank you!

## 2022-11-26 NOTE — Telephone Encounter (Signed)
I am very close to saying that you are better.  However I would like to see how you feel after this neck injection.

## 2022-12-09 NOTE — Discharge Instructions (Signed)

## 2022-12-10 ENCOUNTER — Inpatient Hospital Stay
Admission: RE | Admit: 2022-12-10 | Discharge: 2022-12-10 | Disposition: A | Discharge status: Home / Self Care | Payer: Medicaid Other | Source: Ambulatory Visit | Attending: Family Medicine | Admitting: Family Medicine

## 2022-12-10 DIAGNOSIS — M542 Cervicalgia: Secondary | ICD-10-CM

## 2022-12-10 MED ORDER — IOPAMIDOL (ISOVUE-M 300) INJECTION 61%
1.0000 mL | Freq: Once | INTRAMUSCULAR | Status: AC | PRN
  Administered 2022-12-10: 1 mL via EPIDURAL

## 2022-12-10 MED ORDER — TRIAMCINOLONE ACETONIDE 40 MG/ML IJ SUSP (RADIOLOGY)
60.0000 mg | Freq: Once | INTRAMUSCULAR | Status: AC
  Administered 2022-12-10: 60 mg via EPIDURAL

## 2022-12-12 ENCOUNTER — Ambulatory Visit: Payer: Medicaid Other | Admitting: Family Medicine

## 2022-12-17 NOTE — Progress Notes (Unsigned)
   Rubin Payor, PhD, LAT, ATC acting as a scribe for Clementeen Graham, MD.  AVANELLE PIXLEY is a 42 y.o. female who presents to [facility] at [facility] today for f/u after a syncope event. Pt was last seen by Dr. Denyse Amass on 11/21/22 and was encouraged to f/u w/ her cardiologist and additional labs were checked. She also had a cervical ESI on the 9th.  Today, pt reports ***   Pertinent review of systems: ***  Relevant historical information: ***   Exam:  LMP 07/13/2019 (Within Days)  General: Well Developed, well nourished, and in no acute distress.   MSK: ***    Lab and Radiology Results No results found for this or any previous visit (from the past 72 hour(s)). No results found.     Assessment and Plan: 42 y.o. female with ***   PDMP not reviewed this encounter. No orders of the defined types were placed in this encounter.  No orders of the defined types were placed in this encounter.    Discussed warning signs or symptoms. Please see discharge instructions. Patient expresses understanding.   ***

## 2022-12-18 ENCOUNTER — Ambulatory Visit: Payer: Medicaid Other | Admitting: Family Medicine

## 2022-12-18 ENCOUNTER — Encounter: Payer: Self-pay | Admitting: Family Medicine

## 2022-12-18 VITALS — BP 104/70 | HR 70 | Ht 63.5 in | Wt 151.0 lb

## 2022-12-18 DIAGNOSIS — M542 Cervicalgia: Secondary | ICD-10-CM

## 2022-12-18 NOTE — Patient Instructions (Addendum)
Thank you for coming in today.   I will send a letter to your spine doctor Dr Parks Neptune to make sure we are on the same page.   I agree with checking cortisol levels.   Let me know if this is not working.
